# Patient Record
Sex: Male | Born: 1955
Health system: Southern US, Community
[De-identification: ages and names within clinical notes are randomized; demographics above are authoritative.]

## PROBLEM LIST (undated history)

## (undated) DIAGNOSIS — G2581 Restless legs syndrome: Secondary | ICD-10-CM

## (undated) DIAGNOSIS — M545 Low back pain: Secondary | ICD-10-CM

## (undated) DIAGNOSIS — K219 Gastro-esophageal reflux disease without esophagitis: Secondary | ICD-10-CM

## (undated) DIAGNOSIS — G4700 Insomnia, unspecified: Secondary | ICD-10-CM

## (undated) DIAGNOSIS — J329 Chronic sinusitis, unspecified: Secondary | ICD-10-CM

## (undated) DIAGNOSIS — I10 Essential (primary) hypertension: Secondary | ICD-10-CM

## (undated) DIAGNOSIS — R5383 Other fatigue: Secondary | ICD-10-CM

## (undated) DIAGNOSIS — R739 Hyperglycemia, unspecified: Secondary | ICD-10-CM

## (undated) DIAGNOSIS — E785 Hyperlipidemia, unspecified: Secondary | ICD-10-CM

## (undated) DIAGNOSIS — J3489 Other specified disorders of nose and nasal sinuses: Secondary | ICD-10-CM

## (undated) DIAGNOSIS — F419 Anxiety disorder, unspecified: Secondary | ICD-10-CM

## (undated) DIAGNOSIS — Z0000 Encounter for general adult medical examination without abnormal findings: Secondary | ICD-10-CM

## (undated) DIAGNOSIS — T7840XA Allergy, unspecified, initial encounter: Secondary | ICD-10-CM

## (undated) DIAGNOSIS — R55 Syncope and collapse: Secondary | ICD-10-CM

## (undated) DIAGNOSIS — M79672 Pain in left foot: Secondary | ICD-10-CM

## (undated) DIAGNOSIS — I8393 Asymptomatic varicose veins of bilateral lower extremities: Secondary | ICD-10-CM

## (undated) HISTORY — DX: Asymptomatic varicose veins of bilateral lower extremities: I83.93

## (undated) HISTORY — DX: Chronic sinusitis, unspecified: J32.9

## (undated) HISTORY — DX: Insomnia, unspecified: G47.00

## (undated) HISTORY — DX: Low back pain: M54.5

## (undated) HISTORY — DX: Syncope and collapse: R55

## (undated) HISTORY — DX: Anxiety disorder, unspecified: F41.9

## (undated) HISTORY — DX: Restless legs syndrome: G25.81

## (undated) HISTORY — DX: Hyperlipidemia, unspecified: E78.5

## (undated) HISTORY — DX: Gastro-esophageal reflux disease without esophagitis: K21.9

## (undated) HISTORY — DX: Essential (primary) hypertension: I10

## (undated) HISTORY — PX: PANENDOSCOPY: SHX2159

## (undated) HISTORY — DX: Encounter for general adult medical examination without abnormal findings: Z00.00

## (undated) HISTORY — DX: Allergy, unspecified, initial encounter: T78.40XA

## (undated) HISTORY — DX: Other specified disorders of nose and nasal sinuses: J34.89

## (undated) HISTORY — DX: Pain in left foot: M79.672

## (undated) HISTORY — DX: Other fatigue: R53.83

## (undated) HISTORY — PX: TONSILLECTOMY: SUR1361

## (undated) HISTORY — DX: Hyperglycemia, unspecified: R73.9

---

## 2001-02-16 ENCOUNTER — Emergency Department (HOSPITAL_COMMUNITY): Admission: EM | Admit: 2001-02-16 | Discharge: 2001-02-16 | Payer: Self-pay | Admitting: *Deleted

## 2001-02-16 ENCOUNTER — Encounter: Payer: Self-pay | Admitting: Emergency Medicine

## 2004-07-10 ENCOUNTER — Ambulatory Visit: Payer: Self-pay | Admitting: Family Medicine

## 2004-10-18 ENCOUNTER — Ambulatory Visit: Payer: Self-pay | Admitting: Internal Medicine

## 2005-01-07 ENCOUNTER — Ambulatory Visit: Payer: Self-pay | Admitting: Internal Medicine

## 2005-01-16 ENCOUNTER — Encounter (INDEPENDENT_AMBULATORY_CARE_PROVIDER_SITE_OTHER): Payer: Self-pay | Admitting: *Deleted

## 2005-01-16 ENCOUNTER — Ambulatory Visit: Payer: Self-pay | Admitting: Internal Medicine

## 2005-07-18 ENCOUNTER — Ambulatory Visit: Payer: Self-pay | Admitting: Family Medicine

## 2006-01-14 ENCOUNTER — Ambulatory Visit: Payer: Self-pay | Admitting: Family Medicine

## 2006-07-18 ENCOUNTER — Ambulatory Visit: Payer: Self-pay | Admitting: Family Medicine

## 2006-07-18 LAB — CONVERTED CEMR LAB
ALT: 28 units/L (ref 0–40)
AST: 23 units/L (ref 0–37)
Albumin: 3.7 g/dL (ref 3.5–5.2)
Alkaline Phosphatase: 43 units/L (ref 39–117)
BUN: 10 mg/dL (ref 6–23)
Basophils Absolute: 0.1 10*3/uL (ref 0.0–0.1)
Basophils Relative: 0.8 % (ref 0.0–1.0)
CO2: 27 meq/L (ref 19–32)
Calcium: 9.3 mg/dL (ref 8.4–10.5)
Chloride: 105 meq/L (ref 96–112)
Chol/HDL Ratio, serum: 4.7
Cholesterol: 198 mg/dL (ref 0–200)
Creatinine, Ser: 1.2 mg/dL (ref 0.4–1.5)
Eosinophil percent: 4.6 % (ref 0.0–5.0)
GFR calc non Af Amer: 68 mL/min
Glomerular Filtration Rate, Af Am: 82 mL/min/{1.73_m2}
Glucose, Bld: 114 mg/dL — ABNORMAL HIGH (ref 70–99)
HCT: 43.4 % (ref 39.0–52.0)
HDL: 42.1 mg/dL (ref >39.0)
Hemoglobin: 15 g/dL (ref 13.0–17.0)
Hgb A1c MFr Bld: 5.4 % (ref 4.6–6.0)
LDL Cholesterol: 124 mg/dL — ABNORMAL HIGH (ref 0–99)
Lymphocytes Relative: 29 % (ref 12.0–46.0)
MCHC: 34.5 g/dL (ref 30.0–36.0)
MCV: 87 fL (ref 78.0–100.0)
Monocytes Absolute: 0.6 10*3/uL (ref 0.2–0.7)
Monocytes Relative: 8.6 % (ref 3.0–11.0)
Neutro Abs: 3.8 10*3/uL (ref 1.4–7.7)
Neutrophils Relative %: 57 % (ref 43.0–77.0)
PSA: 0.37 ng/mL (ref 0.10–4.00)
Platelets: 297 10*3/uL (ref 150–400)
Potassium: 4.9 meq/L (ref 3.5–5.1)
RBC: 4.99 M/uL (ref 4.22–5.81)
RDW: 13.1 % (ref 11.5–14.6)
Sodium: 139 meq/L (ref 135–145)
TSH: 0.66 microintl units/mL (ref 0.35–5.50)
Total Bilirubin: 0.9 mg/dL (ref 0.3–1.2)
Total Protein: 6.3 g/dL (ref 6.0–8.3)
Triglyceride fasting, serum: 160 mg/dL — ABNORMAL HIGH (ref 0–149)
VLDL: 32 mg/dL (ref 0–40)
WBC: 6.7 10*3/uL (ref 4.5–10.5)

## 2006-07-24 ENCOUNTER — Ambulatory Visit: Payer: Self-pay | Admitting: Family Medicine

## 2006-09-09 ENCOUNTER — Ambulatory Visit: Payer: Self-pay | Admitting: Family Medicine

## 2006-09-11 ENCOUNTER — Ambulatory Visit: Payer: Self-pay | Admitting: Family Medicine

## 2006-10-21 ENCOUNTER — Ambulatory Visit: Payer: Self-pay | Admitting: Family Medicine

## 2007-04-10 DIAGNOSIS — I10 Essential (primary) hypertension: Secondary | ICD-10-CM | POA: Insufficient documentation

## 2007-04-28 ENCOUNTER — Telehealth: Payer: Self-pay | Admitting: Family Medicine

## 2007-06-24 ENCOUNTER — Ambulatory Visit: Payer: Self-pay | Admitting: Family Medicine

## 2007-06-24 LAB — CONVERTED CEMR LAB
Bilirubin Urine: NEGATIVE
Glucose, Urine, Semiquant: 100
Ketones, urine, test strip: NEGATIVE
Nitrite: NEGATIVE
Protein, U semiquant: NEGATIVE
Specific Gravity, Urine: 1.015
Urobilinogen, UA: 0.2
WBC Urine, dipstick: NEGATIVE
pH: 5

## 2007-07-21 ENCOUNTER — Ambulatory Visit: Payer: Self-pay | Admitting: Family Medicine

## 2007-07-21 DIAGNOSIS — M129 Arthropathy, unspecified: Secondary | ICD-10-CM | POA: Insufficient documentation

## 2007-07-21 DIAGNOSIS — J309 Allergic rhinitis, unspecified: Secondary | ICD-10-CM | POA: Insufficient documentation

## 2007-07-21 DIAGNOSIS — R51 Headache: Secondary | ICD-10-CM

## 2007-07-21 DIAGNOSIS — R519 Headache, unspecified: Secondary | ICD-10-CM | POA: Insufficient documentation

## 2007-07-21 LAB — CONVERTED CEMR LAB
ALT: 26 units/L (ref 0–53)
AST: 20 units/L (ref 0–37)
Albumin: 3.9 g/dL (ref 3.5–5.2)
Alkaline Phosphatase: 46 units/L (ref 39–117)
BUN: 11 mg/dL (ref 6–23)
Basophils Absolute: 0.1 10*3/uL (ref 0.0–0.1)
Basophils Relative: 0.9 % (ref 0.0–1.0)
Bilirubin, Direct: 0.2 mg/dL (ref 0.0–0.3)
CO2: 28 meq/L (ref 19–32)
Calcium: 9.4 mg/dL (ref 8.4–10.5)
Chloride: 104 meq/L (ref 96–112)
Cholesterol: 174 mg/dL (ref 0–200)
Creatinine, Ser: 1.1 mg/dL (ref 0.4–1.5)
Eosinophils Absolute: 0.4 10*3/uL (ref 0.0–0.6)
Eosinophils Relative: 5.7 % — ABNORMAL HIGH (ref 0.0–5.0)
GFR calc Af Amer: 90 mL/min
GFR calc non Af Amer: 75 mL/min
Glucose, Bld: 108 mg/dL — ABNORMAL HIGH (ref 70–99)
HCT: 41.3 % (ref 39.0–52.0)
HDL: 38.1 mg/dL — ABNORMAL LOW (ref >39.0)
Hemoglobin: 14.1 g/dL (ref 13.0–17.0)
LDL Cholesterol: 113 mg/dL — ABNORMAL HIGH (ref 0–99)
Lymphocytes Relative: 31.2 % (ref 12.0–46.0)
MCHC: 34.1 g/dL (ref 30.0–36.0)
MCV: 85.7 fL (ref 78.0–100.0)
Monocytes Absolute: 0.6 10*3/uL (ref 0.2–0.7)
Monocytes Relative: 8.3 % (ref 3.0–11.0)
Neutro Abs: 4.3 10*3/uL (ref 1.4–7.7)
Neutrophils Relative %: 53.9 % (ref 43.0–77.0)
PSA: 0.45 ng/mL (ref 0.10–4.00)
Platelets: 261 10*3/uL (ref 150–400)
Potassium: 4.4 meq/L (ref 3.5–5.1)
RBC: 4.82 M/uL (ref 4.22–5.81)
RDW: 13 % (ref 11.5–14.6)
Sodium: 139 meq/L (ref 135–145)
TSH: 0.54 microintl units/mL (ref 0.35–5.50)
Total Bilirubin: 1 mg/dL (ref 0.3–1.2)
Total CHOL/HDL Ratio: 4.6
Total Protein: 6.2 g/dL (ref 6.0–8.3)
Triglycerides: 114 mg/dL (ref 0–149)
VLDL: 23 mg/dL (ref 0–40)
WBC: 7.8 10*3/uL (ref 4.5–10.5)

## 2007-08-07 ENCOUNTER — Telehealth: Payer: Self-pay | Admitting: Family Medicine

## 2007-09-08 ENCOUNTER — Telehealth: Payer: Self-pay | Admitting: Family Medicine

## 2007-11-11 ENCOUNTER — Ambulatory Visit: Payer: Self-pay | Admitting: Family Medicine

## 2007-11-17 ENCOUNTER — Telehealth: Payer: Self-pay | Admitting: Family Medicine

## 2008-03-11 ENCOUNTER — Encounter: Payer: Self-pay | Admitting: Family Medicine

## 2008-03-15 ENCOUNTER — Telehealth: Payer: Self-pay | Admitting: Family Medicine

## 2008-07-20 ENCOUNTER — Ambulatory Visit: Payer: Self-pay | Admitting: Family Medicine

## 2008-07-20 DIAGNOSIS — G4700 Insomnia, unspecified: Secondary | ICD-10-CM

## 2008-07-20 LAB — CONVERTED CEMR LAB
Bilirubin Urine: NEGATIVE
Blood in Urine, dipstick: NEGATIVE
Glucose, Urine, Semiquant: NEGATIVE
Ketones, urine, test strip: NEGATIVE
Nitrite: NEGATIVE
Protein, U semiquant: NEGATIVE
Specific Gravity, Urine: 1.015
Urobilinogen, UA: 0.2
WBC Urine, dipstick: NEGATIVE
pH: 6.5

## 2008-07-24 LAB — CONVERTED CEMR LAB
ALT: 24 units/L (ref 0–53)
AST: 18 units/L (ref 0–37)
Albumin: 4.1 g/dL (ref 3.5–5.2)
Alkaline Phosphatase: 43 units/L (ref 39–117)
BUN: 14 mg/dL (ref 6–23)
Basophils Absolute: 0 10*3/uL (ref 0.0–0.1)
Basophils Relative: 0.2 % (ref 0.0–3.0)
Bilirubin, Direct: 0.1 mg/dL (ref 0.0–0.3)
CO2: 31 meq/L (ref 19–32)
Calcium: 9.4 mg/dL (ref 8.4–10.5)
Chloride: 104 meq/L (ref 96–112)
Cholesterol: 189 mg/dL (ref 0–200)
Creatinine, Ser: 0.8 mg/dL (ref 0.4–1.5)
Eosinophils Absolute: 0.3 10*3/uL (ref 0.0–0.7)
Eosinophils Relative: 4.7 % (ref 0.0–5.0)
GFR calc Af Amer: 130 mL/min
GFR calc non Af Amer: 107 mL/min
Glucose, Bld: 77 mg/dL (ref 70–99)
HCT: 43.1 % (ref 39.0–52.0)
HDL: 45.1 mg/dL (ref >39.0)
Hemoglobin: 15 g/dL (ref 13.0–17.0)
LDL Cholesterol: 107 mg/dL — ABNORMAL HIGH (ref 0–99)
Lymphocytes Relative: 33.6 % (ref 12.0–46.0)
MCHC: 34.8 g/dL (ref 30.0–36.0)
MCV: 84.1 fL (ref 78.0–100.0)
Monocytes Absolute: 0.6 10*3/uL (ref 0.1–1.0)
Monocytes Relative: 8.2 % (ref 3.0–12.0)
Neutro Abs: 3.7 10*3/uL (ref 1.4–7.7)
Neutrophils Relative %: 53.3 % (ref 43.0–77.0)
PSA: 0.49 ng/mL (ref 0.10–4.00)
Platelets: 220 10*3/uL (ref 150–400)
Potassium: 3.9 meq/L (ref 3.5–5.1)
RBC: 5.12 M/uL (ref 4.22–5.81)
RDW: 13.1 % (ref 11.5–14.6)
Sodium: 141 meq/L (ref 135–145)
TSH: 0.5 microintl units/mL (ref 0.35–5.50)
Total Bilirubin: 1.1 mg/dL (ref 0.3–1.2)
Total CHOL/HDL Ratio: 4.2
Total Protein: 6.7 g/dL (ref 6.0–8.3)
Triglycerides: 183 mg/dL — ABNORMAL HIGH (ref 0–149)
VLDL: 37 mg/dL (ref 0–40)
Vit D, 1,25-Dihydroxy: 27 — ABNORMAL LOW (ref 30–89)
WBC: 6.9 10*3/uL (ref 4.5–10.5)

## 2009-01-17 ENCOUNTER — Ambulatory Visit: Payer: Self-pay | Admitting: Family Medicine

## 2009-02-07 ENCOUNTER — Ambulatory Visit: Payer: Self-pay | Admitting: Family Medicine

## 2009-02-07 DIAGNOSIS — M5430 Sciatica, unspecified side: Secondary | ICD-10-CM | POA: Insufficient documentation

## 2009-05-18 ENCOUNTER — Encounter (INDEPENDENT_AMBULATORY_CARE_PROVIDER_SITE_OTHER): Payer: Self-pay | Admitting: *Deleted

## 2009-05-22 ENCOUNTER — Encounter: Payer: Self-pay | Admitting: Family Medicine

## 2009-06-02 ENCOUNTER — Telehealth: Payer: Self-pay | Admitting: *Deleted

## 2009-07-25 ENCOUNTER — Ambulatory Visit: Payer: Self-pay | Admitting: Family Medicine

## 2009-07-25 DIAGNOSIS — R079 Chest pain, unspecified: Secondary | ICD-10-CM | POA: Insufficient documentation

## 2009-07-25 DIAGNOSIS — R05 Cough: Secondary | ICD-10-CM | POA: Insufficient documentation

## 2009-08-16 ENCOUNTER — Encounter: Payer: Self-pay | Admitting: Family Medicine

## 2009-08-16 ENCOUNTER — Ambulatory Visit: Payer: Self-pay

## 2009-08-16 ENCOUNTER — Ambulatory Visit (HOSPITAL_COMMUNITY): Admission: RE | Admit: 2009-08-16 | Discharge: 2009-08-16 | Payer: Self-pay | Admitting: Family Medicine

## 2009-08-16 ENCOUNTER — Ambulatory Visit: Payer: Self-pay | Admitting: Cardiology

## 2010-02-20 ENCOUNTER — Ambulatory Visit: Payer: Self-pay | Admitting: Family Medicine

## 2010-02-20 ENCOUNTER — Telehealth: Payer: Self-pay | Admitting: Family Medicine

## 2010-02-20 DIAGNOSIS — J0100 Acute maxillary sinusitis, unspecified: Secondary | ICD-10-CM

## 2010-02-20 DIAGNOSIS — H6690 Otitis media, unspecified, unspecified ear: Secondary | ICD-10-CM | POA: Insufficient documentation

## 2010-05-14 ENCOUNTER — Ambulatory Visit: Payer: Self-pay | Admitting: Family Medicine

## 2010-05-14 DIAGNOSIS — L03019 Cellulitis of unspecified finger: Secondary | ICD-10-CM

## 2010-05-14 DIAGNOSIS — L02519 Cutaneous abscess of unspecified hand: Secondary | ICD-10-CM | POA: Insufficient documentation

## 2010-06-29 ENCOUNTER — Telehealth: Payer: Self-pay | Admitting: Family Medicine

## 2010-08-26 LAB — CONVERTED CEMR LAB
ALT: 32 units/L (ref 0–53)
AST: 25 units/L (ref 0–37)
Albumin: 4.4 g/dL (ref 3.5–5.2)
Alkaline Phosphatase: 43 units/L (ref 39–117)
BUN: 19 mg/dL (ref 6–23)
Basophils Absolute: 0.1 10*3/uL (ref 0.0–0.1)
Basophils Relative: 0.8 % (ref 0.0–3.0)
Bilirubin Urine: NEGATIVE
Bilirubin, Direct: 0.2 mg/dL (ref 0.0–0.3)
CO2: 29 meq/L (ref 19–32)
Calcium: 9.7 mg/dL (ref 8.4–10.5)
Chloride: 104 meq/L (ref 96–112)
Cholesterol: 209 mg/dL — ABNORMAL HIGH (ref 0–200)
Creatinine, Ser: 1.1 mg/dL (ref 0.4–1.5)
Direct LDL: 116.1 mg/dL
Eosinophils Absolute: 0.3 10*3/uL (ref 0.0–0.7)
Eosinophils Relative: 4.2 % (ref 0.0–5.0)
GFR calc non Af Amer: 73.88 mL/min (ref >60)
Glucose, Bld: 111 mg/dL — ABNORMAL HIGH (ref 70–99)
Glucose, Urine, Semiquant: NEGATIVE
HCT: 43.9 % (ref 39.0–52.0)
HDL: 53.1 mg/dL (ref >39.00)
Hemoglobin: 14.9 g/dL (ref 13.0–17.0)
Ketones, urine, test strip: NEGATIVE
Lymphocytes Relative: 31 % (ref 12.0–46.0)
Lymphs Abs: 2.2 10*3/uL (ref 0.7–4.0)
MCHC: 34 g/dL (ref 30.0–36.0)
MCV: 85.8 fL (ref 78.0–100.0)
Monocytes Absolute: 0.6 10*3/uL (ref 0.1–1.0)
Monocytes Relative: 8.8 % (ref 3.0–12.0)
Neutro Abs: 4 10*3/uL (ref 1.4–7.7)
Neutrophils Relative %: 55.2 % (ref 43.0–77.0)
Nitrite: NEGATIVE
PSA: 0.46 ng/mL (ref 0.10–4.00)
Platelets: 227 10*3/uL (ref 150.0–400.0)
Potassium: 4.7 meq/L (ref 3.5–5.1)
Protein, U semiquant: NEGATIVE
RBC: 5.11 M/uL (ref 4.22–5.81)
RDW: 13.1 % (ref 11.5–14.6)
Sodium: 141 meq/L (ref 135–145)
Specific Gravity, Urine: 1.015
TSH: 0.58 microintl units/mL (ref 0.35–5.50)
Total Bilirubin: 1 mg/dL (ref 0.3–1.2)
Total CHOL/HDL Ratio: 4
Total Protein: 7 g/dL (ref 6.0–8.3)
Triglycerides: 210 mg/dL — ABNORMAL HIGH (ref 0.0–149.0)
Urobilinogen, UA: 0.2
VLDL: 42 mg/dL — ABNORMAL HIGH (ref 0.0–40.0)
Vit D, 25-Hydroxy: 49 ng/mL (ref 30–89)
WBC Urine, dipstick: NEGATIVE
WBC: 7.2 10*3/uL (ref 4.5–10.5)
pH: 6

## 2010-08-28 NOTE — Assessment & Plan Note (Signed)
Summary: sinus inf/njr   Vital Signs:  Patient profile:   55 year old male Weight:      195 pounds O2 Sat:      98 % Temp:     98.3 degrees F Pulse rate:   98 / minute Pulse rhythm:   regular BP sitting:   120 / 90  (left arm)  Vitals Entered By: Pura Spice, RN (February 20, 2010 2:36 PM) CC: sinus infection    History of Present Illness: district 55-year-old white married male is in full for complaints of nasal congestion facial pain and pain in her right ear no sore throat no coughing No other complaint Hypertension well-controlled at 120/90  Allergies (verified): No Known Drug Allergies  Past History:  Past Medical History: Last updated: 07/20/2008 Hypertension ekg   2007 eye exam colonscopy   2006   repeat  2016 DT    2006 PNEUNOVAX SHINGLES VACCINE SMOKER    never    Past Surgical History: Last updated: 04/10/2007 Tonsillectomy Panendoscopy  Social History: Last updated: 04/10/2007 Occupation: Married Never Smoked Alcohol use-no Drug use-no Regular exercise-yes  Risk Factors: Smoking Status: never (04/10/2007)  Review of Systems      See HPI  The patient denies anorexia, fever, weight loss, weight gain, vision loss, decreased hearing, hoarseness, chest pain, syncope, dyspnea on exertion, peripheral edema, prolonged cough, headaches, hemoptysis, abdominal pain, melena, hematochezia, severe indigestion/heartburn, hematuria, incontinence, genital sores, muscle weakness, suspicious skin lesions, transient blindness, difficulty walking, depression, unusual weight change, abnormal bleeding, enlarged lymph nodes, angioedema, breast masses, and testicular masses.    Physical Exam  General:  Well-developed,well-nourished,in no acute distress; alert,appropriate and cooperative throughout examination Head:  Normocephalic and atraumatic without obvious abnormalities. No apparent alopecia or balding. Eyes:  No corneal or conjunctival inflammation noted. EOMI.  Perrla. Funduscopic exam benign, without hemorrhages, exudates or papilledema. Vision grossly normal. Ears:  right TM dull erythematous Left ear negative Nose:  nasal congestion erythematous mucosa with yellow drainage tenderness over back for sinuses Mouth:  Oral mucosa and oropharynx without lesions or exudates.  Teeth in good repair. Lungs:  Normal respiratory effort, chest expands symmetrically. Lungs are clear to auscultation, no crackles or wheezes. Heart:  Normal rate and regular rhythm. S1 and S2 normal without gallop, murmur, click, rub or other extra sounds.   Impression & Recommendations:  Problem # 1:  ACUTE MAXILLARY SINUSITIS (ICD-461.0) Assessment New  His updated medication list for this problem includes:    Allegra-d 12 Hour 60-120 Mg Tb12 (Fexofenadine-pseudoephedrine) .Marland Kitchen... 1 tab every 12 hours as needed for nasal congestion    Hydrocodone-homatropine 5-1.5 Mg/8ml Syrp (Hydrocodone-homatropine) .Marland Kitchen... 1-2  tsp q4h as needed cough    Augmentin 875-125 Mg Tabs (Amoxicillin-pot clavulanate) .Marland Kitchen... 1 bid  Problem # 2:  OTITIS MEDIA (ICD-382.9) Assessment: New  His updated medication list for this problem includes:    Adult Aspirin Low Strength 81 Mg Tbdp (Aspirin)    Augmentin 875-125 Mg Tabs (Amoxicillin-pot clavulanate) .Marland Kitchen... 1 bid  Problem # 3:  HYPERTENSION (ICD-401.9) Assessment: Improved  His updated medication list for this problem includes:    Propranolol Hcl 40 Mg Tabs (Propranolol hcl) .Marland Kitchen... 1 by mouth once daily    Lisinopril 10 Mg Tabs (Lisinopril) .Marland Kitchen... 1 once daily for b:p  Problem # 4:  ARTHRITIS (ICD-716.90) Assessment: Unchanged  Complete Medication List: 1)  Adult Aspirin Low Strength 81 Mg Tbdp (Aspirin) 2)  Zolpidem Tartrate 10 Mg Tabs (Zolpidem tartrate) .... One at bedtime as needed  sleep 3)  Allegra-d 12 Hour 60-120 Mg Tb12 (Fexofenadine-pseudoephedrine) .Marland Kitchen.. 1 tab every 12 hours as needed for nasal congestion 4)  Kls Omperazole 20 Mg Tbec  (Omeprazole) .Marland Kitchen.. 1 capsule daily 5)  Propranolol Hcl 40 Mg Tabs (Propranolol hcl) .Marland Kitchen.. 1 by mouth once daily 6)  Lisinopril 10 Mg Tabs (Lisinopril) .Marland Kitchen.. 1 once daily for b:p 7)  Oxycodone-acetaminophen 10-650 Mg Tabs (Oxycodone-acetaminophen) .Marland Kitchen.. 1 q4h as needed pain 8)  Hydrocodone-homatropine 5-1.5 Mg/49ml Syrp (Hydrocodone-homatropine) .Marland Kitchen.. 1-2  tsp q4h as needed cough 9)  Hydrocodone-acetaminophen 10-650 Mg Tabs (Hydrocodone-acetaminophen) .Marland Kitchen.. 1 q4hy as needed pain 10)  Augmentin 875-125 Mg Tabs (Amoxicillin-pot clavulanate) .Marland Kitchen.. 1 bid  Patient Instructions: 1)  diagnosis otitis media right ear as well as acute maxillary sinusitis 2)  Next Allegra-D b.i.d. as Instructed 3)  Augmentin 4 infection 4)  Good fluid intake Prescriptions: AUGMENTIN 875-125 MG TABS (AMOXICILLIN-POT CLAVULANATE) 1 bid  #20 x 0   Entered and Authorized by:   Judithann Sheen MD   Signed by:   Judithann Sheen MD on 02/20/2010   Method used:   Electronically to        [facility] (retail)       [address]       [place], [place]  [address]       Ph: [phone] or [phone]       Fax: [phone]   RxID:   [id number] HYDROCODONE-ACETAMINOPHEN 10-650 MG TABS (HYDROCODONE-ACETAMINOPHEN) 1 q4hy as needed pain  #100 x 5   Entered and Authorized by:   Judithann Sheen MD   Signed by:   Judithann Sheen MD on 02/20/2010   Method used:   Print then Give to Patient   RxID:   [id number] OXYCODONE-ACETAMINOPHEN 10-650 MG TABS (OXYCODONE-ACETAMINOPHEN) 1 q4h as needed pain  #100 x 0   Entered and Authorized by:   Judithann Sheen MD   Signed by:   Judithann Sheen MD on 02/20/2010   Method used:   Print then Give to Patient   RxID:   [id number]

## 2010-08-28 NOTE — Assessment & Plan Note (Signed)
Summary: HAND INFECTION? (LAC TO THUMB) // RS   Vital Signs:  Patient profile:   55 year old male Height:      72.5 inches (184.15 cm) Weight:      199 pounds (90.45 kg) BMI:     26.71 O2 Sat:      96 % on Room air Temp:     98.1 degrees F (36.72 degrees C) oral Pulse rate:   87 / minute BP sitting:   120 / 80  (left arm) Cuff size:   regular  Vitals Entered By: Josph Macho RMA (May 14, 2010 3:40 PM)  O2 Flow:  Room air CC: Hand infection Left thumb X7 days/ CF Is Patient Diabetic? No   History of Present Illness: Patient is a 55 year old caucasian, nonsmoking male who is in today for evaluation of a laceration on his left thumb. 7 days ago he cut his hand with a kitchen knife while trying to cut a bagel. He did not seek medical care but instead cleaned it with alcohol and peroxide. He has been keeping it covered with antibiotic ointment and bandage and it had been doing well until last night he reports it started to hurt more. The throbbing kept him up even after his Hydrpcodone. Questionable low grade fevers over night. Today it no longer hurts, no fevers and he worked all day although he did note some mild discharge. No Malaise, myalgias, chills, anorexia, CP/palp/SOB or spreading of the area of irritation. No numbness or weakness in hand  Current Medications (verified): 1)  Adult Aspirin Low Strength 81 Mg  Tbdp (Aspirin) 2)  Zolpidem Tartrate 10 Mg  Tabs (Zolpidem Tartrate) .... One At Bedtime As Needed Sleep 3)  Allegra-D 12 Hour 60-120 Mg  Tb12 (Fexofenadine-Pseudoephedrine) .Marland Kitchen.. 1 Tab Every 12 Hours As Needed For Nasal Congestion 4)  Kls Omperazole 20 Mg  Tbec (Omeprazole) .Marland Kitchen.. 1 Capsule Daily 5)  Propranolol Hcl 40 Mg  Tabs (Propranolol Hcl) .Marland Kitchen.. 1 By Mouth Once Daily 6)  Lisinopril 10 Mg Tabs (Lisinopril) .Marland Kitchen.. 1 Once Daily For B:p 7)  Oxycodone-Acetaminophen 10-650 Mg Tabs (Oxycodone-Acetaminophen) .Marland Kitchen.. 1 Q4h As Needed Pain 8)  Hydrocodone-Homatropine 5-1.5 Mg/3ml  Syrp (Hydrocodone-Homatropine) .Marland Kitchen.. 1-2  Tsp Q4h As Needed Cough 9)  Hydrocodone-Acetaminophen 10-650 Mg Tabs (Hydrocodone-Acetaminophen) .Marland Kitchen.. 1 Q4hy As Needed Pain  Allergies (verified): No Known Drug Allergies  Past History:  Past medical history reviewed for relevance to current acute and chronic problems. Social history (including risk factors) reviewed for relevance to current acute and chronic problems.  Past Medical History: Reviewed history from 07/20/2008 and no changes required. Hypertension ekg   2007 eye exam colonscopy   2006   repeat  2016 DT    2006 PNEUNOVAX SHINGLES VACCINE SMOKER    never    Social History: Reviewed history from 04/10/2007 and no changes required. Occupation: Married Never Smoked Alcohol use-no Drug use-no Regular exercise-yes  Review of Systems      See HPI  Physical Exam  General:  Well-developed,well-nourished,in no acute distress; alert,appropriate and cooperative throughout examination Head:  Normocephalic and atraumatic without obvious abnormalities. No apparent alopecia or balding. Neck:  No deformities, masses, or tenderness noted. Lungs:  Normal respiratory effort, chest expands symmetrically. Lungs are clear to auscultation, no crackles or wheezes. Heart:  Normal rate and regular rhythm. S1 and S2 normal without gallop, murmur, click, rub or other extra sounds. Abdomen:  Bowel sounds positive,abdomen soft and non-tender without masses, organomegaly or hernias noted. Extremities:  No clubbing, cyanosis, edema, or deformity noted with normal full range of motion of all joints.   Skin:  left thumb with a cut over his knuckle, small amount of pus around edges. Nontender,  no surrounding erythema, No numbness, weakness in thumb. nailbed pink, perfusing well to tipof thumb. Psych:  Cognition and judgment appear intact. Alert and cooperative with normal attention span and concentration. No apparent delusions, illusions,  hallucinations   Impression & Recommendations:  Problem # 1:  UNSPECIFIED CELLULITIS AND ABSCESS OF FINGER (ICD-681.00)  The following medications were removed from the medication list:    Augmentin 875-125 Mg Tabs (Amoxicillin-pot clavulanate) .Marland Kitchen... 1 bid His updated medication list for this problem includes:    Levaquin 500 Mg Tabs (Levofloxacin) .Marland Kitchen... 1 tab by mouth daily x 10 days  Orders: Orthopedic Surgeon Referral (Ortho Surgeon) Left thumb laceration, asked to soak in H202 and water two times a day and to keep warm and dry, cover with antibiotic ointment and bandaid when out. Set up consultation in case antibiotics ineffective. If lesion improves may cancel consultation. Given Tdap today  Problem # 2:  HYPERTENSION (ICD-401.9)  His updated medication list for this problem includes:    Propranolol Hcl 40 Mg Tabs (Propranolol hcl) .Marland Kitchen... 1/2 tab by mouth two times a day    Lisinopril 10 Mg Tabs (Lisinopril) .Marland Kitchen... 1 by mouth two times a day  Problem # 3:  ALLERGIC RHINITIS (ICD-477.9)  His updated medication list for this problem includes:    Flonase 50 Mcg/act Susp (Fluticasone propionate) .Marland Kitchen... 2 sprays each nostril daily    Fexofenadine Hcl 180 Mg Tabs (Fexofenadine hcl) .Marland Kitchen... 1 tab by mouth daily report worsening  symptoms, has been taking allegra D but asked to stop Sudafed due to HTN  Complete Medication List: 1)  Adult Aspirin Low Strength 81 Mg Tbdp (Aspirin) 2)  Zolpidem Tartrate 10 Mg Tabs (Zolpidem tartrate) .... One at bedtime as needed sleep 3)  Allegra-d 12 Hour 60-120 Mg Tb12 (Fexofenadine-pseudoephedrine) .Marland Kitchen.. 1 tab every 12 hours as needed for nasal congestion 4)  Kls Omperazole 20 Mg Tbec (Omeprazole) .Marland Kitchen.. 1 capsule daily 5)  Propranolol Hcl 40 Mg Tabs (Propranolol hcl) .... 1/2 tab by mouth two times a day 6)  Lisinopril 10 Mg Tabs (Lisinopril) .Marland Kitchen.. 1 by mouth two times a day 7)  Oxycodone-acetaminophen 10-650 Mg Tabs (Oxycodone-acetaminophen) .Marland Kitchen.. 1 q4h as  needed pain 8)  Hydrocodone-homatropine 5-1.5 Mg/83ml Syrp (Hydrocodone-homatropine) .Marland Kitchen.. 1-2  tsp q4h as needed cough 9)  Hydrocodone-acetaminophen 10-650 Mg Tabs (Hydrocodone-acetaminophen) .Marland Kitchen.. 1 q4hy as needed pain 10)  Levaquin 500 Mg Tabs (Levofloxacin) .Marland Kitchen.. 1 tab by mouth daily x 10 days 11)  Naprosyn 375 Mg Tabs (Naproxen) .Marland Kitchen.. 1 tab by mouth two times a day as needed pain with food 12)  Flonase 50 Mcg/act Susp (Fluticasone propionate) .... 2 sprays each nostril daily 13)  Fexofenadine Hcl 180 Mg Tabs (Fexofenadine hcl) .Marland Kitchen.. 1 tab by mouth daily  Patient Instructions: 1)  Take your antibiotic as prescribed until ALL of it is gone, but stop if you develop a rash or swelling and contact our office as soon as possible.  2)  Please schedule a follow-up appointment as needed if symptoms worsen or do not improve, we have arranged an orthopaedic consult  in case the infection worsens to have this debrided, seek immediate medical care if hi grade fever, spreading lesions or increasing pain. 3)  Soak lesion in hydrogen peroxide and hot water two times a day  and keep open to air when home and resting and cover with antibiotic ointment and sterile bandage when out. Prescriptions: FEXOFENADINE HCL 180 MG TABS (FEXOFENADINE HCL) 1 tab by mouth daily  #30 x 3   Entered and Authorized by:   Danise Edge MD   Signed by:   Danise Edge MD on 05/14/2010   Method used:   Electronically to        [facility]* (retail)       [address]       [place], [place]  [address]       Ph: [phone]       Fax: [phone]   RxID:   [id number] FLONASE 50 MCG/ACT SUSP (FLUTICASONE PROPIONATE) 2 sprays each nostril daily  #1 x 3   Entered and Authorized by:   Danise Edge MD   Signed by:   Danise Edge MD on 05/14/2010   Method used:   Electronically to        [facility]* (retail)       [address]       [place], [place]  [address]       Ph: [phone]       Fax: [phone]   RxID:    [id number] LISINOPRIL 10 MG TABS (LISINOPRIL) 1 by mouth two times a day  #60 x 2   Entered and Authorized by:   Danise Edge MD   Signed by:   Danise Edge MD on 05/14/2010   Method used:   Electronically to        [facility]* (retail)       [address]       [place], [place]  [address]       Ph: [phone]       Fax: [phone]   RxID:   [id number] NAPROSYN 375 MG TABS (NAPROXEN) 1 tab by mouth two times a day as needed pain with food  #60 x 2   Entered and Authorized by:   Danise Edge MD   Signed by:   Danise Edge MD on 05/14/2010   Method used:   Electronically to        [facility]* (retail)       [address]       [place], [place]  [address]       Ph: [phone]       Fax: [phone]   RxID:   [id number] LEVAQUIN 500 MG TABS (LEVOFLOXACIN) 1 tab by mouth daily x 10 days  #10 x 0   Entered and Authorized by:   Danise Edge MD   Signed by:   Danise Edge MD on 05/14/2010   Method used:   Electronically to        [facility]* (retail)       [address]       [place], [place]  [address]       Ph: [phone]       Fax: [phone]   RxID:   [id number]    Orders Added: 1)  Orthopedic Surgeon Referral Gaylord Shih Surgeon] 2)  Est. Patient Level IV [[address]]     Appended Document: HAND INFECTION? (LAC TO THUMB) // RS         Immunizations Administered:  Tetanus Vaccine:    Vaccine Type: Tdap    Site: left deltoid    Mfr: GlaxoSmithKline    Dose: 0.5 ml    Route: IM    Given by: Josph Macho RMA    Exp. Date: 05/17/2012  Lot #: [id number]    VIS given: 06/15/08 version given May 15, 2010.

## 2010-08-28 NOTE — Progress Notes (Signed)
Summary: Sinus Infection  Phone Note Call from Patient Call back at Home Phone [phone] Call back at [phone]   Caller: Patient Summary of Call: Wants to get a prescription for amoxicillian, is having sinus infection,fever,head ache. Drugstore is [facility] on [address] Initial call taken by: Georgian Co,  February 20, 2010 9:04 AM  Follow-up for Phone Call        ov today at 3 or 4  per dr Scotty Court  Follow-up by: Pura Spice, RN,  February 20, 2010 9:42 AM  Additional Follow-up for Phone Call Additional follow up Details #1::        ov today 2pm Additional Follow-up by: Heron Sabins,  February 20, 2010 12:27 PM

## 2010-08-30 NOTE — Progress Notes (Signed)
Summary: Rx request for sinus infection  Phone Note Call from Patient   Caller: Patient Call For: Judithann Sheen MD/Suandrea Summary of Call: VM from pt requesting Rx be sent to [facility] for a sinus infection.  "Dr Scotty Court will usually call me in something". Initial call taken by: Sid Falcon LPN,  June 29, 2010 11:26 AM  Follow-up for Phone Call        to talk with patient

## 2010-10-08 ENCOUNTER — Encounter: Payer: Self-pay | Admitting: Family Medicine

## 2010-10-18 ENCOUNTER — Other Ambulatory Visit (INDEPENDENT_AMBULATORY_CARE_PROVIDER_SITE_OTHER): Payer: BC Managed Care – PPO | Admitting: Family Medicine

## 2010-10-18 DIAGNOSIS — E785 Hyperlipidemia, unspecified: Secondary | ICD-10-CM

## 2010-10-18 DIAGNOSIS — Z0000 Encounter for general adult medical examination without abnormal findings: Secondary | ICD-10-CM

## 2010-10-18 LAB — CBC WITH DIFFERENTIAL/PLATELET
Basophils Relative: 0.7 % (ref 0.0–3.0)
Eosinophils Absolute: 0.3 10*3/uL (ref 0.0–0.7)
Eosinophils Relative: 3.4 % (ref 0.0–5.0)
Hemoglobin: 14.1 g/dL (ref 13.0–17.0)
Lymphocytes Relative: 31.7 % (ref 12.0–46.0)
MCHC: 34.3 g/dL (ref 30.0–36.0)
Monocytes Relative: 8.2 % (ref 3.0–12.0)
Neutro Abs: 4.2 10*3/uL (ref 1.4–7.7)
Neutrophils Relative %: 56 % (ref 43.0–77.0)
RBC: 4.79 Mil/uL (ref 4.22–5.81)
WBC: 7.5 10*3/uL (ref 4.5–10.5)

## 2010-10-18 LAB — BASIC METABOLIC PANEL
CO2: 27 mEq/L (ref 19–32)
Calcium: 9.1 mg/dL (ref 8.4–10.5)
Creatinine, Ser: 1 mg/dL (ref 0.4–1.5)
Sodium: 135 mEq/L (ref 135–145)

## 2010-10-18 LAB — POCT URINALYSIS DIPSTICK
Glucose, UA: NEGATIVE
Ketones, UA: NEGATIVE
Leukocytes, UA: NEGATIVE
Protein, UA: NEGATIVE
Spec Grav, UA: 1.01

## 2010-10-18 LAB — HEPATIC FUNCTION PANEL
ALT: 19 U/L (ref 0–53)
AST: 18 U/L (ref 0–37)
Albumin: 4.1 g/dL (ref 3.5–5.2)
Alkaline Phosphatase: 39 U/L (ref 39–117)
Bilirubin, Direct: 0.1 mg/dL (ref 0.0–0.3)
Total Protein: 6.7 g/dL (ref 6.0–8.3)

## 2010-10-18 LAB — TSH: TSH: 0.51 u[IU]/mL (ref 0.35–5.50)

## 2010-10-18 LAB — PSA: PSA: 0.51 ng/mL (ref 0.10–4.00)

## 2010-10-18 LAB — LDL CHOLESTEROL, DIRECT: Direct LDL: 126.6 mg/dL

## 2010-10-23 ENCOUNTER — Ambulatory Visit (INDEPENDENT_AMBULATORY_CARE_PROVIDER_SITE_OTHER): Payer: BC Managed Care – PPO | Admitting: Family Medicine

## 2010-10-23 ENCOUNTER — Encounter: Payer: Self-pay | Admitting: Family Medicine

## 2010-10-23 VITALS — BP 120/90 | HR 66 | Temp 97.9°F | Ht 73.5 in | Wt 200.0 lb

## 2010-10-23 DIAGNOSIS — G4700 Insomnia, unspecified: Secondary | ICD-10-CM

## 2010-10-23 DIAGNOSIS — I10 Essential (primary) hypertension: Secondary | ICD-10-CM

## 2010-10-23 DIAGNOSIS — Z0000 Encounter for general adult medical examination without abnormal findings: Secondary | ICD-10-CM

## 2010-10-23 DIAGNOSIS — J309 Allergic rhinitis, unspecified: Secondary | ICD-10-CM

## 2010-10-23 DIAGNOSIS — R05 Cough: Secondary | ICD-10-CM

## 2010-10-23 MED ORDER — HYDROCODONE-ACETAMINOPHEN 10-650 MG PO TABS: 1.0000 | ORAL_TABLET | ORAL | Status: DC | PRN

## 2010-10-23 MED ORDER — LISINOPRIL 10 MG PO TABS: 20.0000 mg | ORAL_TABLET | Freq: Two times a day (BID) | ORAL | Status: DC

## 2010-10-23 MED ORDER — FLUTICASONE PROPIONATE 50 MCG/ACT NA SUSP: 2.0000 | Freq: Every day | NASAL | Status: DC

## 2010-10-23 MED ORDER — PROPRANOLOL HCL 40 MG PO TABS: 40.0000 mg | ORAL_TABLET | Freq: Three times a day (TID) | ORAL | Status: DC

## 2010-10-23 MED ORDER — OMEPRAZOLE 20 MG PO TBEC: 1.0000 | DELAYED_RELEASE_TABLET | Freq: Every day | ORAL | Status: DC

## 2010-10-23 NOTE — Patient Instructions (Signed)
You are in good health, continue medications as we discussed Lab studies good Return as needed or in 1 yr for physical Check BP atleast every 3 monthsby mother who  Is a nurse

## 2010-11-18 NOTE — Progress Notes (Signed)
  Subjective:    Patient ID: Taylor Mcdonald, male    DOB: 01-21-1956, 55 y.o.   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 He continues to have chronic back pain and at times needs hydrocodoneContinues to have problems with anxiety at times as well as insomniaHPI    Review of Systems  Constitutional: Negative.   HENT: Positive for congestion.   Eyes: Negative.   Respiratory: Positive for cough.   Cardiovascular: Negative.        Hypertension controlled One year ago had stress EKG with normal results  Gastrointestinal: Negative.   Genitourinary: Negative.   Musculoskeletal: Positive for back pain and arthralgias.  Skin: Negative.   Neurological: Negative.   Hematological: Negative.   Psychiatric/Behavioral: Positive for sleep disturbance.       Objective:   Physical Exam the patient is a well-developed well-nourished healthy-appearing pleasant male who has normal vital signs HEENT revealed pale boggy nasal mucosa with minimal clear drainage  carotid pulses are good thyroid nonpalpable Heart no gross cardiomegaly heart sounds go without murmurs rhythm regular no PVCs  peripheral pulses good Lungs clear to palpation percussion and auscultation Abdomen liver spleen and kidneys nonpalpable no masses felt there were percussive normal no tenderness bowel sounds normal Genitalia normal no hernia testicles normal rectal examination revealed normal size prostate no tenderness Back examination negative Extremities negative examination no edema Skin no polyps no nevi Neurological negative examination reflexes normal equal bilaterally        Assessment & Plan:  Physical examination reveals a normal healthy male with no abnormalities noted at this time except for mild allergic rhinitis cough most likely due to postnasal drainage from  allergy will prescribe Hycodan for cough as needed hypertension is controlled no change in meds Chronic back pain prescribed hydrocodone to use as needed for pain GERD continue omeprazole Insomnia continue Ambien as needed

## 2010-11-22 ENCOUNTER — Other Ambulatory Visit: Payer: Self-pay

## 2010-11-22 MED ORDER — PROPRANOLOL HCL 40 MG PO TABS: 40.0000 mg | ORAL_TABLET | Freq: Every day | ORAL | Status: DC

## 2010-11-22 MED ORDER — INDOMETHACIN 50 MG PO CAPS: ORAL_CAPSULE | ORAL | Status: DC

## 2010-11-22 NOTE — Telephone Encounter (Signed)
Received a message from the pt about medication propanolol and gout medication that he needs called in to his pharmacy

## 2011-01-03 ENCOUNTER — Telehealth: Payer: Self-pay | Admitting: Family Medicine

## 2011-01-03 MED ORDER — LISINOPRIL 20 MG PO TABS: 20.0000 mg | ORAL_TABLET | Freq: Every day | ORAL | Status: DC

## 2011-01-03 NOTE — Telephone Encounter (Signed)
Pt called and said that Dr Scotty Court had changed dosage for Lisinopril 10 mg bid, but this causes pt to run out early and pharmacy will not refill. Pt is req that Dr Scotty Court rewrite script for Lisinopril 20 mg 1 per day, so pt could just cut pill in 1/2. Pls call in new script to [facility] [address]

## 2011-01-04 NOTE — Telephone Encounter (Signed)
Done

## 2011-02-26 ENCOUNTER — Ambulatory Visit (INDEPENDENT_AMBULATORY_CARE_PROVIDER_SITE_OTHER): Payer: BC Managed Care – PPO | Admitting: Family Medicine

## 2011-02-26 ENCOUNTER — Encounter: Payer: Self-pay | Admitting: Family Medicine

## 2011-02-26 ENCOUNTER — Ambulatory Visit (INDEPENDENT_AMBULATORY_CARE_PROVIDER_SITE_OTHER)
Admission: RE | Admit: 2011-02-26 | Discharge: 2011-02-26 | Disposition: A | Discharge status: Home / Self Care | Payer: BC Managed Care – PPO | Source: Ambulatory Visit | Attending: Family Medicine | Admitting: Family Medicine

## 2011-02-26 VITALS — BP 118/88 | Temp 98.6°F | Wt 183.0 lb

## 2011-02-26 DIAGNOSIS — M109 Gout, unspecified: Secondary | ICD-10-CM

## 2011-02-26 DIAGNOSIS — R0609 Other forms of dyspnea: Secondary | ICD-10-CM

## 2011-02-26 DIAGNOSIS — R5383 Other fatigue: Secondary | ICD-10-CM

## 2011-02-26 DIAGNOSIS — R0989 Other specified symptoms and signs involving the circulatory and respiratory systems: Secondary | ICD-10-CM

## 2011-02-26 DIAGNOSIS — E538 Deficiency of other specified B group vitamins: Secondary | ICD-10-CM

## 2011-02-26 DIAGNOSIS — R5381 Other malaise: Secondary | ICD-10-CM

## 2011-02-26 DIAGNOSIS — I10 Essential (primary) hypertension: Secondary | ICD-10-CM

## 2011-02-26 LAB — POCT URINALYSIS DIPSTICK
Ketones, UA: NEGATIVE
Leukocytes, UA: NEGATIVE
Protein, UA: NEGATIVE
Spec Grav, UA: 1.015
Urobilinogen, UA: 0.2
pH, UA: 5

## 2011-02-26 LAB — CBC WITH DIFFERENTIAL/PLATELET
Basophils Absolute: 0.1 10*3/uL (ref 0.0–0.1)
Basophils Relative: 0.7 % (ref 0.0–3.0)
Eosinophils Absolute: 0.2 10*3/uL (ref 0.0–0.7)
Lymphocytes Relative: 34.2 % (ref 12.0–46.0)
MCHC: 33.5 g/dL (ref 30.0–36.0)
MCV: 87.1 fl (ref 78.0–100.0)
Monocytes Absolute: 0.7 10*3/uL (ref 0.1–1.0)
Neutrophils Relative %: 55.3 % (ref 43.0–77.0)
RBC: 5.12 Mil/uL (ref 4.22–5.81)
RDW: 14.2 % (ref 11.5–14.6)

## 2011-02-26 LAB — BASIC METABOLIC PANEL
BUN: 15 mg/dL (ref 6–23)
CO2: 27 mEq/L (ref 19–32)
Calcium: 9.3 mg/dL (ref 8.4–10.5)
Chloride: 102 mEq/L (ref 96–112)
Creatinine, Ser: 1.1 mg/dL (ref 0.4–1.5)
Glucose, Bld: 101 mg/dL — ABNORMAL HIGH (ref 70–99)

## 2011-02-26 NOTE — Patient Instructions (Addendum)
Since you have  fatigue and tiredness we will check complete blood count iron level also we would check a testosterone level to cover moods of symptoms that we discussed. As far as the shortness of breath am also going get a chest x-ray and if the lab studies and chest x-ray do not help as far as the shortness of breath we will repeat the stress test I will call you just as soon as I get the results Increase the lisinopril to 20 mg each day and change the propranolol to 20 mg in the a.m. 20 mg at night next her blood pressure on a regular basis and if any question please call For the chest x-ray go to [facility] on [address] between 8:30 and 4:30., No appointment is needed

## 2011-02-27 ENCOUNTER — Ambulatory Visit (INDEPENDENT_AMBULATORY_CARE_PROVIDER_SITE_OTHER): Payer: BC Managed Care – PPO | Admitting: Family Medicine

## 2011-02-27 ENCOUNTER — Other Ambulatory Visit: Payer: Self-pay | Admitting: Family Medicine

## 2011-02-27 DIAGNOSIS — E538 Deficiency of other specified B group vitamins: Secondary | ICD-10-CM

## 2011-02-27 MED ORDER — CYANOCOBALAMIN 1000 MCG/ML IJ SOLN
1000.0000 ug | Freq: Once | INTRAMUSCULAR | Status: AC
  Administered 2011-02-27: 1000 ug via INTRAMUSCULAR

## 2011-02-27 MED ORDER — TESTOSTERONE 20.25 MG/ACT (1.62%) TD GEL: 2.0000 | Freq: Every day | TRANSDERMAL | Status: DC

## 2011-02-27 NOTE — Telephone Encounter (Signed)
Pt aware of labs results.  Pt is scheduled for a b12 inj on 02/27/11 at 4pm and his chest x-ray will be dicussed as well.

## 2011-02-27 NOTE — Telephone Encounter (Signed)
Pt called 8/1. Saw Dr. Scotty Court yesterday and was expecting his call today regarding lab & x-ray results. Pt was just checking to see if Dr. Kathie Rhodes was going to call him. Please call when possible.

## 2011-02-27 NOTE — Progress Notes (Signed)
Quick Note:  Pt aware ______ 

## 2011-02-28 ENCOUNTER — Ambulatory Visit: Payer: BC Managed Care – PPO | Admitting: Family Medicine

## 2011-03-04 NOTE — Progress Notes (Signed)
Pt aware.

## 2011-03-13 ENCOUNTER — Other Ambulatory Visit: Payer: Self-pay | Admitting: Family Medicine

## 2011-03-13 DIAGNOSIS — M545 Low back pain: Secondary | ICD-10-CM

## 2011-03-14 ENCOUNTER — Encounter: Payer: Self-pay | Admitting: Family Medicine

## 2011-03-14 NOTE — Progress Notes (Signed)
  Subjective:    Patient ID: Taylor Mcdonald, male    DOB: 09-26-1955, 55 y.o.   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 Blood pressure has been well-controlled in fact runs low at times today is 118/88, to change the dosage on propranolol to 20 mg twice a day continues to have some insomnia which is helped with Ambien. Plan to get chest x-ray, no other symptoms on systems review HPI    Review of Systems see history of present illness     Objective:   Physical Exam number cooperative HEENT some nasal congestion boggy pale mucosa secondary to allergies Heart normal size rhythm no murmurs peripheral pulses are equal bilaterally lungs are clear palpation percussion and auscultation no rales no wheezing no dullness no tenderness no masses bowel sounds normal Extremities negative no edema          Assessment & Plan:  Fatigue to change propranolol to 20 mg twice a day to see if that affects fatigue also to get testosterone level Attention well controlled Shortness of breath to get chest x-ray Gout control with indomethacin to get uric acid level  future

## 2011-03-21 ENCOUNTER — Telehealth: Payer: Self-pay | Admitting: Family Medicine

## 2011-03-21 NOTE — Telephone Encounter (Signed)
Pt requesting x-rays to take to his orthopedic appt with Dr. Shelle Iron. Please contact.

## 2011-03-27 ENCOUNTER — Ambulatory Visit (INDEPENDENT_AMBULATORY_CARE_PROVIDER_SITE_OTHER): Payer: BC Managed Care – PPO | Admitting: Family Medicine

## 2011-03-27 DIAGNOSIS — E538 Deficiency of other specified B group vitamins: Secondary | ICD-10-CM

## 2011-03-27 MED ORDER — CYANOCOBALAMIN 1000 MCG/ML IJ SOLN: 1000.0000 ug | Freq: Once | INTRAMUSCULAR | Status: DC

## 2011-03-28 ENCOUNTER — Telehealth: Payer: Self-pay

## 2011-03-28 DIAGNOSIS — G4730 Sleep apnea, unspecified: Secondary | ICD-10-CM

## 2011-03-28 NOTE — Telephone Encounter (Signed)
Pt called and states he thinks he may have sleep apnea.  Pt states he has been taking ambien 1/2 tablet and he is still not sleeping; pt states this issue is getting worse.  Pt states he is still tired and would like to have a sleep study done.  Pls advise.

## 2011-03-29 NOTE — Telephone Encounter (Signed)
2 schedule sleep study

## 2011-04-02 ENCOUNTER — Other Ambulatory Visit: Payer: Self-pay | Admitting: Family Medicine

## 2011-04-02 DIAGNOSIS — G4730 Sleep apnea, unspecified: Secondary | ICD-10-CM

## 2011-04-02 NOTE — Telephone Encounter (Signed)
Ok per Dr. Scotty Court to send referral for sleep study.

## 2011-04-29 ENCOUNTER — Other Ambulatory Visit: Payer: Self-pay | Admitting: Family Medicine

## 2011-05-10 ENCOUNTER — Ambulatory Visit (HOSPITAL_BASED_OUTPATIENT_CLINIC_OR_DEPARTMENT_OTHER): Payer: BC Managed Care – PPO | Attending: Family Medicine

## 2011-05-10 DIAGNOSIS — G4730 Sleep apnea, unspecified: Secondary | ICD-10-CM

## 2011-05-10 DIAGNOSIS — G4710 Hypersomnia, unspecified: Secondary | ICD-10-CM | POA: Insufficient documentation

## 2011-05-17 DIAGNOSIS — G4710 Hypersomnia, unspecified: Secondary | ICD-10-CM

## 2011-05-17 DIAGNOSIS — G4730 Sleep apnea, unspecified: Secondary | ICD-10-CM

## 2011-05-17 NOTE — Procedures (Signed)
NAMEELLIJAH, LEFFEL              ACCOUNT NO.:  [id number]  MEDICAL RECORD NO.:  [id number]          PATIENT TYPE:  OUT  LOCATION:  SLEEP CENTER                 FACILITY:  [facility]  PHYSICIAN:  Barbaraann Share, MD,FCCPDATE OF BIRTH:  08/07/[birth year]  DATE OF STUDY:  05/10/2011                           NOCTURNAL POLYSOMNOGRAM  REFERRING PHYSICIAN:  Ellin Saba., MD  INDICATION FOR STUDY:  Hypersomnia with sleep apnea.  EPWORTH SLEEPINESS SCORE:  10.  MEDICATIONS:  SLEEP ARCHITECTURE:  The patient had a total sleep time of 276 minutes with no slow-wave sleep and decreased quantity of REM.  Sleep onset latency was slightly above normal at 34 minutes, and REM onset was at the upper limits of normal at 125 minutes.  Sleep efficiency was moderately reduced at 77%.  RESPIRATORY DATA:  The patient was found to have 1 central apnea and only 9 obstructive hypopneas, giving him an apnea-hypopnea index of 2 events per hour.  The events occurred in all body positions, but were more prominent during REM.  There was moderate snoring noted throughout.  OXYGEN DATA:  There was O2 desaturation as low as 90% with the patient's obstructive events.  CARDIAC DATA:  Rare PVC noted, but no clinically significant arrhythmias were seen.  MOVEMENT-PARASOMNIA:  The patient was found to have 285 periodic leg movements, but only 1 per hour that resulted in arousal or awakening. There were no abnormal behavior seen.  IMPRESSIONS-RECOMMENDATIONS: 1. Small numbers of obstructive events, which do not meet the     apnea/hypopnea index criteria for the obstructive sleep apnea     syndrome. 2. Rare premature ventricular contraction noted, but no clinically     significant arrhythmias were seen. 3. Very large numbers of leg jerks, but only minimal sleep disruption     noted.  However, given the patient's significant sleep difficulties     by history, clinical correlation is recommended.  The patient  may     have a primary movement disorder of sleep such as the restless legs     syndrome, chronic pain, or possibly a neurologic reason     for the above leg movements.  If his history is consistent with the     restless legs syndrome, perhaps a trial of a dopamine agonist would     be in order.     Barbaraann Share, MD,FCCP Diplomate, American Board of Sleep Medicine Electronically Signed    [facility]  D:  05/17/2011 09:45:01  T:  05/17/2011 14:55:59  Job:  [id number]

## 2011-09-16 ENCOUNTER — Encounter: Payer: Self-pay | Admitting: Family Medicine

## 2011-09-16 ENCOUNTER — Ambulatory Visit (INDEPENDENT_AMBULATORY_CARE_PROVIDER_SITE_OTHER): Payer: BC Managed Care – PPO | Admitting: Family Medicine

## 2011-09-16 DIAGNOSIS — E785 Hyperlipidemia, unspecified: Secondary | ICD-10-CM | POA: Insufficient documentation

## 2011-09-16 DIAGNOSIS — R739 Hyperglycemia, unspecified: Secondary | ICD-10-CM

## 2011-09-16 DIAGNOSIS — T7840XA Allergy, unspecified, initial encounter: Secondary | ICD-10-CM | POA: Insufficient documentation

## 2011-09-16 DIAGNOSIS — M109 Gout, unspecified: Secondary | ICD-10-CM

## 2011-09-16 DIAGNOSIS — K219 Gastro-esophageal reflux disease without esophagitis: Secondary | ICD-10-CM | POA: Insufficient documentation

## 2011-09-16 DIAGNOSIS — E291 Testicular hypofunction: Secondary | ICD-10-CM

## 2011-09-16 DIAGNOSIS — E349 Endocrine disorder, unspecified: Secondary | ICD-10-CM | POA: Insufficient documentation

## 2011-09-16 DIAGNOSIS — I10 Essential (primary) hypertension: Secondary | ICD-10-CM | POA: Insufficient documentation

## 2011-09-16 DIAGNOSIS — R52 Pain, unspecified: Secondary | ICD-10-CM

## 2011-09-16 DIAGNOSIS — G4700 Insomnia, unspecified: Secondary | ICD-10-CM

## 2011-09-16 DIAGNOSIS — R7309 Other abnormal glucose: Secondary | ICD-10-CM

## 2011-09-16 DIAGNOSIS — G43909 Migraine, unspecified, not intractable, without status migrainosus: Secondary | ICD-10-CM

## 2011-09-16 DIAGNOSIS — R5383 Other fatigue: Secondary | ICD-10-CM

## 2011-09-16 DIAGNOSIS — G2581 Restless legs syndrome: Secondary | ICD-10-CM

## 2011-09-16 MED ORDER — HYDROCODONE-ACETAMINOPHEN 10-650 MG PO TABS: 1.0000 | ORAL_TABLET | ORAL | Status: DC | PRN

## 2011-09-16 MED ORDER — ZOLPIDEM TARTRATE 10 MG PO TABS: 10.0000 mg | ORAL_TABLET | Freq: Every evening | ORAL | Status: DC | PRN

## 2011-09-16 MED ORDER — HYDROCODONE-HOMATROPINE 5-1.5 MG/5ML PO SYRP: 5.0000 mL | ORAL_SOLUTION | ORAL | Status: DC | PRN

## 2011-09-16 NOTE — Assessment & Plan Note (Signed)
Maintain adequate hydration and check a uric acid level

## 2011-09-16 NOTE — Assessment & Plan Note (Signed)
Recommend otc antihistamines and nasal saline

## 2011-09-16 NOTE — Assessment & Plan Note (Signed)
Multifactorial will check on sugar, tsh and testosterone

## 2011-09-16 NOTE — Assessment & Plan Note (Signed)
Infrequent but does respond to Hydrocodone prn. Encouraged adequate sleep, exercise, hydration and po intake

## 2011-09-16 NOTE — Assessment & Plan Note (Signed)
Well controlled today no change in medications. 

## 2011-09-16 NOTE — Assessment & Plan Note (Signed)
Sleep study confirmed significant RLS movements, will continue Ambien prn and patient may consider switch to RLS meds after school ends

## 2011-09-16 NOTE — Assessment & Plan Note (Signed)
Check a hgba1c with next blood draw, minimize simple carbs.

## 2011-09-16 NOTE — Assessment & Plan Note (Signed)
Stopped his testosterone treatment for the month of January but has restated it, he notes his energy level is better when he is on it. Will recheck levels with next blood draw

## 2011-09-16 NOTE — Progress Notes (Signed)
Patient ID: Taylor Mcdonald, male   DOB: [birth year], 56 y.o.   MRN: [id number] Taylor Mcdonald [id number] [birth year] 09/16/2011      Progress Note New Patient  Subjective  Chief Complaint  Chief Complaint  Patient presents with  . Establish Care    HPI  Patient is a 56 year old Caucasian male in today for new patient appointment. He is in with migraines and manages somewhat worse it does not need them frequently. He's had no recent illness, fevers, chills, chest pain, palpitations, shortness of breath, GI or GU c/o. He follows with Dr Mayford Knife of dermatology. He does struggle with insomnia at times. No dietary restrictions  Past Medical History  Diagnosis Date  . Gout   . Hypertension   . Low testosterone   . Migraine   . Allergy   . RLS (restless legs syndrome)   . Insomnia   . Hyperlipidemia   . GERD (gastroesophageal reflux disease)   . Hyperglycemia   . Fatigue     Past Surgical History  Procedure Date  . Tonsillectomy   . Panendoscopy     Family History  Problem Relation Age of Onset  . Obesity Mother   . Hypertension Son   . Hypertension Maternal Grandmother   . Obesity Maternal Grandmother   . Cancer Maternal Grandmother 50    intestinal  . Dementia Maternal Grandfather     History   Social History  . Marital Status: Married    Spouse Name: N/A    Number of Children: N/A  . Years of Education: N/A   Occupational History  . Not on file.   Social History Main Topics  . Smoking status: Never Smoker   . Smokeless tobacco: Never Used  . Alcohol Use: No  . Drug Use: Yes    Special: Hydrocodone  . Sexually Active: Yes   Other Topics Concern  . Not on file   Social History Narrative  . No narrative on file    Current Outpatient Prescriptions on File Prior to Visit  Medication Sig Dispense Refill  . aspirin 81 MG tablet Take 81 mg by mouth daily.        . fexofenadine-pseudoephedrine (ALLEGRA-D) 60-120 MG per tablet Take 1 tablet by mouth  every 12 (twelve) hours as needed.        . fluticasone (FLONASE) 50 MCG/ACT nasal spray 2 sprays by Nasal route daily.  16 g  11  . indomethacin (INDOCIN) 50 MG capsule Take 1 tablet three times a day after meals until symptoms have subsided then take 1 tablet everyday for 1 week  90 capsule  5  . lisinopril (PRINIVIL,ZESTRIL) 20 MG tablet Take 1 tablet (20 mg total) by mouth daily.  30 tablet  11  . Omeprazole (KLS OMPERAZOLE) 20 MG TBEC Take 1 tablet (20 mg total) by mouth daily.  30 each  11  . propranolol (INDERAL) 40 MG tablet Take 1 tablet (40 mg total) by mouth daily.  30 tablet  11  . Testosterone 20.25 MG/ACT (1.62%) GEL Place 2 Act onto the skin daily.  75 g  5   Current Facility-Administered Medications on File Prior to Visit  Medication Dose Route Frequency Provider Last Rate Last Dose  . cyanocobalamin ((VITAMIN B-12)) injection 1,000 mcg  1,000 mcg Intramuscular Once Lorretta Harp, MD        No Known Allergies  Review of Systems  Review of Systems  Constitutional: Positive for malaise/fatigue. Negative for fever and chills.  HENT:  Negative for hearing loss, nosebleeds and congestion.   Eyes: Negative for discharge.  Respiratory: Negative for cough, sputum production, shortness of breath and wheezing.   Cardiovascular: Negative for chest pain, palpitations and leg swelling.  Gastrointestinal: Negative for heartburn, nausea, vomiting, abdominal pain, diarrhea, constipation and blood in stool.  Genitourinary: Negative for dysuria, urgency, frequency and hematuria.  Musculoskeletal: Negative for myalgias, back pain and falls.  Skin: Negative for rash.  Neurological: Positive for headaches. Negative for dizziness, tremors, sensory change, focal weakness, loss of consciousness and weakness.  Endo/Heme/Allergies: Negative for polydipsia. Does not bruise/bleed easily.  Psychiatric/Behavioral: Negative for depression and suicidal ideas. The patient has insomnia. The patient  is not nervous/anxious.     Objective  BP 127/78  Pulse 67  Temp(Src) 98.5 F (36.9 C) (Temporal)  Ht 6' 1.5" (1.867 m)  Wt 202 lb (91.627 kg)  BMI 26.29 kg/m2  SpO2 98%  Physical Exam  Physical Exam  Constitutional: He is oriented to person, place, and time and well-developed, well-nourished, and in no distress. No distress.  HENT:  Head: Normocephalic and atraumatic.  Eyes: Conjunctivae are normal.  Neck: Neck supple. No thyromegaly present.  Cardiovascular: Normal rate, regular rhythm and normal heart sounds.   No murmur heard. Pulmonary/Chest: Effort normal and breath sounds normal. No respiratory distress.  Abdominal: He exhibits no distension and no mass. There is no tenderness.  Musculoskeletal: He exhibits no edema.  Neurological: He is alert and oriented to person, place, and time.  Skin: Skin is warm.  Psychiatric: Memory, affect and judgment normal.       Assessment & Plan  Hypertension Well controlled today no change in medications  Gout Maintain adequate hydration and check a uric acid level  Low testosterone Stopped his testosterone treatment for the month of January but has restated it, he notes his energy level is better when he is on it. Will recheck levels with next blood draw  Migraine Infrequent but does respond to Hydrocodone prn. Encouraged adequate sleep, exercise, hydration and po intake  Hyperglycemia Check a hgba1c with next blood draw, minimize simple carbs.  GERD (gastroesophageal reflux disease) Avoid offending foods, may continue to use Omeprazole prn  Fatigue Multifactorial will check on sugar, tsh and testosterone  RLS (restless legs syndrome) Sleep study confirmed significant RLS movements, will continue Ambien prn and patient may consider switch to RLS meds after school ends  Allergic state Recommend otc antihistamines and nasal saline

## 2011-09-16 NOTE — Patient Instructions (Signed)
Preventative Care for Adults, Male A healthy lifestyle and preventative care can promote health and wellness. Preventative health guidelines for men include the following key practices:  A routine yearly physical is a good way to check with your caregiver about your health and preventative screening. It is a chance to share any concerns and updates on your health, and to receive a thorough exam.   Visit your dentist for a routine exam and preventative care every 6 months. Brush your teeth twice a day and floss once a day. Good oral hygiene prevents tooth decay and gum disease.   The frequency of eye exams is based on your age, health, family medical history, use of contact lenses, and other factors. Follow your caregiver's recommendations for frequency of eye exams.   Eat a healthy diet. Foods like vegetables, fruits, whole grains, low-fat dairy products, and lean protein foods contain the nutrients you need without too many calories. Decrease your intake of foods high in solid fats, added sugars, and salt. Eat the right amount of calories for you.Get information about a proper diet from your caregiver, if necessary.   Regular physical exercise is one of the most important things you can do for your health. Most adults should get at least 150 minutes of moderate-intensity exercise (any activity that increases your heart rate and causes you to sweat) each week. In addition, most adults need muscle-strengthening exercises on 2 or more days a week.   Maintain a healthy weight. The body mass index (BMI) is a screening tool to identify possible weight problems. It provides an estimate of body fat based on height and weight. Your caregiver can help determine your BMI, and can help you achieve or maintain a healthy weight.For adults 20 years and older:   A BMI below 18.5 is considered underweight.   A BMI of 18.5 to 24.9 is normal.   A BMI of 25 to 29.9 is considered overweight.   A BMI of 30 and  above is considered obese.   Maintain normal blood lipids and cholesterol levels by exercising and minimizing your intake of saturated fat. Eat a balanced diet with plenty of fruit and vegetables. Blood tests for lipids and cholesterol should begin at age 20 and be repeated every 5 years. If your lipid or cholesterol levels are high, you are over 50, or you are a high risk for heart disease, you may need your cholesterol levels checked more frequently.Ongoing high lipid and cholesterol levels should be treated with medicines if diet and exercise are not effective.   If you smoke, find out from your caregiver how to quit. If you do not use tobacco, do not start.   If you choose to drink alcohol, do not exceed 2 drinks per day. One drink is considered to be 12 ounces (355 mL) of beer, 5 ounces (148 mL) of wine, or 1.5 ounces (44 mL) of liquor.   Avoid use of street drugs. Do not share needles with anyone. Ask for help if you need support or instructions about stopping the use of drugs.   High blood pressure causes heart disease and increases the risk of stroke. Your blood pressure should be checked at least every 1 to 2 years. Ongoing high blood pressure should be treated with medicines, if weight loss and exercise are not effective.   If you are 45 to 57 years old, ask your caregiver if you should take aspirin to prevent heart disease.   Diabetes screening involves taking a blood   sample to check your fasting blood sugar level. This should be done once every 3 years, after age 45, if you are within normal weight and without risk factors for diabetes. Testing should be considered at a younger age or be carried out more frequently if you are overweight and have at least 1 risk factor for diabetes.   Colorectal cancer can be detected and often prevented. Most routine colorectal cancer screening begins at the age of 50 and continues through age 75. However, your caregiver may recommend screening at an  earlier age if you have risk factors for colon cancer. On a yearly basis, your caregiver may provide home test kits to check for hidden blood in the stool. Use of a small camera at the end of a tube, to directly examine the colon (sigmoidoscopy or colonoscopy), can detect the earliest forms of colorectal cancer. Talk to your caregiver about this at age 50, when routine screening begins. Direct examination of the colon should be repeated every 5 to 10 years through age 75, unless early forms of pre-cancerous polyps or small growths are found.   Practice safe sex. Use condoms and avoid high-risk sexual practices to reduce the spread of sexually transmitted infections (STIs). STIs include gonorrhea, chlamydia, syphilis, trichomonas, herpes, HPV, and human immunodeficiency virus (HIV). Herpes, HIV, and HPV are viral illnesses that have no cure. They can result in disability, cancer, and death.   A one-time screening for abdominal aortic aneurysm (AAA) and surgical repair of large AAAs by sound wave imaging (ultrasonography) is recommended for ages 65 to 75 years who are current or former smokers.   Healthy men should no longer receive prostate-specific antigen (PSA) blood tests as part of routine cancer screening. Consult with your caregiver about prostate cancer screening.   Use sunscreen with skin protection factor (SPF) of 30 or more. Apply sunscreen liberally and repeatedly throughout the day. You should seek shade when your shadow is shorter than you. Protect yourself by wearing long sleeves, pants, a wide-brimmed hat, and sunglasses year round, whenever you are outdoors.   Once a month, do a whole body skin exam, using a mirror to look at the skin on your back. Notify your caregiver of new moles, moles that have irregular borders, moles that are larger than a pencil eraser, or moles that have changed in shape or color.   Stay current with required immunizations.   Influenza. You need a dose every  fall (or winter). The composition of the flu vaccine changes each year, so being vaccinated once is not enough.   Pneumococcal polysaccharide. You need 1 to 2 doses if you smoke cigarettes or if you have certain chronic medical conditions. You need 1 dose at age 65 (or older) if you have never been vaccinated.   Tetanus, diphtheria, pertussis (Tdap, Td). Get 1 dose of Tdap vaccine if you are younger than age 65 years, are over 65 and have contact with an infant, are a healthcare worker, or simply want to be protected from whooping cough. After that, you need a Td booster dose every 10 years. Consult your caregiver if you have not had at least 3 tetanus and diphtheria-containing shots sometime in your life or have a deep or dirty wound.   HPV. This vaccine is recommended for males 13 through 57 years of age. This vaccine may be given to men 22 through 57 years of age who have not completed the 3 dose series. It is recommended for men through age 26   who have sex with men or whose immune system is weakened because of HIV infection, other illness, or medications. The vaccine is given in 3 doses over 6 months.   Measles, mumps, rubella (MMR). You need at least 1 dose of MMR if you were born in 1957 or later. You may also need a 2nd dose.   Meningococcal. If you are age 19 to 21 years and a first-year college student living in a residence hall, or have one of several medical conditions, you need to get vaccinated against meningococcal disease. You may also need additional booster doses.   Zoster (shingles). If you are age 60 years or older, you should get this vaccine.   Varicella (chickenpox). If you have never had chickenpox or you were vaccinated but received only 1 dose, talk to your caregiver to find out if you need this vaccine.   Hepatitis A. You need this vaccine if you have a specific risk factor for hepatitis A virus infection, or you simply wish to be protected from this disease. The vaccine is  usually given as 2 doses, 6 to 18 months apart.   Hepatitis B. You need this vaccine if you have a specific risk factor for hepatitis B virus infection or you simply wish to be protected from this disease. The vaccine is given in 3 doses, usually over 6 months.  Preventative Service / Frequency Ages 19 to 39  Blood pressure check.** / Every 1 to 2 years.   Lipid and cholesterol check.**/ Every 5 years beginning at age 20.   Skin self-exam. / Monthly.   Influenza immunization.** / Every year.   Pneumococcal polysaccharide immunization.** / 1 to 2 doses if you smoke cigarettes or if you have certain chronic medical conditions.   Tetanus, diphtheria, pertussis (Tdap,Td) immunization. / A one-time dose of Tdap vaccine. After that, you need a Td booster dose every 10 years.   HPV immunization. / 3 doses over 6 months, if 26 and younger.   Measles, mumps, rubella (MMR) immunization. / You need at least 1 dose of MMR if you were born in 1957 or later. You may also need a 2nd dose.   Meningococcal immunization. / 1 dose if you are age 19 to 21 years and a first-year college student living in a residence hall, or have one of several medical conditions, you need to get vaccinated against meningococcal disease. You may also need additional booster doses.   Varicella immunization. **/ Consult your caregiver.   Hepatitis A immunization. ** / Consult your caregiver. 2 doses, 6 to 18 months apart.   Hepatitis B immunization.** / Consult your caregiver. 3 doses usually over 6 months.  Ages 40 to 64  Blood pressure check.** / Every 1 to 2 years.   Lipid and cholesterol check.**/ Every 5 years beginning at age 20.   Fecal occult blood test (FOBT) of stool. / Every year beginning at age 50 and continuing until age 75. You may not have to do this test if you get colonoscopy every 10 years.   Flexible sigmoidoscopy** or colonoscopy.** / Every 5 years for a flexible sigmoidoscopy or every 10 years for  a colonoscopy beginning at age 50 and continuing until age 75.   Skin self-exam. / Monthly.   Influenza immunization.** / Every year.   Pneumococcal polysaccharide immunization.** / 1 to 2 doses if you smoke cigarettes or if you have certain chronic medical conditions.   Tetanus, diphtheria, pertussis (Tdap/Td) immunization.** / A one-time dose of   Tdap vaccine. After that, you need a Td booster dose every 10 years.   Measles, mumps, rubella (MMR) immunization. / You need at least 1 dose of MMR if you were born in 1957 or later. You may also need a 2nd dose.   Varicella immunization. **/ Consult your caregiver.   Meningococcal immunization.** / Consult your caregiver.   Hepatitis A immunization. ** / Consult your caregiver. 2 doses, 6 to 18 months apart.   Hepatitis B immunization.** / Consult your caregiver. 3 doses, usually over 6 months.  Ages 65 and over  Blood pressure check.** / Every 1 to 2 years.   Lipid and cholesterol check.**/ Every 5 years beginning at age 20.   Fecal occult blood test (FOBT) of stool. / Every year beginning at age 50 and continuing until age 75. You may not have to do this test if you get colonoscopy every 10 years.   Flexible sigmoidoscopy** or colonoscopy.** / Every 5 years for a flexible sigmoidoscopy or every 10 years for a colonoscopy beginning at age 50 and continuing until age 75.   Abdominal aortic aneurysm (AAA) screening.** / A one-time screening for ages 65 to 75 years who are current or former smokers.   Skin self-exam. / Monthly.   Influenza immunization.** / Every year.   Pneumococcal polysaccharide immunization.** / 1 dose at age 65 (or older) if you have never been vaccinated.   Tetanus, diphtheria, pertussis (Tdap, Td) immunization. / A one-time dose of Tdap vaccine if you are over 65 and have contact with an infant, are a healthcare worker, or simply want to be protected from whooping cough. After that, you need a Td booster dose  every 10 years.   Varicella immunization. **/ Consult your caregiver.   Meningococcal immunization.** / Consult your caregiver.   Hepatitis A immunization. ** / Consult your caregiver. 2 doses, 6 to 18 months apart.   Hepatitis B immunization.** / Check with your caregiver. 3 doses, usually over 6 months.  **Family history and personal history of risk and conditions may change your caregiver's recommendations. Document Released: 09/10/2001 Document Revised: 03/27/2011 Document Reviewed: 12/10/2010 [employer] Patient Information 2012 [employer]. 

## 2011-09-16 NOTE — Assessment & Plan Note (Signed)
Avoid offending foods, may continue to use Omeprazole prn

## 2011-10-01 ENCOUNTER — Encounter: Payer: Self-pay | Admitting: Family Medicine

## 2011-10-01 ENCOUNTER — Ambulatory Visit (INDEPENDENT_AMBULATORY_CARE_PROVIDER_SITE_OTHER): Payer: BC Managed Care – PPO | Admitting: Family Medicine

## 2011-10-01 DIAGNOSIS — I10 Essential (primary) hypertension: Secondary | ICD-10-CM

## 2011-10-01 DIAGNOSIS — M109 Gout, unspecified: Secondary | ICD-10-CM

## 2011-10-01 DIAGNOSIS — M79609 Pain in unspecified limb: Secondary | ICD-10-CM

## 2011-10-01 DIAGNOSIS — M79672 Pain in left foot: Secondary | ICD-10-CM

## 2011-10-01 HISTORY — DX: Pain in left foot: M79.672

## 2011-10-01 MED ORDER — COLCHICINE 0.6 MG PO TABS: ORAL_TABLET | ORAL | Status: DC

## 2011-10-01 MED ORDER — NAPROXEN SODIUM 220 MG PO TABS: 220.0000 mg | ORAL_TABLET | Freq: Two times a day (BID) | ORAL | Status: DC

## 2011-10-01 MED ORDER — METHYLPREDNISOLONE 4 MG PO KIT
PACK | ORAL | Status: AC
Start: 2011-10-01 — End: 2011-10-08

## 2011-10-01 NOTE — Patient Instructions (Signed)
Gout Gout is an inflammatory condition (arthritis) caused by a buildup of uric acid crystals in the joints. Uric acid is a chemical that is normally present in the blood. Under some circumstances, uric acid can form into crystals in your joints. This causes joint redness, soreness, and swelling (inflammation). Repeat attacks are common. Over time, uric acid crystals can form into masses (tophi) near a joint, causing disfigurement. Gout is treatable and often preventable. CAUSES  The disease begins with elevated levels of uric acid in the blood. Uric acid is produced by your body when it breaks down a naturally found substance called purines. This also happens when you eat certain foods such as meats and fish. Causes of an elevated uric acid level include:  Being passed down from parent to child (heredity).   Diseases that cause increased uric acid production (obesity, psoriasis, some cancers).   Excessive alcohol use.   Diet, especially diets rich in meat and seafood.   Medicines, including certain cancer-fighting drugs (chemotherapy), diuretics, and aspirin.   Chronic kidney disease. The kidneys are no longer able to remove uric acid well.   Problems with metabolism.  Conditions strongly associated with gout include:  Obesity.   High blood pressure.   High cholesterol.   Diabetes.  Not everyone with elevated uric acid levels gets gout. It is not understood why some people get gout and others do not. Surgery, joint injury, and eating too much of certain foods are some of the factors that can lead to gout. SYMPTOMS   An attack of gout comes on quickly. It causes intense pain with redness, swelling, and warmth in a joint.   Fever can occur.   Often, only one joint is involved. Certain joints are more commonly involved:   Base of the big toe.   Knee.   Ankle.   Wrist.   Finger.  Without treatment, an attack usually goes away in a few days to weeks. Between attacks, you  usually will not have symptoms, which is different from many other forms of arthritis. DIAGNOSIS  Your caregiver will suspect gout based on your symptoms and exam. Removal of fluid from the joint (arthrocentesis) is done to check for uric acid crystals. Your caregiver will give you a medicine that numbs the area (local anesthetic) and use a needle to remove joint fluid for exam. Gout is confirmed when uric acid crystals are seen in joint fluid, using a special microscope. Sometimes, blood, urine, and X-ray tests are also used. TREATMENT  There are 2 phases to gout treatment: treating the sudden onset (acute) attack and preventing attacks (prophylaxis). Treatment of an Acute Attack  Medicines are used. These include anti-inflammatory medicines or steroid medicines.   An injection of steroid medicine into the affected joint is sometimes necessary.   The painful joint is rested. Movement can worsen the arthritis.   You may use warm or cold treatments on painful joints, depending which works best for you.   Discuss the use of coffee, vitamin C, or cherries with your caregiver. These may be helpful treatment options.  Treatment to Prevent Attacks After the acute attack subsides, your caregiver may advise prophylactic medicine. These medicines either help your kidneys eliminate uric acid from your body or decrease your uric acid production. You may need to stay on these medicines for a very long time. The early phase of treatment with prophylactic medicine can be associated with an increase in acute gout attacks. For this reason, during the first few months   of treatment, your caregiver may also advise you to take medicines usually used for acute gout treatment. Be sure you understand your caregiver's directions. You should also discuss dietary treatment with your caregiver. Certain foods such as meats and fish can increase uric acid levels. Other foods such as dairy can decrease levels. Your caregiver  can give you a list of foods to avoid. HOME CARE INSTRUCTIONS   Do not take aspirin to relieve pain. This raises uric acid levels.   Only take over-the-counter or prescription medicines for pain, discomfort, or fever as directed by your caregiver.   Rest the joint as much as possible. When in bed, keep sheets and blankets off painful areas.   Keep the affected joint raised (elevated).   Use crutches if the painful joint is in your leg.   Drink enough water and fluids to keep your urine clear or pale yellow. This helps your body get rid of uric acid. Do not drink alcoholic beverages. They slow the passage of uric acid.   Follow your caregiver's dietary instructions. Pay careful attention to the amount of protein you eat. Your daily diet should emphasize fruits, vegetables, whole grains, and fat-free or low-fat milk products.   Maintain a healthy body weight.  SEEK MEDICAL CARE IF:   You have an oral temperature above 102 F (38.9 C).   You develop diarrhea, vomiting, or any side effects from medicines.   You do not feel better in 24 hours, or you are getting worse.  SEEK IMMEDIATE MEDICAL CARE IF:   Your joint becomes suddenly more tender and you have:   Chills.   An oral temperature above 102 F (38.9 C), not controlled by medicine.  MAKE SURE YOU:   Understand these instructions.   Will watch your condition.   Will get help right away if you are not doing well or get worse.  Document Released: 07/12/2000 Document Revised: 07/04/2011 Document Reviewed: 10/23/2009 [employer] Patient Information 2012 [employer]. 

## 2011-10-01 NOTE — Assessment & Plan Note (Signed)
Heel red, warm and slightly swollen. Looks like gout but did not respond to Indomethacin this time. Will try a once day course of Colchicine as directed and if no or partial response will try a Medrol dose pack. If no response will need referral to ortho. Would like to see Dr Thomasena Edis who has taken care of his wife in past

## 2011-10-01 NOTE — Assessment & Plan Note (Signed)
Well controlled at today's visit despite pain, no changes

## 2011-10-01 NOTE — Progress Notes (Signed)
Patient ID: BRIT WERNETTE, male   DOB: July 29, 1956, 56 y.o.   MRN: [id number] KYE HEDDEN [id number] 01-09-1956 10/01/2011      Progress Note-Follow Up  Subjective  Chief Complaint  Chief Complaint  Patient presents with  . pain in achilles tendon    X 2 days-left foot- gout medication not helping    HPI  Patient is a 56 year old Caucasian male who is in today with a several day history of left heel pain the pain began on Sunday. The day before he had been working in the aren't picking up limbs but he denies any trauma twist or falls. On Sunday 3 days ago he developed pain in his left posterior heel which has not resolved. It felt gouty in nature as we couldn't indomethacin but the this did not help the pain. He's had no other recent illness, fevers, chills, URI symptoms or other complaints. He has no numbness tingling or weakness anywhere else in his foot appeared  Past Medical History  Diagnosis Date  . Gout   . Hypertension   . Low testosterone   . Migraine   . Allergy   . RLS (restless legs syndrome)   . Insomnia   . Hyperlipidemia   . GERD (gastroesophageal reflux disease)   . Hyperglycemia   . Fatigue   . Pain of left heel 10/01/2011    Past Surgical History  Procedure Date  . Tonsillectomy   . Panendoscopy     Family History  Problem Relation Age of Onset  . Obesity Mother   . Hypertension Son   . Hypertension Maternal Grandmother   . Obesity Maternal Grandmother   . Cancer Maternal Grandmother 50    intestinal  . Dementia Maternal Grandfather     History   Social History  . Marital Status: Married    Spouse Name: N/A    Number of Children: N/A  . Years of Education: N/A   Occupational History  . Not on file.   Social History Main Topics  . Smoking status: Never Smoker   . Smokeless tobacco: Never Used  . Alcohol Use: No  . Drug Use: Yes    Special: Hydrocodone  . Sexually Active: Yes   Other Topics Concern  . Not on file   Social  History Narrative  . No narrative on file    Current Outpatient Prescriptions on File Prior to Visit  Medication Sig Dispense Refill  . aspirin 81 MG tablet Take 81 mg by mouth daily.        . fexofenadine-pseudoephedrine (ALLEGRA-D) 60-120 MG per tablet Take 1 tablet by mouth every 12 (twelve) hours as needed.        . fluticasone (FLONASE) 50 MCG/ACT nasal spray 2 sprays by Nasal route daily.  16 g  11  . indomethacin (INDOCIN) 50 MG capsule Take 1 tablet three times a day after meals until symptoms have subsided then take 1 tablet everyday for 1 week  90 capsule  5  . lisinopril (PRINIVIL,ZESTRIL) 20 MG tablet Take 1 tablet (20 mg total) by mouth daily.  30 tablet  11  . Omeprazole (KLS OMPERAZOLE) 20 MG TBEC Take 1 tablet (20 mg total) by mouth daily.  30 each  11  . propranolol (INDERAL) 40 MG tablet Take 1 tablet (40 mg total) by mouth daily.  30 tablet  11  . zolpidem (AMBIEN) 10 MG tablet Take 1 tablet (10 mg total) by mouth at bedtime as needed for sleep.  30  tablet  5  . HYDROcodone-acetaminophen (LORCET) 10-650 MG per tablet Take 1 tablet by mouth every 4 (four) hours as needed. For pain  100 tablet  5  . HYDROcodone-homatropine (HYCODAN) 5-1.5 MG/5ML syrup Take 5 mLs by mouth every 4 (four) hours as needed.  120 mL  1  . Testosterone 20.25 MG/ACT (1.62%) GEL Place 2 Act onto the skin daily.  75 g  5   Current Facility-Administered Medications on File Prior to Visit  Medication Dose Route Frequency Provider Last Rate Last Dose  . cyanocobalamin ((VITAMIN B-12)) injection 1,000 mcg  1,000 mcg Intramuscular Once Lorretta Harp, MD        No Known Allergies  Review of Systems  Review of Systems  Constitutional: Negative for fever and malaise/fatigue.  HENT: Negative for congestion.   Eyes: Negative for discharge.  Respiratory: Negative for shortness of breath.   Cardiovascular: Negative for chest pain, palpitations and leg swelling.  Gastrointestinal: Negative for  nausea, abdominal pain and diarrhea.  Genitourinary: Negative for dysuria.  Musculoskeletal: Positive for joint pain. Negative for falls.       Left heel pain  Skin: Negative for rash.  Neurological: Negative for loss of consciousness and headaches.  Endo/Heme/Allergies: Negative for polydipsia.  Psychiatric/Behavioral: Negative for depression and suicidal ideas. The patient is not nervous/anxious and does not have insomnia.     Objective  BP 113/75  Pulse 61  Temp(Src) 97.8 F (36.6 C) (Temporal)  Ht 6\' 5"  (1.956 m)  Wt 203 lb (92.08 kg)  BMI 24.07 kg/m2  SpO2 100%  Physical Exam  Physical Exam  Constitutional: He is oriented to person, place, and time and well-developed, well-nourished, and in no distress. No distress.  HENT:  Head: Normocephalic and atraumatic.  Eyes: Conjunctivae are normal.  Neck: Neck supple. No thyromegaly present.  Cardiovascular: Normal rate, regular rhythm and normal heart sounds.   No murmur heard. Pulmonary/Chest: Effort normal and breath sounds normal. No respiratory distress.  Abdominal: He exhibits no distension and no mass. There is no tenderness.  Musculoskeletal: He exhibits edema and tenderness.       Left heel erythematous, warm and swollen  Neurological: He is alert and oriented to person, place, and time.  Skin: Skin is warm.  Psychiatric: Memory, affect and judgment normal.    Lab Results  Component Value Date   TSH 0.51 10/18/2010   Lab Results  Component Value Date   WBC 8.9 02/26/2011   HGB 14.9 02/26/2011   HCT 44.6 02/26/2011   MCV 87.1 02/26/2011   PLT 264.0 02/26/2011   Lab Results  Component Value Date   CREATININE 1.1 02/26/2011   BUN 15 02/26/2011   NA 138 02/26/2011   K 4.3 02/26/2011   CL 102 02/26/2011   CO2 27 02/26/2011   Lab Results  Component Value Date   ALT 19 10/18/2010   AST 18 10/18/2010   ALKPHOS 39 10/18/2010   BILITOT 0.8 10/18/2010   Lab Results  Component Value Date   CHOL 209* 10/18/2010   Lab  Results  Component Value Date   HDL 47.90 10/18/2010   Lab Results  Component Value Date   LDLCALC 107* 07/20/2008   Lab Results  Component Value Date   TRIG 184.0* 10/18/2010   Lab Results  Component Value Date   CHOLHDL 4 10/18/2010     Assessment & Plan  Hypertension Well controlled at today's visit despite pain, no changes  Pain of left heel Heel red, warm and  slightly swollen. Looks like gout but did not respond to Indomethacin this time. Will try a once day course of Colchicine as directed and if no or partial response will try a Medrol dose pack. If no response will need referral to ortho. Would like to see Dr Thomasena Edis who has taken care of his wife in past

## 2011-11-12 ENCOUNTER — Other Ambulatory Visit: Payer: Self-pay | Admitting: Family Medicine

## 2011-12-13 ENCOUNTER — Other Ambulatory Visit (INDEPENDENT_AMBULATORY_CARE_PROVIDER_SITE_OTHER): Payer: BC Managed Care – PPO

## 2011-12-13 DIAGNOSIS — R739 Hyperglycemia, unspecified: Secondary | ICD-10-CM

## 2011-12-13 DIAGNOSIS — M109 Gout, unspecified: Secondary | ICD-10-CM

## 2011-12-13 DIAGNOSIS — I10 Essential (primary) hypertension: Secondary | ICD-10-CM

## 2011-12-13 DIAGNOSIS — R5381 Other malaise: Secondary | ICD-10-CM

## 2011-12-13 DIAGNOSIS — E291 Testicular hypofunction: Secondary | ICD-10-CM

## 2011-12-13 DIAGNOSIS — R5383 Other fatigue: Secondary | ICD-10-CM

## 2011-12-13 DIAGNOSIS — R7989 Other specified abnormal findings of blood chemistry: Secondary | ICD-10-CM

## 2011-12-13 DIAGNOSIS — E785 Hyperlipidemia, unspecified: Secondary | ICD-10-CM

## 2011-12-13 LAB — RENAL FUNCTION PANEL
Albumin: 4.1 g/dL (ref 3.5–5.2)
BUN: 18 mg/dL (ref 6–23)
Calcium: 9.2 mg/dL (ref 8.4–10.5)
Creatinine, Ser: 1.1 mg/dL (ref 0.4–1.5)
Glucose, Bld: 105 mg/dL — ABNORMAL HIGH (ref 70–99)
Potassium: 4.3 mEq/L (ref 3.5–5.1)

## 2011-12-13 LAB — CBC
HCT: 42.2 % (ref 39.0–52.0)
Hemoglobin: 14 g/dL (ref 13.0–17.0)
MCHC: 33.2 g/dL (ref 30.0–36.0)
Platelets: 236 10*3/uL (ref 150.0–400.0)

## 2011-12-13 LAB — HEPATIC FUNCTION PANEL
ALT: 18 U/L (ref 0–53)
Albumin: 4.1 g/dL (ref 3.5–5.2)
Bilirubin, Direct: 0 mg/dL (ref 0.0–0.3)
Total Protein: 6.6 g/dL (ref 6.0–8.3)

## 2011-12-13 LAB — LIPID PANEL
Total CHOL/HDL Ratio: 4
Triglycerides: 181 mg/dL — ABNORMAL HIGH (ref 0.0–149.0)

## 2011-12-13 LAB — TESTOSTERONE: Testosterone: 395.13 ng/dL (ref 350.00–890.00)

## 2011-12-13 LAB — TSH: TSH: 0.34 u[IU]/mL — ABNORMAL LOW (ref 0.35–5.50)

## 2011-12-20 ENCOUNTER — Encounter: Payer: Self-pay | Admitting: Family Medicine

## 2011-12-20 ENCOUNTER — Ambulatory Visit (INDEPENDENT_AMBULATORY_CARE_PROVIDER_SITE_OTHER): Payer: BC Managed Care – PPO | Admitting: Family Medicine

## 2011-12-20 VITALS — BP 123/82 | HR 78 | Temp 98.1°F | Ht 72.5 in | Wt 198.4 lb

## 2011-12-20 DIAGNOSIS — M109 Gout, unspecified: Secondary | ICD-10-CM

## 2011-12-20 DIAGNOSIS — E785 Hyperlipidemia, unspecified: Secondary | ICD-10-CM

## 2011-12-20 DIAGNOSIS — G4700 Insomnia, unspecified: Secondary | ICD-10-CM

## 2011-12-20 DIAGNOSIS — R7309 Other abnormal glucose: Secondary | ICD-10-CM

## 2011-12-20 DIAGNOSIS — R739 Hyperglycemia, unspecified: Secondary | ICD-10-CM

## 2011-12-20 DIAGNOSIS — K219 Gastro-esophageal reflux disease without esophagitis: Secondary | ICD-10-CM

## 2011-12-20 DIAGNOSIS — E559 Vitamin D deficiency, unspecified: Secondary | ICD-10-CM

## 2011-12-20 DIAGNOSIS — I10 Essential (primary) hypertension: Secondary | ICD-10-CM

## 2011-12-20 MED ORDER — LISINOPRIL 20 MG PO TABS: 20.0000 mg | ORAL_TABLET | Freq: Every day | ORAL | Status: DC

## 2011-12-20 MED ORDER — FLUTICASONE PROPIONATE 50 MCG/ACT NA SUSP: 2.0000 | Freq: Every day | NASAL | Status: DC

## 2011-12-20 MED ORDER — ZOLPIDEM TARTRATE 10 MG PO TABS: 10.0000 mg | ORAL_TABLET | Freq: Every evening | ORAL | Status: DC | PRN

## 2011-12-20 MED ORDER — OMEPRAZOLE 20 MG PO TBEC: 1.0000 | DELAYED_RELEASE_TABLET | Freq: Every day | ORAL | Status: DC

## 2011-12-20 MED ORDER — INDOMETHACIN 50 MG PO CAPS: ORAL_CAPSULE | ORAL | Status: DC

## 2011-12-20 NOTE — Assessment & Plan Note (Signed)
Using zolpidem roughly 1/2 tab most nights with good effect. Wants to try and use as little as possible. Can alternate with Diphenhydramine. Spoke of sleep hygiene

## 2011-12-20 NOTE — Assessment & Plan Note (Addendum)
Improved with topical treatment. Patient is interested in tryingto not use it anymore than he has to. We will recheck level with next

## 2011-12-20 NOTE — Assessment & Plan Note (Signed)
Adequately controlled, no changes 

## 2011-12-20 NOTE — Assessment & Plan Note (Signed)
Improved, avoid trans fats, increase exercise, start MegaRed caps

## 2011-12-20 NOTE — Assessment & Plan Note (Signed)
Manages with Omeprazole 20 mg daily. Avoid offending foods

## 2011-12-20 NOTE — Assessment & Plan Note (Signed)
No further flares. Encouraged to avoid dehydration and foods containing uric acid

## 2011-12-20 NOTE — Patient Instructions (Addendum)
Gout Gout is an inflammatory condition (arthritis) caused by a buildup of uric acid crystals in the joints. Uric acid is a chemical that is normally present in the blood. Under some circumstances, uric acid can form into crystals in your joints. This causes joint redness, soreness, and swelling (inflammation). Repeat attacks are common. Over time, uric acid crystals can form into masses (tophi) near a joint, causing disfigurement. Gout is treatable and often preventable. CAUSES  The disease begins with elevated levels of uric acid in the blood. Uric acid is produced by your body when it breaks down a naturally found substance called purines. This also happens when you eat certain foods such as meats and fish. Causes of an elevated uric acid level include:  Being passed down from parent to child (heredity).   Diseases that cause increased uric acid production (obesity, psoriasis, some cancers).   Excessive alcohol use.   Diet, especially diets rich in meat and seafood.   Medicines, including certain cancer-fighting drugs (chemotherapy), diuretics, and aspirin.   Chronic kidney disease. The kidneys are no longer able to remove uric acid well.   Problems with metabolism.  Conditions strongly associated with gout include:  Obesity.   High blood pressure.   High cholesterol.   Diabetes.  Not everyone with elevated uric acid levels gets gout. It is not understood why some people get gout and others do not. Surgery, joint injury, and eating too much of certain foods are some of the factors that can lead to gout. SYMPTOMS   An attack of gout comes on quickly. It causes intense pain with redness, swelling, and warmth in a joint.   Fever can occur.   Often, only one joint is involved. Certain joints are more commonly involved:   Base of the big toe.   Knee.   Ankle.   Wrist.   Finger.  Without treatment, an attack usually goes away in a few days to weeks. Between attacks, you  usually will not have symptoms, which is different from many other forms of arthritis. DIAGNOSIS  Your caregiver will suspect gout based on your symptoms and exam. Removal of fluid from the joint (arthrocentesis) is done to check for uric acid crystals. Your caregiver will give you a medicine that numbs the area (local anesthetic) and use a needle to remove joint fluid for exam. Gout is confirmed when uric acid crystals are seen in joint fluid, using a special microscope. Sometimes, blood, urine, and X-ray tests are also used. TREATMENT  There are 2 phases to gout treatment: treating the sudden onset (acute) attack and preventing attacks (prophylaxis). Treatment of an Acute Attack  Medicines are used. These include anti-inflammatory medicines or steroid medicines.   An injection of steroid medicine into the affected joint is sometimes necessary.   The painful joint is rested. Movement can worsen the arthritis.   You may use warm or cold treatments on painful joints, depending which works best for you.   Discuss the use of coffee, vitamin C, or cherries with your caregiver. These may be helpful treatment options.  Treatment to Prevent Attacks After the acute attack subsides, your caregiver may advise prophylactic medicine. These medicines either help your kidneys eliminate uric acid from your body or decrease your uric acid production. You may need to stay on these medicines for a very long time. The early phase of treatment with prophylactic medicine can be associated with an increase in acute gout attacks. For this reason, during the first few months   of treatment, your caregiver may also advise you to take medicines usually used for acute gout treatment. Be sure you understand your caregiver's directions. You should also discuss dietary treatment with your caregiver. Certain foods such as meats and fish can increase uric acid levels. Other foods such as dairy can decrease levels. Your caregiver  can give you a list of foods to avoid. HOME CARE INSTRUCTIONS   Do not take aspirin to relieve pain. This raises uric acid levels.   Only take over-the-counter or prescription medicines for pain, discomfort, or fever as directed by your caregiver.   Rest the joint as much as possible. When in bed, keep sheets and blankets off painful areas.   Keep the affected joint raised (elevated).   Use crutches if the painful joint is in your leg.   Drink enough water and fluids to keep your urine clear or pale yellow. This helps your body get rid of uric acid. Do not drink alcoholic beverages. They slow the passage of uric acid.   Follow your caregiver's dietary instructions. Pay careful attention to the amount of protein you eat. Your daily diet should emphasize fruits, vegetables, whole grains, and fat-free or low-fat milk products.   Maintain a healthy body weight.  SEEK MEDICAL CARE IF:   You have an oral temperature above 102 F (38.9 C).   You develop diarrhea, vomiting, or any side effects from medicines.   You do not feel better in 24 hours, or you are getting worse.  SEEK IMMEDIATE MEDICAL CARE IF:   Your joint becomes suddenly more tender and you have:   Chills.   An oral temperature above 102 F (38.9 C), not controlled by medicine.  MAKE SURE YOU:   Understand these instructions.   Will watch your condition.   Will get help right away if you are not doing well or get worse.  Document Released: 07/12/2000 Document Revised: 07/04/2011 Document Reviewed: 10/23/2009 [facility] Patient Information 2012 [place], [place].  Try the Benadryl/Diphenhydramine 25 mg tab 1-2 as needed for sleep  Substitue MegaRed [employer]aps [employer]) 1 daily bay [place]

## 2011-12-20 NOTE — Assessment & Plan Note (Signed)
Mild elevation, avoid simple carbs, reassess with next blood draw

## 2011-12-20 NOTE — Progress Notes (Signed)
Patient ID: Taylor Mcdonald, male   DOB: Nov 21, 1955, 56 y.o.   MRN: [id number] JAYESH MARBACH [id number] [birth year] 12/20/2011      Progress Note-Follow Up  Subjective  Chief Complaint  Chief Complaint  Patient presents with  . Follow-up    3-4 month    HPI  And is a 56 year old Caucasian male who overall is doing well. He's not had any significant gouty flares since his last visit but is asking for a refill on indomethacin to help with this pain when it does occur. He reports that works well. Colchicine helped the pain but did cause diarrhea as expected. He denies any fevers chills, recent illness, chest pain, palpitations, shortness of breath, GI or GU complaints since last seen. He remembers to toe sedated he's had a previous diagnosis of vitamin D deficiency confirm with labs many years ago.  Past Medical History  Diagnosis Date  . Gout   . Hypertension   . Low testosterone   . Migraine   . Allergy   . RLS (restless legs syndrome)   . Insomnia   . Hyperlipidemia   . GERD (gastroesophageal reflux disease)   . Hyperglycemia   . Fatigue   . Pain of left heel 10/01/2011    Past Surgical History  Procedure Date  . Tonsillectomy   . Panendoscopy     Family History  Problem Relation Age of Onset  . Obesity Mother   . Hypertension Son   . Hypertension Maternal Grandmother   . Obesity Maternal Grandmother   . Cancer Maternal Grandmother 50    intestinal  . Dementia Maternal Grandfather     History   Social History  . Marital Status: Married    Spouse Name: N/A    Number of Children: N/A  . Years of Education: N/A   Occupational History  . Not on file.   Social History Main Topics  . Smoking status: Never Smoker   . Smokeless tobacco: Never Used  . Alcohol Use: No  . Drug Use: Yes    Special: Hydrocodone  . Sexually Active: Yes   Other Topics Concern  . Not on file   Social History Narrative  . No narrative on file    Current Outpatient  Prescriptions on File Prior to Visit  Medication Sig Dispense Refill  . aspirin 81 MG tablet Take 81 mg by mouth daily.        . fexofenadine-pseudoephedrine (ALLEGRA-D) 60-120 MG per tablet Take 1 tablet by mouth every 12 (twelve) hours as needed.        . propranolol (INDERAL) 40 MG tablet TAKE 1 TABLET BY MOUTH EVERY DAY  30 tablet  8  . DISCONTD: fluticasone (FLONASE) 50 MCG/ACT nasal spray 2 sprays by Nasal route daily.  16 g  11  . DISCONTD: lisinopril (PRINIVIL,ZESTRIL) 20 MG tablet Take 1 tablet (20 mg total) by mouth daily.  30 tablet  11  . DISCONTD: Omeprazole (KLS OMPERAZOLE) 20 MG TBEC Take 1 tablet (20 mg total) by mouth daily.  30 each  11  . DISCONTD: zolpidem (AMBIEN) 10 MG tablet Take 1 tablet (10 mg total) by mouth at bedtime as needed for sleep.  30 tablet  5  . HYDROcodone-acetaminophen (LORCET) 10-650 MG per tablet Take 1 tablet by mouth every 4 (four) hours as needed. For pain  100 tablet  5  . HYDROcodone-homatropine (HYCODAN) 5-1.5 MG/5ML syrup Take 5 mLs by mouth every 4 (four) hours as needed.  120 mL  1  . DISCONTD: lisinopril (PRINIVIL,ZESTRIL) 10 MG tablet TAKE 1 TABLET BY MOUTH DAILY  30 tablet  9   Current Facility-Administered Medications on File Prior to Visit  Medication Dose Route Frequency Provider Last Rate Last Dose  . DISCONTD: cyanocobalamin ((VITAMIN B-12)) injection 1,000 mcg  1,000 mcg Intramuscular Once Madelin Headings, MD        No Known Allergies  Review of Systems  Review of Systems  Constitutional: Negative for fever and malaise/fatigue.  HENT: Negative for congestion.   Eyes: Negative for pain and discharge.  Respiratory: Negative for shortness of breath.   Cardiovascular: Negative for chest pain, palpitations and leg swelling.  Gastrointestinal: Negative for nausea, abdominal pain and diarrhea.  Genitourinary: Negative for dysuria.  Musculoskeletal: Negative for falls.  Skin: Negative for rash.  Neurological: Negative for loss of  consciousness and headaches.  Endo/Heme/Allergies: Negative for polydipsia.  Psychiatric/Behavioral: Negative for depression and suicidal ideas. The patient is not nervous/anxious and does not have insomnia.     Objective  BP 123/82  Pulse 78  Temp(Src) 98.1 F (36.7 C) (Temporal)  Ht 6' 0.5" (1.842 m)  Wt 198 lb 6.4 oz (89.994 kg)  BMI 26.54 kg/m2  SpO2 100%  Physical Exam  Physical Exam  Constitutional: He is oriented to person, place, and time and well-developed, well-nourished, and in no distress. No distress.  HENT:  Head: Normocephalic and atraumatic.  Eyes: Conjunctivae are normal.  Neck: Neck supple. No thyromegaly present.  Cardiovascular: Normal rate, regular rhythm and normal heart sounds.   No murmur heard. Pulmonary/Chest: Effort normal and breath sounds normal. No respiratory distress.  Abdominal: He exhibits no distension and no mass. There is no tenderness.  Musculoskeletal: He exhibits no edema.  Neurological: He is alert and oriented to person, place, and time.  Skin: Skin is warm.  Psychiatric: Memory, affect and judgment normal.    Lab Results  Component Value Date   TSH 0.34* 12/13/2011   Lab Results  Component Value Date   WBC 7.1 12/13/2011   HGB 14.0 12/13/2011   HCT 42.2 12/13/2011   MCV 86.8 12/13/2011   PLT 236.0 12/13/2011   Lab Results  Component Value Date   CREATININE 1.1 12/13/2011   BUN 18 12/13/2011   NA 141 12/13/2011   K 4.3 12/13/2011   CL 107 12/13/2011   CO2 28 12/13/2011   Lab Results  Component Value Date   ALT 18 12/13/2011   AST 18 12/13/2011   ALKPHOS 36* 12/13/2011   BILITOT 0.7 12/13/2011   Lab Results  Component Value Date   CHOL 186 12/13/2011   Lab Results  Component Value Date   HDL 47.10 12/13/2011   Lab Results  Component Value Date   LDLCALC 103* 12/13/2011   Lab Results  Component Value Date   TRIG 181.0* 12/13/2011   Lab Results  Component Value Date   CHOLHDL 4 12/13/2011     Assessment &  Plan  Low testosterone Improved with topical treatment. Patient is interested in tryingto not use it anymore than he has to. We will recheck level with next  Insomnia Using zolpidem roughly 1/2 tab most nights with good effect. Wants to try and use as little as possible. Can alternate with Diphenhydramine. Spoke of sleep hygiene  GERD (gastroesophageal reflux disease) Manages with Omeprazole 20 mg daily. Avoid offending foods  Hypertension Adequately controlled, no changes  Gout No further flares. Encouraged to avoid dehydration and foods containing uric acid  Hyperglycemia Mild  elevation, avoid simple carbs, reassess with next blood draw  Hyperlipidemia Improved, avoid trans fats, increase exercise, start MegaRed caps

## 2011-12-23 ENCOUNTER — Encounter: Payer: Self-pay | Admitting: Family Medicine

## 2011-12-23 DIAGNOSIS — E559 Vitamin D deficiency, unspecified: Secondary | ICD-10-CM | POA: Insufficient documentation

## 2011-12-31 ENCOUNTER — Other Ambulatory Visit: Payer: Self-pay | Admitting: *Deleted

## 2011-12-31 MED ORDER — PROPRANOLOL HCL 40 MG PO TABS: 40.0000 mg | ORAL_TABLET | Freq: Every day | ORAL | Status: DC

## 2011-12-31 MED ORDER — FEXOFENADINE-PSEUDOEPHED ER 60-120 MG PO TB12: 1.0000 | ORAL_TABLET | Freq: Two times a day (BID) | ORAL | Status: DC

## 2011-12-31 NOTE — Telephone Encounter (Signed)
Faxed refill request received from pharmacy for propranolol and allegra-d Per Dr. Abner Greenspan OK to fill.   RX sent.

## 2012-03-03 ENCOUNTER — Telehealth: Payer: Self-pay

## 2012-03-03 NOTE — Telephone Encounter (Signed)
Pt left a message stating that he believes he has a sinus infection w/ a low grade fever. Pt would like something called into the med center. Please advise?

## 2012-03-03 NOTE — Telephone Encounter (Signed)
I have not seen him for a awhile and I need exam to decide how bad it is and what antibiotic to use so he should come in for eval quickly

## 2012-03-03 NOTE — Telephone Encounter (Signed)
Pt informed and appt scheduled for tomorrow.

## 2012-03-04 ENCOUNTER — Ambulatory Visit (INDEPENDENT_AMBULATORY_CARE_PROVIDER_SITE_OTHER): Payer: BC Managed Care – PPO | Admitting: Family Medicine

## 2012-03-04 ENCOUNTER — Telehealth: Payer: Self-pay

## 2012-03-04 ENCOUNTER — Encounter: Payer: Self-pay | Admitting: Family Medicine

## 2012-03-04 VITALS — BP 112/85 | HR 73 | Temp 97.6°F | Ht 72.5 in | Wt 192.8 lb

## 2012-03-04 DIAGNOSIS — F419 Anxiety disorder, unspecified: Secondary | ICD-10-CM | POA: Insufficient documentation

## 2012-03-04 DIAGNOSIS — J329 Chronic sinusitis, unspecified: Secondary | ICD-10-CM

## 2012-03-04 DIAGNOSIS — F411 Generalized anxiety disorder: Secondary | ICD-10-CM

## 2012-03-04 DIAGNOSIS — I10 Essential (primary) hypertension: Secondary | ICD-10-CM

## 2012-03-04 HISTORY — DX: Chronic sinusitis, unspecified: J32.9

## 2012-03-04 HISTORY — DX: Anxiety disorder, unspecified: F41.9

## 2012-03-04 MED ORDER — GUAIFENESIN ER 600 MG PO TB12: 600.0000 mg | ORAL_TABLET | Freq: Two times a day (BID) | ORAL | Status: DC

## 2012-03-04 MED ORDER — CEFDINIR 300 MG PO CAPS: 300.0000 mg | ORAL_CAPSULE | Freq: Two times a day (BID) | ORAL | Status: AC

## 2012-03-04 MED ORDER — LORAZEPAM 0.5 MG PO TABS: 0.5000 mg | ORAL_TABLET | Freq: Two times a day (BID) | ORAL | Status: AC | PRN

## 2012-03-04 NOTE — Assessment & Plan Note (Signed)
Will start an antibiotic, Mucinex

## 2012-03-04 NOTE — Progress Notes (Signed)
Patient ID: Taylor Mcdonald, male   DOB: 14-Sep-1955, 57 y.o.   MRN: [id number] Taylor Mcdonald [id number] 1955-12-04 03/04/2012      Progress Note-Follow Up  Subjective  Chief Complaint  Chief Complaint  Patient presents with  . Sinusitis    w/ low grade fever- X 1.5 weeks off and on, head congestion    HPI  Patient is a 56 year old Caucasian male who is in today complaining of a one to two-week history and chills. He is worsening sinus and chest congestion. His nose is running generally of clear rhinorrhea. He does have postnasal drip and a cough which is productive of clear rhinorrhea as well. He has some low-grade headaches as well as sore throat. Denies fevers, chills, anorexia, GI or GU concerns. He has been using Afrin each bedtime and that does help him sleep some. He's also noting worsening anxiety recently. Does not every day happens infrequently but he has a feeling of inability to calm himself when things get stressful. He notes his daughter is getting married this fall and finds himself worrying more than usual. No chest pain, palpitations, shortness of breath.  Past Medical History  Diagnosis Date  . Gout   . Hypertension   . Low testosterone   . Migraine   . Allergy   . RLS (restless legs syndrome)   . Insomnia   . Hyperlipidemia   . GERD (gastroesophageal reflux disease)   . Hyperglycemia   . Fatigue   . Pain of left heel 10/01/2011  . Vitamin d deficiency 12/23/2011  . Sinusitis 03/04/2012  . Anxiety 03/04/2012    Past Surgical History  Procedure Date  . Tonsillectomy   . Panendoscopy     Family History  Problem Relation Age of Onset  . Obesity Mother   . Hypertension Son   . Hypertension Maternal Grandmother   . Obesity Maternal Grandmother   . Cancer Maternal Grandmother 50    intestinal  . Dementia Maternal Grandfather     History   Social History  . Marital Status: Married    Spouse Name: N/A    Number of Children: N/A  . Years of Education: N/A    Occupational History  . Not on file.   Social History Main Topics  . Smoking status: Never Smoker   . Smokeless tobacco: Never Used  . Alcohol Use: No  . Drug Use: Yes    Special: Hydrocodone  . Sexually Active: Yes   Other Topics Concern  . Not on file   Social History Narrative  . No narrative on file    Current Outpatient Prescriptions on File Prior to Visit  Medication Sig Dispense Refill  . aspirin 81 MG tablet Take 81 mg by mouth daily.        . fexofenadine-pseudoephedrine (ALLEGRA-D) 60-120 MG per tablet Take 1 tablet by mouth 2 (two) times daily.  60 tablet  5  . fluticasone (FLONASE) 50 MCG/ACT nasal spray Place 2 sprays into the nose daily.  48 g  3  . lisinopril (PRINIVIL,ZESTRIL) 20 MG tablet Take 1 tablet (20 mg total) by mouth daily.  90 tablet  3  . Omeprazole (KLS OMPERAZOLE) 20 MG TBEC Take 1 tablet (20 mg total) by mouth daily.  90 each  3  . propranolol (INDERAL) 40 MG tablet Take 1 tablet (40 mg total) by mouth daily.  30 tablet  5  . DISCONTD: zolpidem (AMBIEN) 10 MG tablet Take 1 tablet (10 mg total) by mouth at  bedtime as needed for sleep.  90 tablet  0  . HYDROcodone-acetaminophen (LORCET) 10-650 MG per tablet Take 1 tablet by mouth every 4 (four) hours as needed. For pain  100 tablet  5  . HYDROcodone-homatropine (HYCODAN) 5-1.5 MG/5ML syrup Take 5 mLs by mouth every 4 (four) hours as needed.  120 mL  1  . indomethacin (INDOCIN) 50 MG capsule Take 1 tablet bid prn pain with food  60 capsule  1    No Known Allergies  Review of Systems  Review of Systems  Constitutional: Positive for fever, chills and malaise/fatigue.  HENT: Positive for congestion and sore throat.   Eyes: Negative for discharge.  Respiratory: Positive for cough and sputum production. Negative for shortness of breath and wheezing.   Cardiovascular: Negative for chest pain, palpitations and leg swelling.  Gastrointestinal: Negative for nausea, abdominal pain and diarrhea.    Genitourinary: Negative for dysuria.  Musculoskeletal: Negative for falls.  Skin: Negative for rash.  Neurological: Positive for headaches. Negative for loss of consciousness.  Endo/Heme/Allergies: Negative for polydipsia.  Psychiatric/Behavioral: Negative for depression and suicidal ideas. The patient is nervous/anxious. The patient does not have insomnia.     Objective  BP 112/85  Pulse 73  Temp 97.6 F (36.4 C) (Temporal)  Ht 6' 0.5" (1.842 m)  Wt 192 lb 12.8 oz (87.454 kg)  BMI 25.79 kg/m2  SpO2 98%  Physical Exam  Physical Exam  Constitutional: He is oriented to person, place, and time and well-developed, well-nourished, and in no distress. No distress.  HENT:  Head: Normocephalic and atraumatic.       TMs dull and retracted. Oropharynx erythematous, no edema  Eyes: Conjunctivae are normal.  Neck: Neck supple. No thyromegaly present.  Cardiovascular: Normal rate, regular rhythm and normal heart sounds.   No murmur heard. Pulmonary/Chest: Effort normal and breath sounds normal. No respiratory distress.  Abdominal: He exhibits no distension and no mass. There is no tenderness.  Musculoskeletal: He exhibits no edema.  Lymphadenopathy:    He has cervical adenopathy.  Neurological: He is alert and oriented to person, place, and time.  Skin: Skin is warm.  Psychiatric: Memory, affect and judgment normal.    Lab Results  Component Value Date   TSH 0.34* 12/13/2011   Lab Results  Component Value Date   WBC 7.1 12/13/2011   HGB 14.0 12/13/2011   HCT 42.2 12/13/2011   MCV 86.8 12/13/2011   PLT 236.0 12/13/2011   Lab Results  Component Value Date   CREATININE 1.1 12/13/2011   BUN 18 12/13/2011   NA 141 12/13/2011   K 4.3 12/13/2011   CL 107 12/13/2011   CO2 28 12/13/2011   Lab Results  Component Value Date   ALT 18 12/13/2011   AST 18 12/13/2011   ALKPHOS 36* 12/13/2011   BILITOT 0.7 12/13/2011   Lab Results  Component Value Date   CHOL 186 12/13/2011   Lab Results   Component Value Date   HDL 47.10 12/13/2011   Lab Results  Component Value Date   LDLCALC 103* 12/13/2011   Lab Results  Component Value Date   TRIG 181.0* 12/13/2011   Lab Results  Component Value Date   CHOLHDL 4 12/13/2011     Assessment & Plan  Sinusitis Will start an antibiotic, Mucinex  Hypertension Well controlled on current meds, no changes  Anxiety His daughter is getting married this fall and he is finding himself more anxious and unable to quiet himself. Will allow a  small amount of Alprazolam to use prn and he is warned about possible side effects. If he needs to take the medication frequently he is to return to discuss further options

## 2012-03-04 NOTE — Telephone Encounter (Signed)
Message copied by Court Joy on Wed Mar 04, 2012  2:09 PM ------      Message from: Taylor Mcdonald      Created: Wed Mar 04, 2012  1:17 PM       Let him know I reviewed his labs again after he left and I actually think he should come back some time in the next couple of weeks and let us draw some labs to make sure things like testosterone deficiency or thyroid disease are not affecting his symptoms

## 2012-03-04 NOTE — Telephone Encounter (Signed)
Pt informed and is coming in on 03-09-12

## 2012-03-04 NOTE — Assessment & Plan Note (Signed)
His daughter is getting married this fall and he is finding himself more anxious and unable to quiet himself. Will allow a small amount of Alprazolam to use prn and he is warned about possible side effects. If he needs to take the medication frequently he is to return to discuss further options

## 2012-03-04 NOTE — Assessment & Plan Note (Signed)
Well controlled on current meds, no changes 

## 2012-03-04 NOTE — Patient Instructions (Signed)

## 2012-03-09 ENCOUNTER — Other Ambulatory Visit (INDEPENDENT_AMBULATORY_CARE_PROVIDER_SITE_OTHER): Payer: BC Managed Care – PPO

## 2012-03-09 DIAGNOSIS — R946 Abnormal results of thyroid function studies: Secondary | ICD-10-CM

## 2012-03-09 DIAGNOSIS — R7309 Other abnormal glucose: Secondary | ICD-10-CM

## 2012-03-09 DIAGNOSIS — I10 Essential (primary) hypertension: Secondary | ICD-10-CM

## 2012-03-09 DIAGNOSIS — M109 Gout, unspecified: Secondary | ICD-10-CM

## 2012-03-09 DIAGNOSIS — E291 Testicular hypofunction: Secondary | ICD-10-CM

## 2012-03-09 DIAGNOSIS — R7989 Other specified abnormal findings of blood chemistry: Secondary | ICD-10-CM

## 2012-03-09 LAB — RENAL FUNCTION PANEL
Albumin: 4 g/dL (ref 3.5–5.2)
Chloride: 104 mEq/L (ref 96–112)
GFR: 78.08 mL/min (ref >60.00)
Phosphorus: 2.8 mg/dL (ref 2.3–4.6)
Potassium: 4.5 mEq/L (ref 3.5–5.1)
Sodium: 138 mEq/L (ref 135–145)

## 2012-03-09 LAB — URIC ACID: Uric Acid, Serum: 7.9 mg/dL — ABNORMAL HIGH (ref 4.0–7.8)

## 2012-03-09 LAB — HEMOGLOBIN A1C: Hgb A1c MFr Bld: 5.4 % (ref 4.6–6.5)

## 2012-03-09 LAB — CBC
HCT: 41.3 % (ref 39.0–52.0)
RBC: 4.79 Mil/uL (ref 4.22–5.81)
RDW: 14.3 % (ref 11.5–14.6)
WBC: 6.9 10*3/uL (ref 4.5–10.5)

## 2012-03-09 LAB — TESTOSTERONE: Testosterone: 223.55 ng/dL — ABNORMAL LOW (ref 350.00–890.00)

## 2012-03-09 LAB — HEPATIC FUNCTION PANEL
Bilirubin, Direct: 0.1 mg/dL (ref 0.0–0.3)
Total Protein: 6.4 g/dL (ref 6.0–8.3)

## 2012-03-09 LAB — TSH: TSH: 0.53 u[IU]/mL (ref 0.35–5.50)

## 2012-03-10 ENCOUNTER — Telehealth: Payer: Self-pay

## 2012-03-10 NOTE — Telephone Encounter (Signed)
Message copied by Court Joy on Tue Mar 10, 2012  3:12 PM ------      Message from: Danise Edge A      Created: Tue Mar 10, 2012 12:32 PM       Please let him know his labs look good except his testosterone is low, have him come in to discuss options if he wants to treat it

## 2012-03-11 NOTE — Telephone Encounter (Signed)
Left a detailed message on patients voicemail.

## 2012-03-19 ENCOUNTER — Ambulatory Visit (INDEPENDENT_AMBULATORY_CARE_PROVIDER_SITE_OTHER): Payer: BC Managed Care – PPO | Admitting: Family Medicine

## 2012-03-19 ENCOUNTER — Encounter: Payer: Self-pay | Admitting: Family Medicine

## 2012-03-19 VITALS — BP 128/89 | HR 62 | Temp 97.4°F | Ht 72.5 in | Wt 193.8 lb

## 2012-03-19 DIAGNOSIS — R41840 Attention and concentration deficit: Secondary | ICD-10-CM

## 2012-03-19 DIAGNOSIS — R739 Hyperglycemia, unspecified: Secondary | ICD-10-CM

## 2012-03-19 DIAGNOSIS — T7840XA Allergy, unspecified, initial encounter: Secondary | ICD-10-CM

## 2012-03-19 DIAGNOSIS — G4700 Insomnia, unspecified: Secondary | ICD-10-CM

## 2012-03-19 DIAGNOSIS — R7309 Other abnormal glucose: Secondary | ICD-10-CM

## 2012-03-19 DIAGNOSIS — R5383 Other fatigue: Secondary | ICD-10-CM

## 2012-03-19 DIAGNOSIS — R5381 Other malaise: Secondary | ICD-10-CM

## 2012-03-19 DIAGNOSIS — E291 Testicular hypofunction: Secondary | ICD-10-CM

## 2012-03-19 DIAGNOSIS — E349 Endocrine disorder, unspecified: Secondary | ICD-10-CM

## 2012-03-19 DIAGNOSIS — I10 Essential (primary) hypertension: Secondary | ICD-10-CM

## 2012-03-19 MED ORDER — DIAZEPAM 10 MG PO TABS: 10.0000 mg | ORAL_TABLET | Freq: Two times a day (BID) | ORAL | Status: AC | PRN

## 2012-03-19 MED ORDER — METHYLPHENIDATE HCL 10 MG PO TABS: 10.0000 mg | ORAL_TABLET | Freq: Every day | ORAL | Status: DC

## 2012-03-19 MED ORDER — TESTOSTERONE CYPIONATE 200 MG/ML IM SOLN: 100.0000 mg | INTRAMUSCULAR | Status: DC

## 2012-03-19 MED ORDER — TESTOSTERONE CYPIONATE 200 MG/ML IM SOLN
50.0000 mg | INTRAMUSCULAR | Status: DC
  Administered 2012-03-19: 50 mg via INTRAMUSCULAR

## 2012-03-19 NOTE — Assessment & Plan Note (Signed)
Mild increase in

## 2012-03-19 NOTE — Patient Instructions (Signed)
Testosterone This test is used to determine if your testosterone level is abnormal. This could be used to explain difficulty getting an erection (erectile dysfunction), inability of your partner to get pregnant (infertility), premature or delayed puberty if you are male, or the appearance of masculine physical features if you are male. PREPARATION FOR TEST A blood sample is obtained by inserting a needle into a vein in the arm. NORMAL FINDINGS  Free Testosterone: 0.3-2 pg/mL   % Free Testosterone: 0.1%-0.3%  Total Testosterone:  7 mos-9 yrs (Tanner Stage I)   Male: Less than 30 ng/dL   Male: Less than 30 ng/dL   10-13 yrs (Tanner Stage II)   Male: Less than 300 ng/dL   Male: Less than 40 ng/dL   14-15 yrs (Tanner Stage III)   Male: 170-540 ng/dL   Male: Less than 60 ng/dL   16-19 yrs (Tanner Stage IV, V)   Male: 250-910 ng/dL   Male: Less than 70 ng/dL   20 yrs and over   Male: 280-1080 ng/dL   Male: Less than 70 ng/dL  Ranges for normal findings may vary among different laboratories and hospitals. You should always check with your doctor after having lab work or other tests done to discuss the meaning of your test results and whether your values are considered within normal limits. MEANING OF TEST  Your caregiver will go over the test results with you and discuss the importance and meaning of your results, as well as treatment options and the need for additional tests if necessary. OBTAINING THE TEST RESULTS It is your responsibility to obtain your test results. Ask the lab or department performing the test when and how you will get your results. Document Released: 08/01/2004 Document Revised: 07/04/2011 Document Reviewed: 06/26/2008 [employer] Patient Information 2012 [employer]. 

## 2012-03-22 NOTE — Assessment & Plan Note (Signed)
Will try Diazepam prn and reassess at next visit

## 2012-03-22 NOTE — Assessment & Plan Note (Signed)
Adequately controlled, no changes 

## 2012-03-22 NOTE — Progress Notes (Signed)
Patient ID: Taylor Mcdonald, male   DOB: [birth year], 56 y.o.   MRN: [id number] Taylor Mcdonald [id number] Mar 29, 1956 03/22/2012      Progress Note-Follow Up  Subjective  Chief Complaint  Chief Complaint  Patient presents with  . Follow-up    on lab results    HPI  Patient is a 56 year old Caucasian male in today for followup. He continues to struggle with excessive fatigue and he feels is worsening. He has trouble concentrating and getting his work in the afternoons. He denies chest pain or palpitations. Does acknowledge some mild shortness or breath with exertion but that is related to deconditioning he feels. He continues to struggle with poor sleep and waking frequently secondary to his restless leg syndrome. No recent illness, fevers, chills, GI or GU complaints.  Past Medical History  Diagnosis Date  . Gout   . Hypertension   . Low testosterone   . Migraine   . Allergy   . RLS (restless legs syndrome)   . Insomnia   . Hyperlipidemia   . GERD (gastroesophageal reflux disease)   . Hyperglycemia   . Fatigue   . Pain of left heel 10/01/2011  . Vitamin d deficiency 12/23/2011  . Sinusitis 03/04/2012  . Anxiety 03/04/2012    Past Surgical History  Procedure Date  . Tonsillectomy   . Panendoscopy     Family History  Problem Relation Age of Onset  . Obesity Mother   . Hypertension Son   . Hypertension Maternal Grandmother   . Obesity Maternal Grandmother   . Cancer Maternal Grandmother 50    intestinal  . Dementia Maternal Grandfather     History   Social History  . Marital Status: Married    Spouse Name: N/A    Number of Children: N/A  . Years of Education: N/A   Occupational History  . Not on file.   Social History Main Topics  . Smoking status: Never Smoker   . Smokeless tobacco: Never Used  . Alcohol Use: No  . Drug Use: Yes    Special: Hydrocodone  . Sexually Active: Yes   Other Topics Concern  . Not on file   Social History Narrative  . No  narrative on file    Current Outpatient Prescriptions on File Prior to Visit  Medication Sig Dispense Refill  . aspirin 81 MG tablet Take 81 mg by mouth [name]        . fexofenadine-pseudoephedrine (ALLEGRA-D) 60-120 MG per tablet Take 1 tablet by mouth 2 (two) times [name]  60 tablet  5  . fluticasone (FLONASE) 50 MCG/ACT nasal spray Place 2 sprays into the nose [name]  48 g  3  . guaiFENesin (MUCINEX) 600 MG 12 hr tablet Take 1 tablet (600 mg total) by mouth 2 (two) times [name]  20 tablet  0  . HYDROcodone-acetaminophen (LORCET) 10-650 MG per tablet Take 1 tablet by mouth every 4 (four) hours as needed. For pain  100 tablet  5  . indomethacin (INDOCIN) 50 MG capsule Take 1 tablet bid prn pain with food  60 capsule  1  . lisinopril (PRINIVIL,ZESTRIL) 20 MG tablet Take 1 tablet (20 mg total) by mouth [name]  90 tablet  3  . Omeprazole (KLS OMPERAZOLE) 20 MG TBEC Take 1 tablet (20 mg total) by mouth [name]  90 each  3  . propranolol (INDERAL) 40 MG tablet Take 1 tablet (40 mg total) by mouth [name]  30 tablet  5  . HYDROcodone-homatropine (HYCODAN)  5-1.5 MG/5ML syrup Take 5 mLs by mouth every 4 (four) hours as needed.  120 mL  1  . methylphenidate (RITALIN) 10 MG tablet Take 1 tablet (10 mg total) by mouth [name]  30 tablet  0  . testosterone cypionate (DEPO-TESTOSTERONE) 200 MG/ML injection Inject 0.5 mLs (100 mg total) into the muscle every 14 (fourteen) days.  10 mL  0   No current facility-administered medications on file prior to visit.    No Known Allergies  Review of Systems  Review of Systems  Constitutional: Positive for malaise/fatigue. Negative for fever.  HENT: Negative for congestion.   Eyes: Negative for discharge.  Respiratory: Negative for shortness of breath.   Cardiovascular: Negative for chest pain, palpitations and leg swelling.  Gastrointestinal: Negative for nausea, abdominal pain and diarrhea.  Genitourinary: Negative for dysuria.  Musculoskeletal: Negative for  falls.  Skin: Negative for rash.  Neurological: Negative for loss of consciousness and headaches.  Endo/Heme/Allergies: Negative for polydipsia.  Psychiatric/Behavioral: Negative for depression and suicidal ideas. The patient is not nervous/anxious and does not have insomnia.     Objective  BP 128/89  Pulse 62  Temp 97.4 F (36.3 C) (Temporal)  Ht 6' 0.5" (1.842 m)  Wt 193 lb 12.8 oz (87.907 kg)  BMI 25.92 kg/m2  SpO2 99%  Physical Exam  Physical Exam  Constitutional: He is oriented to person, place, and time and well-developed, well-nourished, and in no distress. No distress.  HENT:  Head: Normocephalic and atraumatic.  Eyes: Conjunctivae are normal.  Neck: Neck supple. No thyromegaly present.  Cardiovascular: Normal rate, regular rhythm and normal heart sounds.   No murmur heard. Pulmonary/Chest: Effort normal and breath sounds normal. No respiratory distress.  Abdominal: He exhibits no distension and no mass. There is no tenderness.  Musculoskeletal: He exhibits no edema.  Neurological: He is alert and oriented to person, place, and time.  Skin: Skin is warm.  Psychiatric: Memory, affect and judgment normal.    Lab Results  Component Value Date   TSH 0.53 03/09/2012   Lab Results  Component Value Date   WBC 6.9 03/09/2012   HGB 13.8 03/09/2012   HCT 41.3 03/09/2012   MCV 86.2 03/09/2012   PLT 245.0 03/09/2012   Lab Results  Component Value Date   CREATININE 1.0 03/09/2012   BUN 18 03/09/2012   NA 138 03/09/2012   K 4.5 03/09/2012   CL 104 03/09/2012   CO2 27 03/09/2012   Lab Results  Component Value Date   ALT 17 03/09/2012   AST 19 03/09/2012   ALKPHOS 38* 03/09/2012   BILITOT 0.7 03/09/2012   Lab Results  Component Value Date   CHOL 186 12/13/2011   Lab Results  Component Value Date   HDL 47.10 12/13/2011   Lab Results  Component Value Date   LDLCALC 103* 12/13/2011   Lab Results  Component Value Date   TRIG 181.0* 12/13/2011   Lab Results  Component  Value Date   CHOLHDL 4 12/13/2011     Assessment & Plan  Allergic state Mild increase in   Testosterone deficiency Agrees to try supplementation and given first shot today, Testosterone q o wk with injections. Recheck in 12 weeks  Fatigue Will try Ritalin 10 mg q am and reassess at next visit.  Hypertension Adequately controlled, no changes  Insomnia Will try Diazepam prn and reassess at next visit  Hyperglycemia Mild, avoid simple carbs

## 2012-03-22 NOTE — Assessment & Plan Note (Signed)
Will try Ritalin 10 mg q am and reassess at next visit.

## 2012-03-22 NOTE — Assessment & Plan Note (Signed)
Mild, avoid simple carbs

## 2012-03-22 NOTE — Assessment & Plan Note (Signed)
Agrees to try supplementation and given first shot today, Testosterone q o wk with injections. Recheck in 12 weeks

## 2012-04-02 ENCOUNTER — Ambulatory Visit (INDEPENDENT_AMBULATORY_CARE_PROVIDER_SITE_OTHER): Payer: BC Managed Care – PPO

## 2012-04-02 DIAGNOSIS — E291 Testicular hypofunction: Secondary | ICD-10-CM

## 2012-04-02 DIAGNOSIS — E349 Endocrine disorder, unspecified: Secondary | ICD-10-CM

## 2012-04-02 MED ORDER — TESTOSTERONE CYPIONATE 200 MG/ML IM SOLN
100.0000 mg | INTRAMUSCULAR | Status: DC
  Administered 2012-04-02: 100 mg via INTRAMUSCULAR

## 2012-04-02 NOTE — Progress Notes (Signed)
  Subjective:    Patient ID: Taylor Mcdonald, male    DOB: [birth year], 56 y.o.   MRN: [id number]  HPI    Review of Systems     Objective:   Physical Exam        Assessment & Plan:  Patient came in for his testosterone injection. Patient tolerated injection good.

## 2012-04-16 ENCOUNTER — Encounter: Payer: Self-pay | Admitting: Family Medicine

## 2012-04-16 ENCOUNTER — Ambulatory Visit: Payer: BC Managed Care – PPO

## 2012-04-16 ENCOUNTER — Ambulatory Visit (INDEPENDENT_AMBULATORY_CARE_PROVIDER_SITE_OTHER): Payer: BC Managed Care – PPO | Admitting: Family Medicine

## 2012-04-16 VITALS — BP 131/83 | HR 73 | Temp 97.9°F | Ht 72.5 in | Wt 195.1 lb

## 2012-04-16 DIAGNOSIS — R52 Pain, unspecified: Secondary | ICD-10-CM

## 2012-04-16 DIAGNOSIS — R5383 Other fatigue: Secondary | ICD-10-CM

## 2012-04-16 DIAGNOSIS — E349 Endocrine disorder, unspecified: Secondary | ICD-10-CM

## 2012-04-16 DIAGNOSIS — E291 Testicular hypofunction: Secondary | ICD-10-CM

## 2012-04-16 DIAGNOSIS — T7840XA Allergy, unspecified, initial encounter: Secondary | ICD-10-CM

## 2012-04-16 DIAGNOSIS — G4700 Insomnia, unspecified: Secondary | ICD-10-CM

## 2012-04-16 DIAGNOSIS — R5381 Other malaise: Secondary | ICD-10-CM

## 2012-04-16 DIAGNOSIS — E785 Hyperlipidemia, unspecified: Secondary | ICD-10-CM

## 2012-04-16 DIAGNOSIS — I10 Essential (primary) hypertension: Secondary | ICD-10-CM

## 2012-04-16 MED ORDER — PROPRANOLOL HCL 40 MG PO TABS: 40.0000 mg | ORAL_TABLET | Freq: Every day | ORAL | Status: DC

## 2012-04-16 MED ORDER — HYDROCODONE-HOMATROPINE 5-1.5 MG/5ML PO SYRP: 5.0000 mL | ORAL_SOLUTION | ORAL | Status: DC | PRN

## 2012-04-16 MED ORDER — TESTOSTERONE CYPIONATE 200 MG/ML IM SOLN
100.0000 mg | INTRAMUSCULAR | Status: DC
  Administered 2012-04-16: 100 mg via INTRAMUSCULAR

## 2012-04-16 MED ORDER — METHYLPHENIDATE HCL ER (LA) 20 MG PO CP24: 20.0000 mg | ORAL_CAPSULE | ORAL | Status: DC

## 2012-04-16 NOTE — Assessment & Plan Note (Signed)
Took just a couple days of the Zolpidem 5 mg and has slept well since then. May continue to use med sparingly as needed

## 2012-04-16 NOTE — Assessment & Plan Note (Signed)
Well controlled no change in meds, refill on Inderal given today

## 2012-04-16 NOTE — Assessment & Plan Note (Signed)
Has only taken Ritalin a handful of times since his last visit but finds it very helpful. He is able to stay focused and get through his work activities well but it wears off after 1-2 hours. Will switch to Ritalin LA 20 mg tabs po daily as needed and recheck vitals in 1 month

## 2012-04-16 NOTE — Assessment & Plan Note (Signed)
Doing well with shots, took one today, check levels again at October visit

## 2012-04-16 NOTE — Assessment & Plan Note (Deleted)
Well controlled no change in meds, refill on Inderal given today 

## 2012-04-16 NOTE — Patient Instructions (Addendum)

## 2012-04-16 NOTE — Assessment & Plan Note (Signed)
Encouraged to use plain Fexofenadine 180 mg and Flonase an hour before going outside to do yard work and see if that is helpful. He is encouraged to try and avoid Sudafed and Dristan due to his blood pressure. He is encouraged to use nasal saline flushes after worsening are and notify us if no improvement.

## 2012-04-16 NOTE — Progress Notes (Signed)
Patient ID: Taylor Mcdonald, male   DOB: 1955-12-07, 56 y.o.   MRN: [id number] KDEN WAGSTER [id number] 08/13/1955 04/16/2012      Progress Note-Follow Up  Subjective  Chief Complaint  Chief Complaint  Patient presents with  . Follow-up  . Injections    testosterone injection    HPI  Patient is a 56 year old Caucasian male who is in today for further evaluation of multiple medical problems. He has had a good improvement in his sleep-wake cycles. He used resulting 10 mg about a 2 strong so he switched to 5 mg as directed says he used it tonight and since then has not had to use it has been sleeping well. He also is pleased with his testosterone shots has not had any concerning side effects. He denies any localized reaction or any difficulties. He has used the Ritalin only on a handful of occasions since last seen and has had a good response. He says at work when he has a lot to do it helps him to stay focused and have the energy to get through the afternoon. He denies any concerns. No headaches, chest pains, palpitations, GI upset abdominal pain or other concerns. No recent illness. He does continue to struggle with some allergic symptoms most notably facial pressure and congestion after he is out mowing the lawn. He finds that when he gets sinus headaches one dose of Hycodan helps relieve the pain and pressure, he has been using this instead of Lorcet, has only had 1 small bottle in last 8 months. Does use some Allegra D prn when he really flares up  Past Medical History  Diagnosis Date  . Gout   . Hypertension   . Low testosterone   . Migraine   . Allergy   . RLS (restless legs syndrome)   . Insomnia   . Hyperlipidemia   . GERD (gastroesophageal reflux disease)   . Hyperglycemia   . Fatigue   . Pain of left heel 10/01/2011  . Vitamin d deficiency 12/23/2011  . Sinusitis 03/04/2012  . Anxiety 03/04/2012    Past Surgical History  Procedure Date  . Tonsillectomy   . Panendoscopy      Family History  Problem Relation Age of Onset  . Obesity Mother   . Hypertension Son   . Hypertension Maternal Grandmother   . Obesity Maternal Grandmother   . Cancer Maternal Grandmother 50    intestinal  . Dementia Maternal Grandfather     History   Social History  . Marital Status: Married    Spouse Name: N/A    Number of Children: N/A  . Years of Education: N/A   Occupational History  . Not on file.   Social History Main Topics  . Smoking status: Never Smoker   . Smokeless tobacco: Never Used  . Alcohol Use: No  . Drug Use: Yes    Special: Hydrocodone  . Sexually Active: Yes   Other Topics Concern  . Not on file   Social History Narrative  . No narrative on file    Current Outpatient Prescriptions on File Prior to Visit  Medication Sig Dispense Refill  . aspirin 81 MG tablet Take 81 mg by mouth daily.        . fexofenadine-pseudoephedrine (ALLEGRA-D) 60-120 MG per tablet Take 1 tablet by mouth 2 (two) times daily.  60 tablet  5  . fluticasone (FLONASE) 50 MCG/ACT nasal spray Place 2 sprays into the nose daily.  48 g  3  .  guaiFENesin (MUCINEX) 600 MG 12 hr tablet Take 1 tablet (600 mg total) by mouth 2 (two) times daily.  20 tablet  0  . lisinopril (PRINIVIL,ZESTRIL) 20 MG tablet Take 1 tablet (20 mg total) by mouth daily.  90 tablet  3  . Omeprazole (KLS OMPERAZOLE) 20 MG TBEC Take 1 tablet (20 mg total) by mouth daily.  90 each  3  . testosterone cypionate (DEPO-TESTOSTERONE) 200 MG/ML injection Inject 0.5 mLs (100 mg total) into the muscle every 14 (fourteen) days.  10 mL  0  . DISCONTD: propranolol (INDERAL) 40 MG tablet Take 1 tablet (40 mg total) by mouth daily.  30 tablet  5  . HYDROcodone-acetaminophen (LORCET) 10-650 MG per tablet Take 1 tablet by mouth every 4 (four) hours as needed. For pain  100 tablet  5  . indomethacin (INDOCIN) 50 MG capsule Take 1 tablet bid prn pain with food  60 capsule  1   No current facility-administered medications on  file prior to visit.    No Known Allergies  Review of Systems  Review of Systems  Constitutional: Negative for fever and malaise/fatigue.  HENT: Positive for congestion.   Eyes: Negative for discharge.  Respiratory: Negative for shortness of breath.   Cardiovascular: Negative for chest pain, palpitations and leg swelling.  Gastrointestinal: Negative for nausea, abdominal pain and diarrhea.  Genitourinary: Negative for dysuria.  Musculoskeletal: Negative for falls.  Skin: Negative for rash.  Neurological: Negative for loss of consciousness and headaches.  Endo/Heme/Allergies: Negative for polydipsia.  Psychiatric/Behavioral: Negative for depression and suicidal ideas. The patient is not nervous/anxious and does not have insomnia.     Objective  BP 131/83  Pulse 73  Temp 97.9 F (36.6 C) (Temporal)  Ht 6' 0.5" (1.842 m)  Wt 195 lb 1.9 oz (88.506 kg)  BMI 26.10 kg/m2  SpO2 100%  Physical Exam  Physical Exam  Constitutional: He is oriented to person, place, and time and well-developed, well-nourished, and in no distress. No distress.  HENT:  Head: Normocephalic and atraumatic.  Eyes: Conjunctivae normal are normal.  Neck: Neck supple. No thyromegaly present.  Cardiovascular: Normal rate, regular rhythm and normal heart sounds.   No murmur heard. Pulmonary/Chest: Effort normal and breath sounds normal. No respiratory distress.  Abdominal: He exhibits no distension and no mass. There is no tenderness.  Musculoskeletal: He exhibits no edema.  Neurological: He is alert and oriented to person, place, and time.  Skin: Skin is warm.  Psychiatric: Memory, affect and judgment normal.    Lab Results  Component Value Date   TSH 0.53 03/09/2012   Lab Results  Component Value Date   WBC 6.9 03/09/2012   HGB 13.8 03/09/2012   HCT 41.3 03/09/2012   MCV 86.2 03/09/2012   PLT 245.0 03/09/2012   Lab Results  Component Value Date   CREATININE 1.0 03/09/2012   BUN 18 03/09/2012    NA 138 03/09/2012   K 4.5 03/09/2012   CL 104 03/09/2012   CO2 27 03/09/2012   Lab Results  Component Value Date   ALT 17 03/09/2012   AST 19 03/09/2012   ALKPHOS 38* 03/09/2012   BILITOT 0.7 03/09/2012   Lab Results  Component Value Date   CHOL 186 12/13/2011   Lab Results  Component Value Date   HDL 47.10 12/13/2011   Lab Results  Component Value Date   LDLCALC 103* 12/13/2011   Lab Results  Component Value Date   TRIG 181.0* 12/13/2011   Lab  Results  Component Value Date   CHOLHDL 4 12/13/2011     Assessment & Plan  Hypertension Well controlled no change in meds, refill on Inderal given today  Insomnia Took just a couple days of the Zolpidem 5 mg and has slept well since then. May continue to use med sparingly as needed  Testosterone deficiency Doing well with shots, took one today, check levels again at October visit  Fatigue Has only taken Ritalin a handful of times since his last visit but finds it very helpful. He is able to stay focused and get through his work activities well but it wears off after 1-2 hours. Will switch to Ritalin LA 20 mg tabs po daily as needed and recheck vitals in 1 month  Allergic state Encouraged to use plain Fexofenadine 180 mg and Flonase an hour before going outside to do yard work and see if that is helpful. He is encouraged to try and avoid Sudafed and Dristan due to his blood pressure. He is encouraged to use nasal saline flushes after worsening are and notify us if no improvement.

## 2012-04-24 ENCOUNTER — Telehealth: Payer: Self-pay

## 2012-04-24 NOTE — Telephone Encounter (Signed)
I informed pt that the Ritalin 20 mg capsules have been denied. I also faxed this to the pharmacy

## 2012-04-28 ENCOUNTER — Telehealth: Payer: Self-pay | Admitting: Family Medicine

## 2012-04-28 NOTE — Telephone Encounter (Signed)
Please contact patient about Ritalin Rx. He said  the insurance is refusing to pay for the 20 MG.

## 2012-04-28 NOTE — Telephone Encounter (Signed)
Left a message for patient to return my call. 

## 2012-04-29 MED ORDER — METHYLPHENIDATE HCL 10 MG PO TABS: 10.0000 mg | ORAL_TABLET | Freq: Two times a day (BID) | ORAL | Status: DC

## 2012-04-29 NOTE — Telephone Encounter (Signed)
Per patient he would like to go back to Ritalin 10 mg because insurance would pay for them.  Verbal from MD that this is ok and to print 3 months.   Patient informed that RX's would be at the front desk

## 2012-05-13 ENCOUNTER — Ambulatory Visit (INDEPENDENT_AMBULATORY_CARE_PROVIDER_SITE_OTHER): Payer: BC Managed Care – PPO | Admitting: Family Medicine

## 2012-05-13 ENCOUNTER — Encounter: Payer: Self-pay | Admitting: Family Medicine

## 2012-05-13 VITALS — BP 103/73 | HR 66 | Temp 97.8°F | Ht 77.0 in | Wt 197.8 lb

## 2012-05-13 DIAGNOSIS — K219 Gastro-esophageal reflux disease without esophagitis: Secondary | ICD-10-CM

## 2012-05-13 DIAGNOSIS — E291 Testicular hypofunction: Secondary | ICD-10-CM

## 2012-05-13 DIAGNOSIS — R5383 Other fatigue: Secondary | ICD-10-CM

## 2012-05-13 DIAGNOSIS — R112 Nausea with vomiting, unspecified: Secondary | ICD-10-CM

## 2012-05-13 DIAGNOSIS — I10 Essential (primary) hypertension: Secondary | ICD-10-CM

## 2012-05-13 DIAGNOSIS — G43909 Migraine, unspecified, not intractable, without status migrainosus: Secondary | ICD-10-CM

## 2012-05-13 DIAGNOSIS — E349 Endocrine disorder, unspecified: Secondary | ICD-10-CM

## 2012-05-13 MED ORDER — PROMETHAZINE HCL 25 MG RE SUPP: 25.0000 mg | Freq: Four times a day (QID) | RECTAL | Status: DC | PRN

## 2012-05-13 MED ORDER — TESTOSTERONE CYPIONATE 200 MG/ML IM SOLN: 100.0000 mg | INTRAMUSCULAR | Status: DC

## 2012-05-13 NOTE — Assessment & Plan Note (Signed)
Tolerating his shots and feeling better, will recheck levels in 2 mn

## 2012-05-13 NOTE — Progress Notes (Signed)
Patient ID: Taylor Mcdonald, male   DOB: [birth year], 56 y.o.   MRN: [id number] Taylor Mcdonald [id number] 06/23/1956 05/13/2012      Progress Note-Follow Up  Subjective  Chief Complaint  Chief Complaint  Patient presents with  . Follow-up    4 week    HPI  Patient is a 56 year old Caucasian male who is in today for followup. He feels much better. He notes he is sleeping well at this time and very rarely needs to take medication. His energy is better and on the days he feels excessively tired he takes one low dose Ritalin and this gives him to stay nicely. He denies any headaches, chest pain, palpitations, lower extremity pain, fevers, chills or recent illness. No GI or GU complaints. He is tolerating his testosterone shots will  Past Medical History  Diagnosis Date  . Gout   . Hypertension   . Low testosterone   . Migraine   . Allergy   . RLS (restless legs syndrome)   . Insomnia   . Hyperlipidemia   . GERD (gastroesophageal reflux disease)   . Hyperglycemia   . Fatigue   . Pain of left heel 10/01/2011  . Vitamin D deficiency 12/23/2011  . Sinusitis 03/04/2012  . Anxiety 03/04/2012    Past Surgical History  Procedure Date  . Tonsillectomy   . Panendoscopy     Family History  Problem Relation Age of Onset  . Obesity Mother   . Hypertension Son   . Hypertension Maternal Grandmother   . Obesity Maternal Grandmother   . Cancer Maternal Grandmother 50    intestinal  . Dementia Maternal Grandfather     History   Social History  . Marital Status: Married    Spouse Name: N/A    Number of Children: N/A  . Years of Education: N/A   Occupational History  . Not on file.   Social History Main Topics  . Smoking status: Never Smoker   . Smokeless tobacco: Never Used  . Alcohol Use: No  . Drug Use: Yes    Special: Hydrocodone  . Sexually Active: Yes   Other Topics Concern  . Not on file   Social History Narrative  . No narrative on file    Current Outpatient  Prescriptions on File Prior to Visit  Medication Sig Dispense Refill  . aspirin 81 MG tablet Take 81 mg by mouth daily.        . fexofenadine-pseudoephedrine (ALLEGRA-D) 60-120 MG per tablet Take 1 tablet by mouth 2 (two) times daily.  60 tablet  5  . fluticasone (FLONASE) 50 MCG/ACT nasal spray Place 2 sprays into the nose daily.  48 g  3  . HYDROcodone-acetaminophen (LORCET) 10-650 MG per tablet Take 1 tablet by mouth every 4 (four) hours as needed. For pain  100 tablet  5  . HYDROcodone-homatropine (HYCODAN) 5-1.5 MG/5ML syrup Take 5 mLs by mouth every 4 (four) hours as needed.  120 mL  1  . indomethacin (INDOCIN) 50 MG capsule Take 1 tablet bid prn pain with food  60 capsule  1  . lisinopril (PRINIVIL,ZESTRIL) 20 MG tablet Take 1 tablet (20 mg total) by mouth daily.  90 tablet  3  . methylphenidate (RITALIN) 10 MG tablet Take 1 tablet (10 mg total) by mouth 2 (two) times daily. Dec 2013 RX  60 tablet  0  . Omeprazole (KLS OMPERAZOLE) 20 MG TBEC Take 1 tablet (20 mg total) by mouth daily.  90 each  3  . propranolol (INDERAL) 40 MG tablet Take 1 tablet (40 mg total) by mouth daily.  90 tablet  3  . DISCONTD: testosterone cypionate (DEPO-TESTOSTERONE) 200 MG/ML injection Inject 0.5 mLs (100 mg total) into the muscle every 14 (fourteen) days.  10 mL  0  . promethazine (PHENERGAN) 25 MG suppository Place 1 suppository (25 mg total) rectally every 6 (six) hours as needed for nausea.  5 each  0    No Known Allergies  Review of Systems  Review of Systems  Constitutional: Negative for fever and malaise/fatigue.  HENT: Negative for congestion.   Eyes: Negative for discharge.  Respiratory: Negative for shortness of breath.   Cardiovascular: Negative for chest pain, palpitations and leg swelling.  Gastrointestinal: Negative for nausea, abdominal pain and diarrhea.  Genitourinary: Negative for dysuria.  Musculoskeletal: Negative for falls.  Skin: Negative for rash.  Neurological: Negative for  loss of consciousness and headaches.  Endo/Heme/Allergies: Negative for polydipsia.  Psychiatric/Behavioral: Negative for depression and suicidal ideas. The patient is not nervous/anxious and does not have insomnia.     Objective  BP 103/73  Pulse 66  Temp 97.8 F (36.6 C) (Temporal)  Ht 6\' 5"  (1.956 m)  Wt 197 lb 12.8 oz (89.721 kg)  BMI 23.46 kg/m2  SpO2 98%  Physical Exam  Physical Exam  Constitutional: He is oriented to person, place, and time and well-developed, well-nourished, and in no distress. No distress.  HENT:  Head: Normocephalic and atraumatic.  Eyes: Conjunctivae normal are normal.  Neck: Neck supple. No thyromegaly present.  Cardiovascular: Normal rate, regular rhythm and normal heart sounds.   Pulmonary/Chest: Effort normal and breath sounds normal. No respiratory distress.  Abdominal: He exhibits no distension and no mass. There is no tenderness.  Musculoskeletal: He exhibits no edema.  Neurological: He is alert and oriented to person, place, and time.  Skin: Skin is warm.  Psychiatric: Memory, affect and judgment normal.    Lab Results  Component Value Date   TSH 0.53 03/09/2012   Lab Results  Component Value Date   WBC 6.9 03/09/2012   HGB 13.8 03/09/2012   HCT 41.3 03/09/2012   MCV 86.2 03/09/2012   PLT 245.0 03/09/2012   Lab Results  Component Value Date   CREATININE 1.0 03/09/2012   BUN 18 03/09/2012   NA 138 03/09/2012   K 4.5 03/09/2012   CL 104 03/09/2012   CO2 27 03/09/2012   Lab Results  Component Value Date   ALT 17 03/09/2012   AST 19 03/09/2012   ALKPHOS 38* 03/09/2012   BILITOT 0.7 03/09/2012   Lab Results  Component Value Date   CHOL 186 12/13/2011   Lab Results  Component Value Date   HDL 47.10 12/13/2011   Lab Results  Component Value Date   LDLCALC 103* 12/13/2011   Lab Results  Component Value Date   TRIG 181.0* 12/13/2011   Lab Results  Component Value Date   CHOLHDL 4 12/13/2011     Assessment & Plan  Testosterone  deficiency Tolerating his shots and feeling better, will recheck levels in 2 mn  GERD (gastroesophageal reflux disease) Well controlled most days, takes an occasional tums. avoid offending foods  Hypertension Well controlled, no changes  Migraine Did have a bad Migraine over the weekend after his daughter's wedding was completed. He needed a Phenergan suppository to break his pain and N/V. He is given a refill on the suppositories  Fatigue Doing much better and getting benefit from occasional  use of short acting Ritalin. May continue same

## 2012-05-13 NOTE — Assessment & Plan Note (Signed)
Well controlled most days, takes an occasional tums. avoid offending foods

## 2012-05-13 NOTE — Assessment & Plan Note (Signed)
Doing much better and getting benefit from occasional use of short acting Ritalin. May continue same

## 2012-05-13 NOTE — Assessment & Plan Note (Signed)
Well controlled, no changes 

## 2012-05-13 NOTE — Assessment & Plan Note (Signed)
Did have a bad Migraine over the weekend after his daughter's wedding was completed. He needed a Phenergan suppository to break his pain and N/V. He is given a refill on the suppositories

## 2012-06-19 ENCOUNTER — Telehealth: Payer: Self-pay

## 2012-06-19 NOTE — Telephone Encounter (Signed)
We received PA staing that Ritalin 10 mg has been approved for 180 tabs every 90 days from 01-24-12 thorugh 03-25-13. I will fax to pharmacy

## 2012-07-17 ENCOUNTER — Other Ambulatory Visit (INDEPENDENT_AMBULATORY_CARE_PROVIDER_SITE_OTHER): Payer: BC Managed Care – PPO

## 2012-07-17 DIAGNOSIS — E559 Vitamin D deficiency, unspecified: Secondary | ICD-10-CM

## 2012-07-17 DIAGNOSIS — E291 Testicular hypofunction: Secondary | ICD-10-CM

## 2012-07-17 DIAGNOSIS — I10 Essential (primary) hypertension: Secondary | ICD-10-CM

## 2012-07-17 DIAGNOSIS — E349 Endocrine disorder, unspecified: Secondary | ICD-10-CM

## 2012-07-17 LAB — HEPATIC FUNCTION PANEL
ALT: 14 U/L (ref 0–53)
Albumin: 3.7 g/dL (ref 3.5–5.2)
Bilirubin, Direct: 0.1 mg/dL (ref 0.0–0.3)
Total Protein: 5.8 g/dL — ABNORMAL LOW (ref 6.0–8.3)

## 2012-07-17 LAB — LIPID PANEL
Cholesterol: 164 mg/dL (ref 0–200)
HDL: 36.1 mg/dL — ABNORMAL LOW (ref >39.00)
Total CHOL/HDL Ratio: 5
VLDL: 54.4 mg/dL — ABNORMAL HIGH (ref 0.0–40.0)

## 2012-07-17 LAB — CBC
MCHC: 33.6 g/dL (ref 30.0–36.0)
MCV: 85 fl (ref 78.0–100.0)

## 2012-07-17 LAB — RENAL FUNCTION PANEL
Albumin: 3.7 g/dL (ref 3.5–5.2)
GFR: 60.82 mL/min (ref >60.00)
Glucose, Bld: 90 mg/dL (ref 70–99)
Phosphorus: 2.3 mg/dL (ref 2.3–4.6)
Potassium: 4.5 mEq/L (ref 3.5–5.1)

## 2012-07-20 LAB — LDL CHOLESTEROL, DIRECT: Direct LDL: 99 mg/dL

## 2012-07-24 ENCOUNTER — Ambulatory Visit (INDEPENDENT_AMBULATORY_CARE_PROVIDER_SITE_OTHER): Payer: BC Managed Care – PPO | Admitting: Family Medicine

## 2012-07-24 ENCOUNTER — Encounter: Payer: Self-pay | Admitting: Family Medicine

## 2012-07-24 VITALS — BP 128/84 | HR 74 | Temp 97.6°F | Ht 72.5 in | Wt 196.8 lb

## 2012-07-24 DIAGNOSIS — R739 Hyperglycemia, unspecified: Secondary | ICD-10-CM

## 2012-07-24 DIAGNOSIS — G43909 Migraine, unspecified, not intractable, without status migrainosus: Secondary | ICD-10-CM

## 2012-07-24 DIAGNOSIS — Z0000 Encounter for general adult medical examination without abnormal findings: Secondary | ICD-10-CM

## 2012-07-24 DIAGNOSIS — E349 Endocrine disorder, unspecified: Secondary | ICD-10-CM

## 2012-07-24 DIAGNOSIS — I10 Essential (primary) hypertension: Secondary | ICD-10-CM

## 2012-07-24 DIAGNOSIS — E785 Hyperlipidemia, unspecified: Secondary | ICD-10-CM

## 2012-07-24 DIAGNOSIS — E559 Vitamin D deficiency, unspecified: Secondary | ICD-10-CM

## 2012-07-24 DIAGNOSIS — E291 Testicular hypofunction: Secondary | ICD-10-CM

## 2012-07-24 DIAGNOSIS — R7309 Other abnormal glucose: Secondary | ICD-10-CM

## 2012-07-24 DIAGNOSIS — M549 Dorsalgia, unspecified: Secondary | ICD-10-CM

## 2012-07-24 DIAGNOSIS — L578 Other skin changes due to chronic exposure to nonionizing radiation: Secondary | ICD-10-CM

## 2012-07-24 DIAGNOSIS — R5383 Other fatigue: Secondary | ICD-10-CM

## 2012-07-24 DIAGNOSIS — T7840XA Allergy, unspecified, initial encounter: Secondary | ICD-10-CM

## 2012-07-24 HISTORY — DX: Encounter for general adult medical examination without abnormal findings: Z00.00

## 2012-07-24 LAB — TESTOSTERONE: Testosterone: 212.77 ng/dL — ABNORMAL LOW (ref 350.00–890.00)

## 2012-07-24 MED ORDER — LUTEIN 10 MG PO TABS: ORAL_TABLET | ORAL | Status: DC

## 2012-07-24 MED ORDER — CYCLOBENZAPRINE HCL 10 MG PO TABS: 10.0000 mg | ORAL_TABLET | Freq: Every evening | ORAL | Status: DC | PRN

## 2012-07-24 MED ORDER — METHYLPHENIDATE HCL 10 MG PO TABS: 10.0000 mg | ORAL_TABLET | Freq: Two times a day (BID) | ORAL | Status: DC

## 2012-07-24 MED ORDER — CALCIUM CARBONATE-VITAMIN D 600-200 MG-UNIT PO CAPS: ORAL_CAPSULE | ORAL | Status: DC

## 2012-07-24 MED ORDER — NIACIN ER (ANTIHYPERLIPIDEMIC) 500 MG PO TBCR: 500.0000 mg | EXTENDED_RELEASE_TABLET | Freq: Every day | ORAL | Status: DC

## 2012-07-24 NOTE — Assessment & Plan Note (Signed)
If he wants the Zostavax early he is to call his insurance company otherwise he is utd on preventative care. Encouraged heart healthy diet and exercise

## 2012-07-24 NOTE — Assessment & Plan Note (Signed)
Agrees to see dermatology but has forgotten the name of the dermatologist he has seen in past he will go look it up and call us for referral

## 2012-07-24 NOTE — Assessment & Plan Note (Signed)
Intermittent uses Afrin prn warned to use it sparingly

## 2012-07-24 NOTE — Assessment & Plan Note (Signed)
Has not needed the Phenergan suppository yet

## 2012-07-24 NOTE — Assessment & Plan Note (Addendum)
Doing well, Triglycerides up slightly, avoid simple carbs and increase exercise, start Niacin as directed

## 2012-07-24 NOTE — Assessment & Plan Note (Signed)
Well controlled on current meds. No changes

## 2012-07-24 NOTE — Assessment & Plan Note (Signed)
Well treated, no changes 

## 2012-07-24 NOTE — Assessment & Plan Note (Addendum)
Improved with testosterone treatment, using Ritalin infrequently, shredded December rx and given a January prescription

## 2012-07-24 NOTE — Assessment & Plan Note (Addendum)
Tolerating his shots. No changes, check level today

## 2012-07-24 NOTE — Assessment & Plan Note (Signed)
Well controlled despite eating the day of labas

## 2012-07-24 NOTE — Patient Instructions (Addendum)
Preventive Care for Adults, Male A healthy lifestyle and preventive care can promote health and wellness. Preventive health guidelines for men include the following key practices:  A routine yearly physical is a good way to check with your caregiver about your health and preventative screening. It is a chance to share any concerns and updates on your health, and to receive a thorough exam.  Visit your dentist for a routine exam and preventative care every 6 months. Brush your teeth twice a day and floss once a day. Good oral hygiene prevents tooth decay and gum disease.  The frequency of eye exams is based on your age, health, family medical history, use of contact lenses, and other factors. Follow your caregiver's recommendations for frequency of eye exams.  Eat a healthy diet. Foods like vegetables, fruits, whole grains, low-fat dairy products, and lean protein foods contain the nutrients you need without too many calories. Decrease your intake of foods high in solid fats, added sugars, and salt. Eat the right amount of calories for you.Get information about a proper diet from your caregiver, if necessary.  Regular physical exercise is one of the most important things you can do for your health. Most adults should get at least 150 minutes of moderate-intensity exercise (any activity that increases your heart rate and causes you to sweat) each week. In addition, most adults need muscle-strengthening exercises on 2 or more days a week.  Maintain a healthy weight. The body mass index (BMI) is a screening tool to identify possible weight problems. It provides an estimate of body fat based on height and weight. Your caregiver can help determine your BMI, and can help you achieve or maintain a healthy weight.For adults 20 years and older:  A BMI below 18.5 is considered underweight.  A BMI of 18.5 to 24.9 is normal.  A BMI of 25 to 29.9 is considered overweight.  A BMI of 30 and above is  considered obese.  Maintain normal blood lipids and cholesterol levels by exercising and minimizing your intake of saturated fat. Eat a balanced diet with plenty of fruit and vegetables. Blood tests for lipids and cholesterol should begin at age 20 and be repeated every 5 years. If your lipid or cholesterol levels are high, you are over 50, or you are a high risk for heart disease, you may need your cholesterol levels checked more frequently.Ongoing high lipid and cholesterol levels should be treated with medicines if diet and exercise are not effective.  If you smoke, find out from your caregiver how to quit. If you do not use tobacco, do not start.  If you choose to drink alcohol, do not exceed 2 drinks per day. One drink is considered to be 12 ounces (355 mL) of beer, 5 ounces (148 mL) of wine, or 1.5 ounces (44 mL) of liquor.  Avoid use of street drugs. Do not share needles with anyone. Ask for help if you need support or instructions about stopping the use of drugs.  High blood pressure causes heart disease and increases the risk of stroke. Your blood pressure should be checked at least every 1 to 2 years. Ongoing high blood pressure should be treated with medicines, if weight loss and exercise are not effective.  If you are 45 to 57 years old, ask your caregiver if you should take aspirin to prevent heart disease.  Diabetes screening involves taking a blood sample to check your fasting blood sugar level. This should be done once every 3 years,   after age 45, if you are within normal weight and without risk factors for diabetes. Testing should be considered at a younger age or be carried out more frequently if you are overweight and have at least 1 risk factor for diabetes.  Colorectal cancer can be detected and often prevented. Most routine colorectal cancer screening begins at the age of 50 and continues through age 75. However, your caregiver may recommend screening at an earlier age if you  have risk factors for colon cancer. On a yearly basis, your caregiver may provide home test kits to check for hidden blood in the stool. Use of a small camera at the end of a tube, to directly examine the colon (sigmoidoscopy or colonoscopy), can detect the earliest forms of colorectal cancer. Talk to your caregiver about this at age 50, when routine screening begins. Direct examination of the colon should be repeated every 5 to 10 years through age 75, unless early forms of pre-cancerous polyps or small growths are found.  Hepatitis C blood testing is recommended for all people born from 1945 through 1965 and any individual with known risks for hepatitis C.  Practice safe sex. Use condoms and avoid high-risk sexual practices to reduce the spread of sexually transmitted infections (STIs). STIs include gonorrhea, chlamydia, syphilis, trichomonas, herpes, HPV, and human immunodeficiency virus (HIV). Herpes, HIV, and HPV are viral illnesses that have no cure. They can result in disability, cancer, and death.  A one-time screening for abdominal aortic aneurysm (AAA) and surgical repair of large AAAs by sound wave imaging (ultrasonography) is recommended for ages 65 to 75 years who are current or former smokers.  Healthy men should no longer receive prostate-specific antigen (PSA) blood tests as part of routine cancer screening. Consult with your caregiver about prostate cancer screening.  Testicular cancer screening is not recommended for adult males who have no symptoms. Screening includes self-exam, caregiver exam, and other screening tests. Consult with your caregiver about any symptoms you have or any concerns you have about testicular cancer.  Use sunscreen with skin protection factor (SPF) of 30 or more. Apply sunscreen liberally and repeatedly throughout the day. You should seek shade when your shadow is shorter than you. Protect yourself by wearing long sleeves, pants, a wide-brimmed hat, and  sunglasses year round, whenever you are outdoors.  Once a month, do a whole body skin exam, using a mirror to look at the skin on your back. Notify your caregiver of new moles, moles that have irregular borders, moles that are larger than a pencil eraser, or moles that have changed in shape or color.  Stay current with required immunizations.  Influenza. You need a dose every fall (or winter). The composition of the flu vaccine changes each year, so being vaccinated once is not enough.  Pneumococcal polysaccharide. You need 1 to 2 doses if you smoke cigarettes or if you have certain chronic medical conditions. You need 1 dose at age 65 (or older) if you have never been vaccinated.  Tetanus, diphtheria, pertussis (Tdap, Td). Get 1 dose of Tdap vaccine if you are younger than age 65 years, are over 65 and have contact with an infant, are a healthcare worker, or simply want to be protected from whooping cough. After that, you need a Td booster dose every 10 years. Consult your caregiver if you have not had at least 3 tetanus and diphtheria-containing shots sometime in your life or have a deep or dirty wound.  HPV. This vaccine is recommended   for males 13 through 57 years of age. This vaccine may be given to men 22 through 57 years of age who have not completed the 3 dose series. It is recommended for men through age 26 who have sex with men or whose immune system is weakened because of HIV infection, other illness, or medications. The vaccine is given in 3 doses over 6 months.  Measles, mumps, rubella (MMR). You need at least 1 dose of MMR if you were born in 1957 or later. You may also need a 2nd dose.  Meningococcal. If you are age 19 to 21 years and a first-year college student living in a residence hall, or have one of several medical conditions, you need to get vaccinated against meningococcal disease. You may also need additional booster doses.  Zoster (shingles). If you are age 60 years or  older, you should get this vaccine.  Varicella (chickenpox). If you have never had chickenpox or you were vaccinated but received only 1 dose, talk to your caregiver to find out if you need this vaccine.  Hepatitis A. You need this vaccine if you have a specific risk factor for hepatitis A virus infection, or you simply wish to be protected from this disease. The vaccine is usually given as 2 doses, 6 to 18 months apart.  Hepatitis B. You need this vaccine if you have a specific risk factor for hepatitis B virus infection or you simply wish to be protected from this disease. The vaccine is given in 3 doses, usually over 6 months. Preventative Service / Frequency Ages 19 to 39  Blood pressure check.** / Every 1 to 2 years.  Lipid and cholesterol check.** / Every 5 years beginning at age 20.  Hepatitis C blood test.** / For any individual with known risks for hepatitis C.  Skin self-exam. / Monthly.  Influenza immunization.** / Every year.  Pneumococcal polysaccharide immunization.** / 1 to 2 doses if you smoke cigarettes or if you have certain chronic medical conditions.  Tetanus, diphtheria, pertussis (Tdap,Td) immunization. / A one-time dose of Tdap vaccine. After that, you need a Td booster dose every 10 years.  HPV immunization. / 3 doses over 6 months, if 26 and younger.  Measles, mumps, rubella (MMR) immunization. / You need at least 1 dose of MMR if you were born in 1957 or later. You may also need a 2nd dose.  Meningococcal immunization. / 1 dose if you are age 19 to 21 years and a first-year college student living in a residence hall, or have one of several medical conditions, you need to get vaccinated against meningococcal disease. You may also need additional booster doses.  Varicella immunization.** / Consult your caregiver.  Hepatitis A immunization.** / Consult your caregiver. 2 doses, 6 to 18 months apart.  Hepatitis B immunization.** / Consult your caregiver. 3 doses  usually over 6 months. Ages 40 to 64  Blood pressure check.** / Every 1 to 2 years.  Lipid and cholesterol check.** / Every 5 years beginning at age 20.  Fecal occult blood test (FOBT) of stool. / Every year beginning at age 50 and continuing until age 75. You may not have to do this test if you get colonoscopy every 10 years.  Flexible sigmoidoscopy** or colonoscopy.** / Every 5 years for a flexible sigmoidoscopy or every 10 years for a colonoscopy beginning at age 50 and continuing until age 75.  Hepatitis C blood test.** / For all people born from 1945 through 1965 and any   individual with known risks for hepatitis C.  Skin self-exam. / Monthly.  Influenza immunization.** / Every year.  Pneumococcal polysaccharide immunization.** / 1 to 2 doses if you smoke cigarettes or if you have certain chronic medical conditions.  Tetanus, diphtheria, pertussis (Tdap/Td) immunization.** / A one-time dose of Tdap vaccine. After that, you need a Td booster dose every 10 years.  Measles, mumps, rubella (MMR) immunization. / You need at least 1 dose of MMR if you were born in 1957 or later. You may also need a 2nd dose.  Varicella immunization.**/ Consult your caregiver.  Meningococcal immunization.** / Consult your caregiver.  Hepatitis A immunization.** / Consult your caregiver. 2 doses, 6 to 18 months apart.  Hepatitis B immunization.** / Consult your caregiver. 3 doses, usually over 6 months. Ages 65 and over  Blood pressure check.** / Every 1 to 2 years.  Lipid and cholesterol check.**/ Every 5 years beginning at age 20.  Fecal occult blood test (FOBT) of stool. / Every year beginning at age 50 and continuing until age 75. You may not have to do this test if you get colonoscopy every 10 years.  Flexible sigmoidoscopy** or colonoscopy.** / Every 5 years for a flexible sigmoidoscopy or every 10 years for a colonoscopy beginning at age 50 and continuing until age 75.  Hepatitis C blood  test.** / For all people born from 1945 through 1965 and any individual with known risks for hepatitis C.  Abdominal aortic aneurysm (AAA) screening.** / A one-time screening for ages 65 to 75 years who are current or former smokers.  Skin self-exam. / Monthly.  Influenza immunization.** / Every year.  Pneumococcal polysaccharide immunization.** / 1 dose at age 65 (or older) if you have never been vaccinated.  Tetanus, diphtheria, pertussis (Tdap, Td) immunization. / A one-time dose of Tdap vaccine if you are over 65 and have contact with an infant, are a healthcare worker, or simply want to be protected from whooping cough. After that, you need a Td booster dose every 10 years.  Varicella immunization. ** / Consult your caregiver.  Meningococcal immunization.** / Consult your caregiver.  Hepatitis A immunization. ** / Consult your caregiver. 2 doses, 6 to 18 months apart.  Hepatitis B immunization.** / Check with your caregiver. 3 doses, usually over 6 months. **Family history and personal history of risk and conditions may change your caregiver's recommendations. Document Released: 09/10/2001 Document Revised: 10/07/2011 Document Reviewed: 12/10/2010 Exit[employer] Patient Information 2013 [employer].  

## 2012-07-24 NOTE — Progress Notes (Signed)
Patient ID: Taylor Mcdonald, male   DOB: 1956/02/06, 56 y.o.   MRN: [id number] Taylor Mcdonald [id number] 19-Sep-1955 07/24/2012      Progress Note New Patient  Subjective  Chief Complaint  Chief Complaint  Patient presents with  . Annual Exam    physical    HPI  Patient is a 56 year old Caucasian male who is in today for annual exam. Overall he feels well. He feels as if he is doing as well as he 10 years. His fatigue is improved with testosterone treatment and he does find the occasional dose of Ritalin helpful. He does not take it everyday and has not filled his December prescription for this reason. He denies any trouble with it. He's not complaining of any headaches, chest pain, palpitations, shortness of breath, GI or GU complaints. His insomnia is better and he uses his medications very frequently for this. He did have a recent injury when he was cutting down a tree he injured his back has been seeing Dr. Shelle Iron for this. The pain is in the low back and is improving. He takes an occasional hydrocodone with good results as needed chosen not to proceed with MRI for now and his symptoms are improving. No radicular symptoms or incontinence are noted. Allergies continue to bother him and he does not use his Flonase routinely but on occasions it is helpful. He uses Afrin intermittently when his symptoms are bad but says that is not something he uses on a daily basis  Past Medical History  Diagnosis Date  . Gout   . Hypertension   . Low testosterone   . Migraine   . Allergy   . RLS (restless legs syndrome)   . Insomnia   . Hyperlipidemia   . GERD (gastroesophageal reflux disease)   . Hyperglycemia   . Fatigue   . Pain of left heel 10/01/2011  . Vitamin D deficiency 12/23/2011  . Sinusitis 03/04/2012  . Anxiety 03/04/2012  . Preventative health care 07/24/2012    Past Surgical History  Procedure Date  . Tonsillectomy   . Panendoscopy     Family History  Problem Relation Age of  Onset  . Obesity Mother   . Hypertension Son   . Hypertension Maternal Grandmother   . Obesity Maternal Grandmother   . Cancer Maternal Grandmother 50    intestinal  . Dementia Maternal Grandfather     History   Social History  . Marital Status: Married    Spouse Name: N/A    Number of Children: N/A  . Years of Education: N/A   Occupational History  . Not on file.   Social History Main Topics  . Smoking status: Never Smoker   . Smokeless tobacco: Never Used  . Alcohol Use: No  . Drug Use: Yes    Special: Hydrocodone  . Sexually Active: Yes   Other Topics Concern  . Not on file   Social History Narrative  . No narrative on file    Current Outpatient Prescriptions on File Prior to Visit  Medication Sig Dispense Refill  . aspirin 81 MG tablet Take 81 mg by mouth daily.        . fexofenadine-pseudoephedrine (ALLEGRA-D) 60-120 MG per tablet Take 1 tablet by mouth 2 (two) times daily.  60 tablet  5  . fluticasone (FLONASE) 50 MCG/ACT nasal spray Place 2 sprays into the nose daily.  48 g  3  . HYDROcodone-acetaminophen (LORCET) 10-650 MG per tablet Take 1 tablet by mouth  every 4 (four) hours as needed. For pain  100 tablet  5  . HYDROcodone-homatropine (HYCODAN) 5-1.5 MG/5ML syrup Take 5 mLs by mouth every 4 (four) hours as needed.  120 mL  1  . indomethacin (INDOCIN) 50 MG capsule Take 1 tablet bid prn pain with food  60 capsule  1  . lisinopril (PRINIVIL,ZESTRIL) 20 MG tablet Take 1 tablet (20 mg total) by mouth daily.  90 tablet  3  . Omeprazole (KLS OMPERAZOLE) 20 MG TBEC Take 1 tablet (20 mg total) by mouth daily.  90 each  3  . propranolol (INDERAL) 40 MG tablet Take 1 tablet (40 mg total) by mouth daily.  90 tablet  3  . testosterone cypionate (DEPO-TESTOSTERONE) 200 MG/ML injection Inject 0.5 mLs (100 mg total) into the muscle every 14 (fourteen) days.  10 mL  0  . Calcium Carbonate-Vitamin D 600-200 MG-UNIT CAPS 1 tab daily    0  . niacin (NIASPAN) 500 MG CR tablet  Take 1 tablet (500 mg total) by mouth at bedtime. Take a lowfat snack an 81 mg aspirin 1/2 hour before      . promethazine (PHENERGAN) 25 MG suppository Place 1 suppository (25 mg total) rectally every 6 (six) hours as needed for nausea.  5 each  0    No Known Allergies  Review of Systems  Review of Systems  Constitutional: Negative for fever, chills and malaise/fatigue.  HENT: Positive for congestion. Negative for hearing loss and nosebleeds.   Eyes: Negative for discharge.  Respiratory: Negative for cough, sputum production, shortness of breath and wheezing.   Cardiovascular: Negative for chest pain, palpitations and leg swelling.  Gastrointestinal: Negative for heartburn, nausea, vomiting, abdominal pain, diarrhea, constipation and blood in stool.  Genitourinary: Negative for dysuria, urgency, frequency and hematuria.  Musculoskeletal: Negative for myalgias, back pain and falls.  Skin: Negative for rash.  Neurological: Negative for dizziness, tremors, sensory change, focal weakness, loss of consciousness, weakness and headaches.  Endo/Heme/Allergies: Negative for polydipsia. Does not bruise/bleed easily.  Psychiatric/Behavioral: Negative for depression and suicidal ideas. The patient is not nervous/anxious and does not have insomnia.     Objective  BP 128/84  Pulse 74  Temp 97.6 F (36.4 C) (Temporal)  Ht 6' 0.5" (1.842 m)  Wt 196 lb 12.8 oz (89.268 kg)  BMI 26.32 kg/m2  SpO2 100%  Physical Exam  Physical Exam  Constitutional: He is oriented to person, place, and time and well-developed, well-nourished, and in no distress. No distress.  HENT:  Head: Normocephalic and atraumatic.  Eyes: Conjunctivae normal are normal.  Neck: Neck supple. No thyromegaly present.  Cardiovascular: Normal rate, regular rhythm and normal heart sounds.   No murmur heard. Pulmonary/Chest: Effort normal and breath sounds normal. No respiratory distress.  Abdominal: He exhibits no distension and  no mass. There is no tenderness.  Musculoskeletal: He exhibits no edema.  Neurological: He is alert and oriented to person, place, and time.  Skin: Skin is warm.       Scaly flesh colored patches on arms and chest  Psychiatric: Memory, affect and judgment normal.       Assessment & Plan  Hypertension Well controlled on current meds. No changes.  Hyperlipidemia Doing well, Triglycerides up slightly, avoid simple carbs and increase exercise, start Niacin as directed  Testosterone deficiency Tolerating his shots. No changes, check level today  Vitamin D deficiency Well treated, no changes  Hyperglycemia Well controlled despite eating the day of labas  Fatigue Improved with  testosterone treatment, using Ritalin infrequently, shredded December rx and given a January prescription  Migraine Has not needed the Phenergan suppository yet  Allergic state Intermittent uses Afrin prn warned to use it sparingly  Preventative health care If he wants the Zostavax early he is to call his insurance company otherwise he is utd on preventative care. Encouraged heart healthy diet and exercise  Sun-damaged skin Agrees to see dermatology but has forgotten the name of the dermatologist he has seen in past he will go look it up and call us for referral

## 2012-07-27 ENCOUNTER — Telehealth: Payer: Self-pay

## 2012-07-27 NOTE — Telephone Encounter (Signed)
Patient left a message stating that he was waiting on his testosterone results to see if he was to continue on the testosterone. If he is supposed to continue he needs a new RX? Please advise?

## 2012-07-27 NOTE — Telephone Encounter (Signed)
Testosterone level was ok (actually a little low).  He is fine to continue his injections.  -thx

## 2012-07-28 NOTE — Telephone Encounter (Signed)
Pt notified and is agreeable.  Results released to mychart.

## 2012-08-13 ENCOUNTER — Other Ambulatory Visit: Payer: Self-pay | Admitting: Family Medicine

## 2012-08-13 DIAGNOSIS — E785 Hyperlipidemia, unspecified: Secondary | ICD-10-CM

## 2012-08-13 MED ORDER — NIACIN ER (ANTIHYPERLIPIDEMIC) 500 MG PO TBCR: 500.0000 mg | EXTENDED_RELEASE_TABLET | Freq: Every day | ORAL | Status: DC

## 2012-08-13 NOTE — Telephone Encounter (Signed)
Sent RX, usually this is an OTC medication

## 2012-09-04 ENCOUNTER — Telehealth: Payer: Self-pay | Admitting: Family Medicine

## 2012-09-04 NOTE — Telephone Encounter (Signed)
Pt refused nurse recommendations from [facility] and is requesting antibiotics.  Please advise.

## 2012-09-04 NOTE — Telephone Encounter (Signed)
Patient Information:  Caller Name: [facility]  Phone: [phone]  Patient: Taylor Mcdonald, Taylor Mcdonald  Gender: Male  DOB: 19-Mar-1956  Age: 57 Years  PCP: [facility])  Office Follow Up:  Does the office need to follow up with this patient?: Yes  Instructions For The Office: Has go to office now disposition due to redness of cheeks. No Appt in EPIC. Requesting a prescription be called in to [facility]   Symptoms  Reason For Call & Symptoms: Wing states he has "had the crud x 2 weeks and now has  sinus infection". States he has had productive cough x 2 weeks. Coughing up yellow sputum. Having redness to cheeks. Face flushed at times. Has go to office now disposition due to redness of cheek. No appts seen in Epic. Requesting prescription be called in to [facility]  Reviewed Health History In EMR: N/A  Reviewed Medications In EMR: N/A  Reviewed Allergies In EMR: Yes  Reviewed Surgeries / Procedures: Yes  Date of Onset of Symptoms: 08/21/2012  Treatments Tried: OTC cold meds , allegra d, saln nasal spray  Treatments Tried Worked: No  Any Fever: Yes  Fever Taken: Oral  Fever Time Of Reading: 18:00:00  Fever Last Reading: 99.0  Guideline(s) Used:  Sinus Pain and Congestion  Disposition Per Guideline:   Go to Office Now  Reason For Disposition Reached:   Redness or swelling on the cheek, forehead, or around the eye  Advice Given:  Reassurance:   Sinus congestion is a normal part of a cold.  Usually home treatment with nasal washes can prevent an actual bacterial sinus infection.  For a Stuffy Nose - Use Nasal Washes:  Methods: There are several ways to perform nasal irrigation. You can use a saline nasal spray bottle (available over-the-counter), a rubber ear syringe, a medical syringe without the needle, or a Neti Pot.  Call Back If:   Severe pain lasts longer than 2 hours after pain medicine  Sinus pain  lasts longer than 1 day after starting treatment using nasal washes  Sinus congestion (fullness) lasts longer than 10 days  Fever lasts longer than 3 days  You become worse.  Expected Course:  Sinus congestion from viral upper respiratory infections (colds) usually lasts 5-10 days.  Occasionally a cold can worsen and turn into bacterial sinusitis. Clues to this are sinus symptoms lasting longer than 10 days, fever lasting longer than 3 days, and worsening pain. Bacterial sinusitis may need antibiotic treatment.  Patient Refused Recommendation:  Patient Requests Prescription  Requesting antibiotics

## 2012-09-05 NOTE — Telephone Encounter (Signed)
If he is not feeling any better, he can have a Zpak

## 2012-09-07 MED ORDER — AZITHROMYCIN 250 MG PO TABS: ORAL_TABLET | ORAL | Status: DC

## 2012-09-07 NOTE — Telephone Encounter (Signed)
Pt states he doesn't feel any better. I informed pt we would send a Zpak to [facility] pharmacy. Pt voiced understanding

## 2012-09-09 ENCOUNTER — Ambulatory Visit (INDEPENDENT_AMBULATORY_CARE_PROVIDER_SITE_OTHER): Payer: BC Managed Care – PPO | Admitting: Family Medicine

## 2012-09-09 ENCOUNTER — Encounter: Payer: Self-pay | Admitting: Family Medicine

## 2012-09-09 VITALS — BP 112/82 | HR 71 | Ht 72.5 in | Wt 201.0 lb

## 2012-09-09 DIAGNOSIS — M545 Low back pain, unspecified: Secondary | ICD-10-CM

## 2012-09-09 DIAGNOSIS — IMO0002 Reserved for concepts with insufficient information to code with codable children: Secondary | ICD-10-CM

## 2012-09-09 DIAGNOSIS — M5410 Radiculopathy, site unspecified: Secondary | ICD-10-CM

## 2012-09-09 HISTORY — DX: Low back pain, unspecified: M54.50

## 2012-09-09 MED ORDER — PREDNISONE 20 MG PO TABS: ORAL_TABLET | ORAL | Status: DC

## 2012-09-09 NOTE — Progress Notes (Signed)
OFFICE NOTE  09/09/2012  CC:  Chief Complaint  Patient presents with  . Sciatica     HPI: Patient is a 57 y.o. Caucasian male who is here for acute pain. He had some trouble with acute low back pain over the last couple months, then yesterday he had acute onset of worsening pain in left low back and left buttocks region, occ extending down into prox hamstring region.  No tingling, numbness, or weakness.  No precipitating event yesterday but recalls working out harder than his usual the day prior.  Laying flat on back does help some. No pain meds today. Usually takes advil, took 1/2 of his lortab (that he usually takes for HA's) and it brought his pain down to 5/10 from 8/10.   Pt says it feels like an episode of sciatica that he has had in the distant past.    Pertinent PMH:  Past Medical History  Diagnosis Date  . Gout   . Hypertension   . Low testosterone   . Migraine   . Allergy   . RLS (restless legs syndrome)   . Insomnia   . Hyperlipidemia   . GERD (gastroesophageal reflux disease)   . Hyperglycemia   . Fatigue   . Pain of left heel 10/01/2011  . Vitamin D deficiency 12/23/2011  . Sinusitis 03/04/2012  . Anxiety 03/04/2012  . Preventative health care 07/24/2012    MEDS:  Outpatient Prescriptions Prior to Visit  Medication Sig Dispense Refill  . aspirin 81 MG tablet Take 81 mg by mouth daily.        . Calcium Carbonate-Vitamin D 600-200 MG-UNIT CAPS 1 tab daily    0  . cyclobenzaprine (FLEXERIL) 10 MG tablet Take 1 tablet (10 mg total) by mouth at bedtime as needed for muscle spasms.  30 tablet  0  . fexofenadine-pseudoephedrine (ALLEGRA-D) 60-120 MG per tablet Take 1 tablet by mouth 2 (two) times daily.  60 tablet  5  . fluticasone (FLONASE) 50 MCG/ACT nasal spray Place 2 sprays into the nose daily.  48 g  3  . HYDROcodone-acetaminophen (LORCET) 10-650 MG per tablet Take 1 tablet by mouth every 4 (four) hours as needed. For pain  100 tablet  5  . HYDROcodone-homatropine  (HYCODAN) 5-1.5 MG/5ML syrup Take 5 mLs by mouth every 4 (four) hours as needed.  120 mL  1  . indomethacin (INDOCIN) 50 MG capsule Take 1 tablet bid prn pain with food  60 capsule  1  . lisinopril (PRINIVIL,ZESTRIL) 20 MG tablet Take 1 tablet (20 mg total) by mouth daily.  90 tablet  3  . Lutein 10 MG TABS 1 tab daily  30 each    . methylphenidate (RITALIN) 10 MG tablet Take 1 tablet (10 mg total) by mouth 2 (two) times daily. Jan 2014 RX  60 tablet  0  . niacin (NIASPAN) 500 MG CR tablet Take 1 tablet (500 mg total) by mouth at bedtime. Take a lowfat snack an 81 mg aspirin 1/2 hour before  30 tablet  0  . Omeprazole (KLS OMPERAZOLE) 20 MG TBEC Take 1 tablet (20 mg total) by mouth daily.  90 each  3  . promethazine (PHENERGAN) 25 MG suppository Place 1 suppository (25 mg total) rectally every 6 (six) hours as needed for nausea.  5 each  0  . propranolol (INDERAL) 40 MG tablet Take 1 tablet (40 mg total) by mouth daily.  90 tablet  3  . testosterone cypionate (DEPO-TESTOSTERONE) 200 MG/ML injection  Inject 0.5 mLs (100 mg total) into the muscle every 14 (fourteen) days.  10 mL  0  . azithromycin (ZITHROMAX) 250 MG tablet Take 2 tabs once, then 1 tab daily X 4 days  6 each  0   No facility-administered medications prior to visit.    PE: Blood pressure 112/82, pulse 71, height 6' 0.5" (1.842 m), weight 201 lb (91.173 kg). Gen: Alert, well appearing.  Patient is oriented to person, place, time, and situation. Mild decreased ROM in L spine in all motions due to pain/stiffness.  Mild TTP in left lumbosacral junction. No tenderness in the area of the ischial tuberosity on either side.  Sitting SLR on right leg is positive at 45 degrees and elicits pain in LEFT sacral/glut region.  Sitting SLR on left leg is positive at about 75 degrees, again bringing pain in left sacral/glut region.  No tingling or numbness with SLR on either side.  Strength: prox and dist 5/5 bilat. DTRs 1+ in patellar and achilles  regions bilat.    IMPRESSION AND PLAN:  Acute low back pain with radicular symptoms, duration less than 6 weeks Lumbosacral musculoskeletal pain + possible DDD with acute HNP and nerve compression.  Also could be sciatic nerve inflammation only. Trial of prednisone 40mg  qd x 5d. He'll take full tab of his lortab that he already has--q6 h prn. May also take a flexeril q8h prn-that he already has. If not signif improved in 7-10d I emphasized the need for formal PT. No imaging at this time: pt reports normal plain x-ray at ortho office in the last few months.  No indication for MRI at this time.   An After Visit Summary was printed and given to the patient.  FOLLOW UP: prn

## 2012-09-09 NOTE — Assessment & Plan Note (Signed)
Lumbosacral musculoskeletal pain + possible DDD with acute HNP and nerve compression.  Also could be sciatic nerve inflammation only. Trial of prednisone 40mg  qd x 5d. He'll take full tab of his lortab that he already has--q6 h prn. May also take a flexeril q8h prn-that he already has. If not signif improved in 7-10d I emphasized the need for formal PT. No imaging at this time: pt reports normal plain x-ray at ortho office in the last few months.  No indication for MRI at this time.

## 2012-10-29 ENCOUNTER — Other Ambulatory Visit: Payer: Self-pay | Admitting: Family Medicine

## 2012-10-29 NOTE — Telephone Encounter (Signed)
Please advise refill. Last RX done on 04-16-12 quantity 10 with 1 refill.   If ok fax to [phone]

## 2013-01-06 ENCOUNTER — Other Ambulatory Visit: Payer: Self-pay | Admitting: Family Medicine

## 2013-02-10 ENCOUNTER — Other Ambulatory Visit (INDEPENDENT_AMBULATORY_CARE_PROVIDER_SITE_OTHER): Payer: BC Managed Care – PPO

## 2013-02-10 DIAGNOSIS — E291 Testicular hypofunction: Secondary | ICD-10-CM

## 2013-02-10 DIAGNOSIS — Z0000 Encounter for general adult medical examination without abnormal findings: Secondary | ICD-10-CM

## 2013-02-10 DIAGNOSIS — R7989 Other specified abnormal findings of blood chemistry: Secondary | ICD-10-CM

## 2013-02-10 LAB — HEPATIC FUNCTION PANEL
ALT: 20 U/L (ref 0–53)
AST: 19 U/L (ref 0–37)
Alkaline Phosphatase: 38 U/L — ABNORMAL LOW (ref 39–117)
Bilirubin, Direct: 0.1 mg/dL (ref 0.0–0.3)
Total Protein: 6.5 g/dL (ref 6.0–8.3)

## 2013-02-10 LAB — CBC
Hemoglobin: 15.1 g/dL (ref 13.0–17.0)
Platelets: 257 10*3/uL (ref 150.0–400.0)
RBC: 5.14 Mil/uL (ref 4.22–5.81)
WBC: 9.2 10*3/uL (ref 4.5–10.5)

## 2013-02-10 LAB — RENAL FUNCTION PANEL
Albumin: 4.2 g/dL (ref 3.5–5.2)
BUN: 22 mg/dL (ref 6–23)
Chloride: 105 mEq/L (ref 96–112)
Creatinine, Ser: 1.4 mg/dL (ref 0.4–1.5)
Phosphorus: 3.3 mg/dL (ref 2.3–4.6)

## 2013-02-10 LAB — LIPID PANEL
Total CHOL/HDL Ratio: 4
VLDL: 26 mg/dL (ref 0.0–40.0)

## 2013-02-10 LAB — TESTOSTERONE: Testosterone: 141.47 ng/dL — ABNORMAL LOW (ref 350.00–890.00)

## 2013-02-10 NOTE — Progress Notes (Signed)
Labs only

## 2013-02-17 ENCOUNTER — Ambulatory Visit (INDEPENDENT_AMBULATORY_CARE_PROVIDER_SITE_OTHER): Payer: BC Managed Care – PPO | Admitting: Family Medicine

## 2013-02-17 ENCOUNTER — Encounter: Payer: BC Managed Care – PPO | Admitting: Family Medicine

## 2013-02-17 ENCOUNTER — Encounter: Payer: Self-pay | Admitting: Family Medicine

## 2013-02-17 VITALS — BP 118/86 | HR 67 | Temp 98.0°F | Ht 72.5 in | Wt 193.0 lb

## 2013-02-17 DIAGNOSIS — I10 Essential (primary) hypertension: Secondary | ICD-10-CM

## 2013-02-17 DIAGNOSIS — Z0000 Encounter for general adult medical examination without abnormal findings: Secondary | ICD-10-CM

## 2013-02-17 DIAGNOSIS — G4700 Insomnia, unspecified: Secondary | ICD-10-CM

## 2013-02-17 DIAGNOSIS — Z5189 Encounter for other specified aftercare: Secondary | ICD-10-CM

## 2013-02-17 DIAGNOSIS — T7840XD Allergy, unspecified, subsequent encounter: Secondary | ICD-10-CM

## 2013-02-17 DIAGNOSIS — E349 Endocrine disorder, unspecified: Secondary | ICD-10-CM

## 2013-02-17 DIAGNOSIS — G43909 Migraine, unspecified, not intractable, without status migrainosus: Secondary | ICD-10-CM

## 2013-02-17 DIAGNOSIS — M109 Gout, unspecified: Secondary | ICD-10-CM

## 2013-02-17 DIAGNOSIS — K219 Gastro-esophageal reflux disease without esophagitis: Secondary | ICD-10-CM

## 2013-02-17 DIAGNOSIS — E785 Hyperlipidemia, unspecified: Secondary | ICD-10-CM

## 2013-02-17 DIAGNOSIS — M549 Dorsalgia, unspecified: Secondary | ICD-10-CM

## 2013-02-17 DIAGNOSIS — E291 Testicular hypofunction: Secondary | ICD-10-CM

## 2013-02-17 MED ORDER — FLUTICASONE PROPIONATE 50 MCG/ACT NA SUSP: 2.0000 | Freq: Every day | NASAL | Status: DC

## 2013-02-17 MED ORDER — TESTOSTERONE CYPIONATE 200 MG/ML IM SOLN: 100.0000 mg | INTRAMUSCULAR | Status: DC

## 2013-02-17 MED ORDER — METHYLPHENIDATE HCL 10 MG PO TABS: 10.0000 mg | ORAL_TABLET | Freq: Two times a day (BID) | ORAL | Status: DC

## 2013-02-17 MED ORDER — HYDROCODONE-HOMATROPINE 5-1.5 MG/5ML PO SYRP: 5.0000 mL | ORAL_SOLUTION | Freq: Every evening | ORAL | Status: DC | PRN

## 2013-02-17 MED ORDER — ZOLPIDEM TARTRATE 10 MG PO TABS: 10.0000 mg | ORAL_TABLET | Freq: Every evening | ORAL | Status: DC | PRN

## 2013-02-17 MED ORDER — PROPRANOLOL HCL 40 MG PO TABS: 40.0000 mg | ORAL_TABLET | Freq: Every day | ORAL | Status: DC

## 2013-02-17 MED ORDER — METHOCARBAMOL 500 MG PO TABS: 500.0000 mg | ORAL_TABLET | Freq: Two times a day (BID) | ORAL | Status: DC | PRN

## 2013-02-17 MED ORDER — ATORVASTATIN CALCIUM 10 MG PO TABS: 10.0000 mg | ORAL_TABLET | Freq: Every day | ORAL | Status: DC

## 2013-02-17 MED ORDER — SUMATRIPTAN SUCCINATE 50 MG PO TABS: 50.0000 mg | ORAL_TABLET | ORAL | Status: DC | PRN

## 2013-02-17 MED ORDER — ALLOPURINOL 100 MG PO TABS: 100.0000 mg | ORAL_TABLET | Freq: Every day | ORAL | Status: DC

## 2013-02-17 NOTE — Patient Instructions (Addendum)
Labs prior lipid, renal, cbc, hepatic, testosterone, uric acid   Back Pain, Adult Low back pain is very common. About 1 in 5 people have back pain.The cause of low back pain is rarely dangerous. The pain often gets better over time.About half of people with a sudden onset of back pain feel better in just 2 weeks. About 8 in 10 people feel better by 6 weeks.  CAUSES Some common causes of back pain include:  Strain of the muscles or ligaments supporting the spine.  Wear and tear (degeneration) of the spinal discs.  Arthritis.  Direct injury to the back. DIAGNOSIS Most of the time, the direct cause of low back pain is not known.However, back pain can be treated effectively even when the exact cause of the pain is unknown.Answering your caregiver's questions about your overall health and symptoms is one of the most accurate ways to make sure the cause of your pain is not dangerous. If your caregiver needs more information, he or she may order lab work or imaging tests (X-rays or MRIs).However, even if imaging tests show changes in your back, this usually does not require surgery. HOME CARE INSTRUCTIONS For many people, back pain returns.Since low back pain is rarely dangerous, it is often a condition that people can learn to [facility] their own.   Remain active. It is stressful on the back to sit or stand in one place. Do not sit, drive, or stand in one place for more than 30 minutes at a time. Take short walks on level surfaces as soon as pain allows.Try to increase the length of time you walk each day.  Do not stay in bed.Resting more than 1 or 2 days can delay your recovery.  Do not avoid exercise or work.Your body is made to move.It is not dangerous to be active, even though your back may hurt.Your back will likely heal faster if you return to being active before your pain is gone.  Pay attention to your body when you bend and lift. Many people have less discomfortwhen lifting  if they bend their knees, keep the load close to their bodies,and avoid twisting. Often, the most comfortable positions are those that put less stress on your recovering back.  Find a comfortable position to sleep. Use a firm mattress and lie on your side with your knees slightly bent. If you lie on your back, put a pillow under your knees.  Only take over-the-counter or prescription medicines as directed by your caregiver. Over-the-counter medicines to reduce pain and inflammation are often the most helpful.Your caregiver may prescribe muscle relaxant drugs.These medicines help dull your pain so you can more quickly return to your normal activities and healthy exercise.  Put ice on the injured area.  Put ice in a plastic bag.  Place a towel between your skin and the bag.  Leave the ice on for 15-20 minutes, 3-4 times a day for the first 2 to 3 days. After that, ice and heat may be alternated to reduce pain and spasms.  Ask your caregiver about trying back exercises and gentle massage. This may be of some benefit.  Avoid feeling anxious or stressed.Stress increases muscle tension and can worsen back pain.It is important to recognize when you are anxious or stressed and learn ways to manage it.Exercise is a great option. SEEK MEDICAL CARE IF:  You have pain that is not relieved with rest or medicine.  You have pain that does not improve in 1 week.  You  have new symptoms.  You are generally not feeling well. SEEK IMMEDIATE MEDICAL CARE IF:   You have pain that radiates from your back into your legs.  You develop new bowel or bladder control problems.  You have unusual weakness or numbness in your arms or legs.  You develop nausea or vomiting.  You develop abdominal pain.  You feel faint. Document Released: 07/15/2005 Document Revised: 01/14/2012 Document Reviewed: 12/03/2010 [facility] Patient Information 2014 [place], [place].

## 2013-02-18 LAB — URIC ACID: Uric Acid, Serum: 7.1 mg/dL (ref 4.0–7.8)

## 2013-02-19 ENCOUNTER — Telehealth: Payer: Self-pay

## 2013-02-19 NOTE — Telephone Encounter (Signed)
Prior Authorization per Jasmine December for Testosterone has been sent.  Waiting for an answer  Charmayne Sheer at [facility] notified

## 2013-02-21 NOTE — Progress Notes (Signed)
Patient ID: Taylor Mcdonald, male   DOB: 04-Apr-1956, 57 y.o.   MRN: [id number] Taylor Mcdonald [id number] 1956-02-13 02/21/2013      Progress Note-Follow Up  Subjective  Chief Complaint  Chief Complaint  Patient presents with  . Annual Exam    physical    HPI  Patient is a 57 year old Caucasian male in today for annual exam. Complaining of some increase in neck pain worse since he fell off a ladder a few months ago. It comes and goes in spasms every few weeks. We pain has occasional migraine still no visual changes but does have nausea vomiting occurs they occur about 4 times a year. And recently retired. Has had some gouty flares in his left wrist and his right great toe improved at this time. No recent illness. No chest pain, palpitations, shortness of breath, GI or GU concerns.  Past Medical History  Diagnosis Date  . Gout   . Hypertension   . Low testosterone   . Migraine   . Allergy   . RLS (restless legs syndrome)   . Insomnia   . Hyperlipidemia   . GERD (gastroesophageal reflux disease)   . Hyperglycemia   . Fatigue   . Pain of left heel 10/01/2011  . Vitamin D deficiency 12/23/2011  . Sinusitis 03/04/2012  . Anxiety 03/04/2012  . Preventative health care 07/24/2012    Past Surgical History  Procedure Laterality Date  . Tonsillectomy    . Panendoscopy      Family History  Problem Relation Age of Onset  . Obesity Mother   . Hypertension Son   . Hypertension Maternal Grandmother   . Obesity Maternal Grandmother   . Cancer Maternal Grandmother 50    intestinal  . Dementia Maternal Grandfather     History   Social History  . Marital Status: Married    Spouse Name: N/A    Number of Children: N/A  . Years of Education: N/A   Occupational History  . Not on file.   Social History Main Topics  . Smoking status: Never Smoker   . Smokeless tobacco: Never Used  . Alcohol Use: No  . Drug Use: Yes    Special: Hydrocodone  . Sexually Active: Yes   Other  Topics Concern  . Not on file   Social History Narrative  . No narrative on file    Current Outpatient Prescriptions on File Prior to Visit  Medication Sig Dispense Refill  . aspirin 81 MG tablet Take 81 mg by mouth daily.        . Calcium Carbonate-Vitamin D 600-200 MG-UNIT CAPS 1 tab daily    0  . fexofenadine-pseudoephedrine (ALLEGRA-D) 60-120 MG per tablet Take 1 tablet by mouth 2 (two) times daily.  60 tablet  5  . HYDROcodone-acetaminophen (LORCET) 10-650 MG per tablet Take 1 tablet by mouth every 4 (four) hours as needed. For pain  100 tablet  5  . lisinopril (PRINIVIL,ZESTRIL) 20 MG tablet TAKE 1 TABLET (20 MG TOTAL) BY MOUTH DAILY.  90 tablet  3  . Lutein 10 MG TABS 1 tab daily  30 each    . niacin (NIASPAN) 500 MG CR tablet Take 1 tablet (500 mg total) by mouth at bedtime. Take a lowfat snack an 81 mg aspirin 1/2 hour before  30 tablet  0  . Omeprazole (KLS OMPERAZOLE) 20 MG TBEC Take 1 tablet (20 mg total) by mouth daily.  90 each  3  . promethazine (PHENERGAN) 25 MG  suppository Place 1 suppository (25 mg total) rectally every 6 (six) hours as needed for nausea.  5 each  0   No current facility-administered medications on file prior to visit.    No Known Allergies  Review of Systems  Review of Systems  Constitutional: Negative for fever and malaise/fatigue.  HENT: Positive for neck pain. Negative for congestion.   Eyes: Negative for pain and discharge.  Respiratory: Negative for shortness of breath.   Cardiovascular: Negative for chest pain, palpitations and leg swelling.  Gastrointestinal: Negative for nausea, abdominal pain and diarrhea.  Genitourinary: Negative for dysuria.  Musculoskeletal: Positive for joint pain. Negative for falls.       Left wrist, right foot  Skin: Negative for rash.  Neurological: Negative for loss of consciousness and headaches.  Endo/Heme/Allergies: Negative for polydipsia.  Psychiatric/Behavioral: Negative for depression and suicidal  ideas. The patient is not nervous/anxious and does not have insomnia.     Objective  BP 118/86  Pulse 67  Temp(Src) 98 F (36.7 C) (Oral)  Ht 6' 0.5" (1.842 m)  Wt 193 lb (87.544 kg)  BMI 25.8 kg/m2  SpO2 98%  Physical Exam  Physical Exam  Constitutional: He is oriented to person, place, and time and well-developed, well-nourished, and in no distress. No distress.  HENT:  Head: Normocephalic and atraumatic.  Eyes: Conjunctivae are normal.  Neck: Neck supple. No thyromegaly present.  Cardiovascular: Normal rate, regular rhythm and normal heart sounds.   No murmur heard. Pulmonary/Chest: Effort normal and breath sounds normal. No respiratory distress.  Abdominal: He exhibits no distension and no mass. There is no tenderness.  Musculoskeletal: He exhibits no edema.  Neurological: He is alert and oriented to person, place, and time.  Skin: Skin is warm.  Psychiatric: Memory, affect and judgment normal.    Lab Results  Component Value Date   TSH 0.56 02/10/2013   Lab Results  Component Value Date   WBC 9.2 02/10/2013   HGB 15.1 02/10/2013   HCT 45.0 02/10/2013   MCV 87.7 02/10/2013   PLT 257.0 02/10/2013   Lab Results  Component Value Date   CREATININE 1.4 02/10/2013   BUN 22 02/10/2013   NA 136 02/10/2013   K 4.7 02/10/2013   CL 105 02/10/2013   CO2 25 02/10/2013   Lab Results  Component Value Date   ALT 20 02/10/2013   AST 19 02/10/2013   ALKPHOS 38* 02/10/2013   BILITOT 0.6 02/10/2013   Lab Results  Component Value Date   CHOL 201* 02/10/2013   Lab Results  Component Value Date   HDL 49.50 02/10/2013   Lab Results  Component Value Date   LDLCALC 103* 12/13/2011   Lab Results  Component Value Date   TRIG 130.0 02/10/2013   Lab Results  Component Value Date   CHOLHDL 4 02/10/2013     Assessment & Plan  GERD (gastroesophageal reflux disease) Well controlled most days, avoid offending foods, continue Omeprazole prn  Migraine Roughly 4 a year. Responds to  Hydrocodone prn. Encouraged good sleep, regular exercise ane plenty of fluids.  Hyperlipidemia toleratiing Lovastatin, avoid trans fats.  Testosterone deficiency Tolerating treatments. No changes  Insomnia Good response to meds, no changes  Preventative health care Avoid trans fats, exercise regularly, get regular sleep, encouraged heart healthy diet.   Hypertension Well controlled, no changes.   Gout Uric acid high normal but very symptomatic started on Allopurinol

## 2013-02-21 NOTE — Assessment & Plan Note (Signed)
Roughly 4 a year. Responds to Hydrocodone prn. Encouraged good sleep, regular exercise ane plenty of fluids.

## 2013-02-21 NOTE — Assessment & Plan Note (Signed)
Well controlled most days, avoid offending foods, continue Omeprazole prn

## 2013-02-21 NOTE — Assessment & Plan Note (Signed)
Avoid trans fats, exercise regularly, get regular sleep, encouraged heart healthy diet.

## 2013-02-21 NOTE — Assessment & Plan Note (Signed)
toleratiing Lovastatin, avoid trans fats.

## 2013-02-21 NOTE — Assessment & Plan Note (Signed)
Good response to meds, no changes 

## 2013-02-21 NOTE — Assessment & Plan Note (Signed)
Uric acid high normal but very symptomatic started on Allopurinol

## 2013-02-21 NOTE — Assessment & Plan Note (Signed)
Tolerating treatments. No changes

## 2013-02-21 NOTE — Assessment & Plan Note (Signed)
Well controlled, no changes 

## 2013-03-03 NOTE — Telephone Encounter (Signed)
Resend on PA form for pt's Testosterone approval [first fax 07.23.14]; received fax back from [facility] asking "do not resubmit the completed form as duplicate submissions will delay process times. All PA request received via fax are rsponded to via notification within 5 business days"; sent fax back to [facility] with attached note stating that first faxed request was sent on February 17, 2013 without response/SLS

## 2013-03-05 NOTE — Telephone Encounter (Signed)
Spoke with Taylor Mcdonald at [facility] & explained PA situation and faxed her copy of PA form [faxed twice] & note regarding request not responded to/SLS

## 2013-03-05 NOTE — Telephone Encounter (Signed)
New PA form received from [facility] for Testosterone approval, completed & faxed for approval to [phone] w/Urgent request written on form/SLS

## 2013-03-05 NOTE — Telephone Encounter (Signed)
Keyana from [facility] left a message stating she was wandering what was going on with pts PA.  Can you please call and inform her?

## 2013-03-10 NOTE — Telephone Encounter (Signed)
PA Approval faxed & informed pharmacy/SLS

## 2013-03-22 ENCOUNTER — Telehealth: Payer: Self-pay | Admitting: Family Medicine

## 2013-03-22 MED ORDER — METHYLPREDNISOLONE (PAK) 4 MG PO TABS: ORAL_TABLET | ORAL | Status: DC

## 2013-03-22 NOTE — Telephone Encounter (Signed)
OK to call in a medrol dosepak to the pharmacy as long as he is not having any incontinence of bladder or bowels. If no improvement then needs to come in

## 2013-03-22 NOTE — Telephone Encounter (Signed)
Patient informed and RX sent to pharmacy 

## 2013-03-22 NOTE — Telephone Encounter (Signed)
HAS PAIN IN LOWER BACK AND DOWN LEFT LEG A LITTLE.  HE HAD THIS PREVIOUSLY AND YOU GAVE HIM PREDNISONE WHICH RESOLVED THE PROBLEM.  HE WOULD LIKE A STEP DOWN CALLED IN TO  [facility]

## 2013-03-30 ENCOUNTER — Other Ambulatory Visit: Payer: Self-pay | Admitting: Family Medicine

## 2013-03-30 ENCOUNTER — Telehealth: Payer: Self-pay | Admitting: Family Medicine

## 2013-03-30 MED ORDER — METHYLPREDNISOLONE (PAK) 4 MG PO TABS: ORAL_TABLET | ORAL | Status: DC

## 2013-03-30 NOTE — Telephone Encounter (Signed)
Patient states that he had finished the round of prednisone on Saturday and he felt fine yesterday but says that he the pain is back. He would like to know if Dr. Abner Greenspan would call him in another round of prednisone? [place] and would like prescription to go to [facility] on [address]. Phone # [phone]

## 2013-03-30 NOTE — Telephone Encounter (Signed)
FYI:    Patient called giving his daughter permission to s/w RN. Onset 03/28/13 Medrol dosepak finished. Since 03-27-13 with worsening back pain. Pt comes on phone. pain in lower back w/radiation into left leg w/walking or standing. Less painful w/sitting. Afebrile. Alert and has not had any trauma or participated in any strenuous activity. All emergent sxs r/o per back symptoms protocol w/the exception of "back pain radiated into thigh or below knee". RN instructed evaluation in 24 hours at [facility] due to office being closed for Labor Day. Caller refuses, was wanting RX called in and since cannot be called in states he will call office on Tues 03-30-13 when they open

## 2013-03-30 NOTE — Telephone Encounter (Signed)
Can have one repeat round of Prednisone but if no improvement needs to come in for evaluation

## 2013-03-30 NOTE — Telephone Encounter (Signed)
Pt informed and voiced understanding.  RX sent

## 2013-03-30 NOTE — Telephone Encounter (Signed)
Please advise 

## 2013-04-09 ENCOUNTER — Emergency Department (HOSPITAL_BASED_OUTPATIENT_CLINIC_OR_DEPARTMENT_OTHER): Payer: BC Managed Care – PPO

## 2013-04-09 ENCOUNTER — Encounter (HOSPITAL_BASED_OUTPATIENT_CLINIC_OR_DEPARTMENT_OTHER): Payer: Self-pay

## 2013-04-09 ENCOUNTER — Emergency Department (HOSPITAL_BASED_OUTPATIENT_CLINIC_OR_DEPARTMENT_OTHER)
Admission: EM | Admit: 2013-04-09 | Discharge: 2013-04-09 | Disposition: A | Discharge status: Home / Self Care | Payer: BC Managed Care – PPO | Attending: Emergency Medicine | Admitting: Emergency Medicine

## 2013-04-09 DIAGNOSIS — I10 Essential (primary) hypertension: Secondary | ICD-10-CM | POA: Insufficient documentation

## 2013-04-09 DIAGNOSIS — M109 Gout, unspecified: Secondary | ICD-10-CM | POA: Insufficient documentation

## 2013-04-09 DIAGNOSIS — Z8669 Personal history of other diseases of the nervous system and sense organs: Secondary | ICD-10-CM | POA: Insufficient documentation

## 2013-04-09 DIAGNOSIS — E785 Hyperlipidemia, unspecified: Secondary | ICD-10-CM | POA: Insufficient documentation

## 2013-04-09 DIAGNOSIS — IMO0002 Reserved for concepts with insufficient information to code with codable children: Secondary | ICD-10-CM | POA: Insufficient documentation

## 2013-04-09 DIAGNOSIS — R55 Syncope and collapse: Secondary | ICD-10-CM | POA: Insufficient documentation

## 2013-04-09 DIAGNOSIS — R61 Generalized hyperhidrosis: Secondary | ICD-10-CM | POA: Insufficient documentation

## 2013-04-09 DIAGNOSIS — R110 Nausea: Secondary | ICD-10-CM | POA: Insufficient documentation

## 2013-04-09 DIAGNOSIS — Z7982 Long term (current) use of aspirin: Secondary | ICD-10-CM | POA: Insufficient documentation

## 2013-04-09 DIAGNOSIS — Z8639 Personal history of other endocrine, nutritional and metabolic disease: Secondary | ICD-10-CM | POA: Insufficient documentation

## 2013-04-09 DIAGNOSIS — Z862 Personal history of diseases of the blood and blood-forming organs and certain disorders involving the immune mechanism: Secondary | ICD-10-CM | POA: Insufficient documentation

## 2013-04-09 DIAGNOSIS — G43909 Migraine, unspecified, not intractable, without status migrainosus: Secondary | ICD-10-CM | POA: Insufficient documentation

## 2013-04-09 DIAGNOSIS — Z79899 Other long term (current) drug therapy: Secondary | ICD-10-CM | POA: Insufficient documentation

## 2013-04-09 DIAGNOSIS — R5381 Other malaise: Secondary | ICD-10-CM | POA: Insufficient documentation

## 2013-04-09 DIAGNOSIS — Z8659 Personal history of other mental and behavioral disorders: Secondary | ICD-10-CM | POA: Insufficient documentation

## 2013-04-09 DIAGNOSIS — K219 Gastro-esophageal reflux disease without esophagitis: Secondary | ICD-10-CM | POA: Insufficient documentation

## 2013-04-09 LAB — CBC WITH DIFFERENTIAL/PLATELET
Basophils Absolute: 0 10*3/uL (ref 0.0–0.1)
Eosinophils Relative: 1 % (ref 0–5)
HCT: 43 % (ref 39.0–52.0)
Hemoglobin: 15 g/dL (ref 13.0–17.0)
Lymphocytes Relative: 17 % (ref 12–46)
MCV: 85.7 fL (ref 78.0–100.0)
Monocytes Absolute: 1 10*3/uL (ref 0.1–1.0)
Monocytes Relative: 9 % (ref 3–12)
RDW: 13.6 % (ref 11.5–15.5)
WBC: 11.4 10*3/uL — ABNORMAL HIGH (ref 4.0–10.5)

## 2013-04-09 LAB — BASIC METABOLIC PANEL
BUN: 22 mg/dL (ref 6–23)
CO2: 26 mEq/L (ref 19–32)
Calcium: 9.8 mg/dL (ref 8.4–10.5)
Creatinine, Ser: 1.2 mg/dL (ref 0.50–1.35)
Glucose, Bld: 159 mg/dL — ABNORMAL HIGH (ref 70–99)

## 2013-04-09 LAB — TROPONIN I: Troponin I: <0.3 ng/mL (ref <0.30)

## 2013-04-09 NOTE — ED Provider Notes (Signed)
CSN: [id number]     Arrival date & time 04/09/13  1217 History   First MD Initiated Contact with Patient 04/09/13 1307     Chief Complaint  Patient presents with  . Numbness  . Nausea   (Consider location/radiation/quality/duration/timing/severity/associated sxs/prior Treatment) Patient is a 57 y.o. male presenting with weakness. The history is provided by the patient. No language interpreter was used.  Weakness This is a new problem. The current episode started today. The problem has been resolved. Associated symptoms include diaphoresis, nausea, numbness and weakness. Pertinent negatives include no abdominal pain. Nothing aggravates the symptoms. He has tried nothing for the symptoms. The treatment provided mild relief.   Pt complains of an episode of feeling nauseated, becoming pale and getting sweaty.  Pt reports no abdominal pain, no chest pain.   Pt reports symptoms resolved with aspirin and ibuprofen.  Pt reports he took gout medicine yesterday no other recent medications  Past Medical History  Diagnosis Date  . Gout   . Hypertension   . Low testosterone   . Migraine   . Allergy   . RLS (restless legs syndrome)   . Insomnia   . Hyperlipidemia   . GERD (gastroesophageal reflux disease)   . Hyperglycemia   . Fatigue   . Pain of left heel 10/01/2011  . Vitamin D deficiency 12/23/2011  . Sinusitis 03/04/2012  . Anxiety 03/04/2012  . Preventative health care 07/24/2012   Past Surgical History  Procedure Laterality Date  . Tonsillectomy    . Panendoscopy     Family History  Problem Relation Age of Onset  . Obesity Mother   . Hypertension Son   . Hypertension Maternal Grandmother   . Obesity Maternal Grandmother   . Cancer Maternal Grandmother 50    intestinal  . Dementia Maternal Grandfather    History  Substance Use Topics  . Smoking status: Never Smoker   . Smokeless tobacco: Never Used  . Alcohol Use: No    Review of Systems  Constitutional: Positive for  diaphoresis.  Gastrointestinal: Positive for nausea. Negative for abdominal pain.  Neurological: Positive for weakness and numbness.  All other systems reviewed and are negative.    Allergies  Review of patient's allergies indicates no known allergies.  Home Medications   Current Outpatient Rx  Name  Route  Sig  Dispense  Refill  . allopurinol (ZYLOPRIM) 100 MG tablet   Oral   Take 1 tablet (100 mg total) by mouth daily.   30 tablet   6   . aspirin 81 MG tablet   Oral   Take 81 mg by mouth daily.           Marland Kitchen atorvastatin (LIPITOR) 10 MG tablet   Oral   Take 1 tablet (10 mg total) by mouth daily.   30 tablet   5   . Calcium Carbonate-Vitamin D 600-200 MG-UNIT CAPS      1 tab daily      0   . fexofenadine-pseudoephedrine (ALLEGRA-D) 60-120 MG per tablet   Oral   Take 1 tablet by mouth 2 (two) times daily.   60 tablet   5   . fluticasone (FLONASE) 50 MCG/ACT nasal spray   Nasal   Place 2 sprays into the nose daily.   48 g   3   . HYDROcodone-acetaminophen (LORCET) 10-650 MG per tablet   Oral   Take 1 tablet by mouth every 4 (four) hours as needed. For pain   100 tablet  5   . HYDROcodone-homatropine (HYDROMET) 5-1.5 MG/5ML syrup   Oral   Take 5 mLs by mouth at bedtime as needed for cough or pain.   140 mL   0   . lisinopril (PRINIVIL,ZESTRIL) 20 MG tablet      TAKE 1 TABLET (20 MG TOTAL) BY MOUTH DAILY.   90 tablet   3   . Lutein 10 MG TABS      1 tab daily   30 each      . methocarbamol (ROBAXIN) 500 MG tablet   Oral   Take 1 tablet (500 mg total) by mouth 2 (two) times daily as needed.   30 tablet   1   . methylphenidate (RITALIN) 10 MG tablet   Oral   Take 1 tablet (10 mg total) by mouth 2 (two) times daily. July 2014 RX   60 tablet   0   . methylPREDNIsolone (MEDROL DOSPACK) 4 MG tablet      follow package directions   21 tablet   0   . niacin (NIASPAN) 500 MG CR tablet   Oral   Take 1 tablet (500 mg total) by mouth at  bedtime. Take a lowfat snack an 81 mg aspirin 1/2 hour before   30 tablet   0   . Omeprazole (KLS OMPERAZOLE) 20 MG TBEC   Oral   Take 1 tablet (20 mg total) by mouth daily.   90 each   3   . promethazine (PHENERGAN) 25 MG suppository   Rectal   Place 1 suppository (25 mg total) rectally every 6 (six) hours as needed for nausea.   5 each   0   . propranolol (INDERAL) 40 MG tablet   Oral   Take 1 tablet (40 mg total) by mouth daily.   90 tablet   3   . SUMAtriptan (IMITREX) 50 MG tablet   Oral   Take 1 tablet (50 mg total) by mouth every 2 (two) hours as needed for migraine.   10 tablet   1   . testosterone cypionate (DEPO-TESTOSTERONE) 200 MG/ML injection   Intramuscular   Inject 0.5 mLs (100 mg total) into the muscle every 14 (fourteen) days.   10 mL   0    BP 129/88  Pulse 58  Temp(Src) 98 F (36.7 C) (Oral)  Resp 16  Ht 6\' 2"  (1.88 m)  Wt 195 lb (88.451 kg)  BMI 25.03 kg/m2  SpO2 100% Physical Exam  Nursing note and vitals reviewed. Constitutional: He is oriented to person, place, and time. He appears well-developed and well-nourished.  HENT:  Head: Normocephalic and atraumatic.  Eyes: Conjunctivae are normal. Pupils are equal, round, and reactive to light.  Neck: Normal range of motion. Neck supple.  Cardiovascular: Normal rate and normal heart sounds.   Pulmonary/Chest: Effort normal.  Abdominal: Soft.  Musculoskeletal: Normal range of motion.  Neurological: He is alert and oriented to person, place, and time. He has normal reflexes.  Skin: Skin is warm.  Psychiatric: He has a normal mood and affect.    ED Course  Procedures (including critical care time) Labs Review Labs Reviewed  CBC WITH DIFFERENTIAL  BASIC METABOLIC PANEL   Imaging Review No results found.  Date: 04/09/2013  Rate: 57  Rhythm: sinus bradycardia  QRS Axis: normal  Intervals: normal  ST/T Wave abnormalities: normal  Conduction Disutrbances:none  Narrative  Interpretation:   Old EKG Reviewed: none available  MDM   1. Vasovagal near syncope  Pt advised to follow up with primary for recheck.   Return if any problems  Elson Areas, [place] 04/09/13 2237

## 2013-04-09 NOTE — ED Provider Notes (Signed)
Medical screening examination/treatment/procedure(s) were conducted as a shared visit with non-physician practitioner(s) and myself.  I personally evaluated the patient during the encounter.   Please see my separate note.     Candyce Churn, MD 04/09/13 [phone]

## 2013-04-09 NOTE — ED Provider Notes (Signed)
Medical screening examination/treatment/procedure(s) were conducted as a shared visit with non-physician practitioner(s) and myself.  I personally evaluated the patient during the encounter  3:44 PM 57 yo male with episode of nausea and diaphoresis.  Lasted about 15 minutes.  No chest pain or shortness of breath.  In setting of increased sciatic pain.  Exam shows normal heart sounds, RRR, no m/r/g.  Lungs CTAB.  Normal upper extremity strength and sensation.  Story is atypical for ACS.  More likely a vasovagal reaction.  However, plan to check delta troponin and have him follow up closely.  No symptoms currently.    Clinical Impression: 1. Vasovagal near syncope       Candyce Churn, MD 04/09/13 [phone]

## 2013-04-09 NOTE — ED Notes (Signed)
Pt reports numbness in right upper arm that started yesterday.  Numbness continues, nausea and diaphoresis today.  Took 4 baby aspirin PTA

## 2013-04-13 ENCOUNTER — Other Ambulatory Visit: Payer: Self-pay | Admitting: Family Medicine

## 2013-04-13 ENCOUNTER — Other Ambulatory Visit: Payer: Self-pay | Admitting: *Deleted

## 2013-04-13 NOTE — Telephone Encounter (Signed)
rx already filled.

## 2013-04-26 ENCOUNTER — Telehealth: Payer: Self-pay | Admitting: Family Medicine

## 2013-04-26 NOTE — Telephone Encounter (Signed)
Patient informed that he needs to be seen. Appt scheduled for 04-28-13

## 2013-04-26 NOTE — Telephone Encounter (Signed)
Patient states that he dropped off a surgical clearance form last Friday and wanted to make sure that the form was faxed to the # on the paper. He would like a callback when this is done. Thanks!

## 2013-04-28 ENCOUNTER — Encounter: Payer: Self-pay | Admitting: Family Medicine

## 2013-04-28 ENCOUNTER — Ambulatory Visit (INDEPENDENT_AMBULATORY_CARE_PROVIDER_SITE_OTHER): Payer: BC Managed Care – PPO | Admitting: Family Medicine

## 2013-04-28 ENCOUNTER — Other Ambulatory Visit: Payer: Self-pay | Admitting: Family Medicine

## 2013-04-28 VITALS — BP 132/86 | HR 63 | Temp 97.9°F | Ht 72.5 in | Wt 190.1 lb

## 2013-04-28 DIAGNOSIS — R739 Hyperglycemia, unspecified: Secondary | ICD-10-CM

## 2013-04-28 DIAGNOSIS — K219 Gastro-esophageal reflux disease without esophagitis: Secondary | ICD-10-CM

## 2013-04-28 DIAGNOSIS — R7309 Other abnormal glucose: Secondary | ICD-10-CM

## 2013-04-28 DIAGNOSIS — M109 Gout, unspecified: Secondary | ICD-10-CM

## 2013-04-28 DIAGNOSIS — I10 Essential (primary) hypertension: Secondary | ICD-10-CM

## 2013-04-28 DIAGNOSIS — R55 Syncope and collapse: Secondary | ICD-10-CM

## 2013-04-28 DIAGNOSIS — R7989 Other specified abnormal findings of blood chemistry: Secondary | ICD-10-CM

## 2013-04-28 DIAGNOSIS — E349 Endocrine disorder, unspecified: Secondary | ICD-10-CM

## 2013-04-28 DIAGNOSIS — M545 Low back pain: Secondary | ICD-10-CM

## 2013-04-28 DIAGNOSIS — E291 Testicular hypofunction: Secondary | ICD-10-CM

## 2013-04-28 LAB — CBC
HCT: 43.3 % (ref 39.0–52.0)
Hemoglobin: 14.8 g/dL (ref 13.0–17.0)
MCH: 29.3 pg (ref 26.0–34.0)
MCHC: 34.2 g/dL (ref 30.0–36.0)
MCV: 85.7 fL (ref 78.0–100.0)
RBC: 5.05 MIL/uL (ref 4.22–5.81)

## 2013-04-28 LAB — RENAL FUNCTION PANEL
Albumin: 4.4 g/dL (ref 3.5–5.2)
BUN: 19 mg/dL (ref 6–23)
CO2: 28 mEq/L (ref 19–32)
Calcium: 10.2 mg/dL (ref 8.4–10.5)
Creat: 1.1 mg/dL (ref 0.50–1.35)
Glucose, Bld: 102 mg/dL — ABNORMAL HIGH (ref 70–99)

## 2013-04-28 LAB — TSH: TSH: 0.315 u[IU]/mL — ABNORMAL LOW (ref 0.350–4.500)

## 2013-04-28 LAB — HEPATIC FUNCTION PANEL
ALT: 21 U/L (ref 0–53)
AST: 18 U/L (ref 0–37)
Total Protein: 6.7 g/dL (ref 6.0–8.3)

## 2013-04-28 NOTE — Assessment & Plan Note (Signed)
Recheck level today, continue shots Taylor Mcdonald

## 2013-04-28 NOTE — Assessment & Plan Note (Addendum)
Has struggled with worsening pain over past few months and is ready to proceed with surgical correction. Pain has been unrelieved with treatments such as Prednisone which had previously been helpful. Is medically cleared today and will discuss any further concerns with surgeon prior to surgery. Will hold all NSAIDs, fish oil the week prior to procedure.

## 2013-04-28 NOTE — Patient Instructions (Addendum)
Hold fish oil, sudafed, Advil the week prior to surgery  Gout Gout is an inflammatory condition (arthritis) caused by a buildup of uric acid crystals in the joints. Uric acid is a chemical that is normally present in the blood. Under some circumstances, uric acid can form into crystals in your joints. This causes joint redness, soreness, and swelling (inflammation). Repeat attacks are common. Over time, uric acid crystals can form into masses (tophi) near a joint, causing disfigurement. Gout is treatable and often preventable. CAUSES  The disease begins with elevated levels of uric acid in the blood. Uric acid is produced by your body when it breaks down a naturally found substance called purines. This also happens when you eat certain foods such as meats and fish. Causes of an elevated uric acid level include:  Being passed down from parent to child (heredity).  Diseases that cause increased uric acid production (obesity, psoriasis, some cancers).  Excessive alcohol use.  Diet, especially diets rich in meat and seafood.  Medicines, including certain cancer-fighting drugs (chemotherapy), diuretics, and aspirin.  Chronic kidney disease. The kidneys are no longer able to remove uric acid well.  Problems with metabolism. Conditions strongly associated with gout include:  Obesity.  High blood pressure.  High cholesterol.  Diabetes. Not everyone with elevated uric acid levels gets gout. It is not understood why some people get gout and others do not. Surgery, joint injury, and eating too much of certain foods are some of the factors that can lead to gout. SYMPTOMS   An attack of gout comes on quickly. It causes intense pain with redness, swelling, and warmth in a joint.  Fever can occur.  Often, only one joint is involved. Certain joints are more commonly involved:  Base of the big toe.  Knee.  Ankle.  Wrist.  Finger. Without treatment, an attack usually goes away in a few  days to weeks. Between attacks, you usually will not have symptoms, which is different from many other forms of arthritis. DIAGNOSIS  Your caregiver will suspect gout based on your symptoms and exam. Removal of fluid from the joint (arthrocentesis) is done to check for uric acid crystals. Your caregiver will give you a medicine that numbs the area (local anesthetic) and use a needle to remove joint fluid for exam. Gout is confirmed when uric acid crystals are seen in joint fluid, using a special microscope. Sometimes, blood, urine, and X-ray tests are also used. TREATMENT  There are 2 phases to gout treatment: treating the sudden onset (acute) attack and preventing attacks (prophylaxis). Treatment of an Acute Attack  Medicines are used. These include anti-inflammatory medicines or steroid medicines.  An injection of steroid medicine into the affected joint is sometimes necessary.  The painful joint is rested. Movement can worsen the arthritis.  You may use warm or cold treatments on painful joints, depending which works best for you.  Discuss the use of coffee, vitamin C, or cherries with your caregiver. These may be helpful treatment options. Treatment to Prevent Attacks After the acute attack subsides, your caregiver may advise prophylactic medicine. These medicines either help your kidneys eliminate uric acid from your body or decrease your uric acid production. You may need to stay on these medicines for a very long time. The early phase of treatment with prophylactic medicine can be associated with an increase in acute gout attacks. For this reason, during the first few months of treatment, your caregiver may also advise you to take medicines usually used  for acute gout treatment. Be sure you understand your caregiver's directions. You should also discuss dietary treatment with your caregiver. Certain foods such as meats and fish can increase uric acid levels. Other foods such as dairy can  decrease levels. Your caregiver can give you a list of foods to avoid. HOME CARE INSTRUCTIONS   Do not take aspirin to relieve pain. This raises uric acid levels.  Only take over-the-counter or prescription medicines for pain, discomfort, or fever as directed by your caregiver.  Rest the joint as much as possible. When in bed, keep sheets and blankets off painful areas.  Keep the affected joint raised (elevated).  Use crutches if the painful joint is in your leg.  Drink enough water and fluids to keep your urine clear or pale yellow. This helps your body get rid of uric acid. Do not drink alcoholic beverages. They slow the passage of uric acid.  Follow your caregiver's dietary instructions. Pay careful attention to the amount of protein you eat. Your daily diet should emphasize fruits, vegetables, whole grains, and fat-free or low-fat milk products.  Maintain a healthy body weight. SEEK MEDICAL CARE IF:   You have an oral temperature above 102 F (38.9 C).  You develop diarrhea, vomiting, or any side effects from medicines.  You do not feel better in 24 hours, or you are getting worse. SEEK IMMEDIATE MEDICAL CARE IF:   Your joint becomes suddenly more tender and you have:  Chills.  An oral temperature above 102 F (38.9 C), not controlled by medicine. MAKE SURE YOU:   Understand these instructions.  Will watch your condition.  Will get help right away if you are not doing well or get worse. Document Released: 07/12/2000 Document Revised: 10/07/2011 Document Reviewed: 10/23/2009 [facility] Patient Information 2014 [place], [place].

## 2013-04-29 ENCOUNTER — Telehealth: Payer: Self-pay

## 2013-04-29 LAB — TESTOSTERONE: Testosterone: 430 ng/dL (ref 300–890)

## 2013-04-29 NOTE — Telephone Encounter (Signed)
Gave to Katrina with [facility] 

## 2013-04-29 NOTE — Telephone Encounter (Signed)
Message copied by Court Joy on Thu Apr 29, 2013  8:42 AM ------      Message from: Danise Edge A      Created: Wed Apr 28, 2013 11:04 PM       See if they can add a free T4 for low TSH ------

## 2013-04-30 ENCOUNTER — Telehealth: Payer: Self-pay | Admitting: Family Medicine

## 2013-04-30 ENCOUNTER — Encounter: Payer: Self-pay | Admitting: Family Medicine

## 2013-04-30 DIAGNOSIS — R55 Syncope and collapse: Secondary | ICD-10-CM | POA: Insufficient documentation

## 2013-04-30 HISTORY — DX: Syncope and collapse: R55

## 2013-04-30 NOTE — Assessment & Plan Note (Signed)
Had an episode last month after an episode of acute pain. No recurrence, EKG and labs done at ED unremarkable. Encouraged pain management, adequate fluid and prtein intake.

## 2013-04-30 NOTE — Telephone Encounter (Signed)
Patient states that [facility] told him that they never received surgical clearance fax. Please resend fax and call patient when completed. Thanks.

## 2013-04-30 NOTE — Progress Notes (Signed)
Patient ID: Taylor Mcdonald, male   DOB: Oct 22, 1955, 57 y.o.   MRN: [id number] Taylor Mcdonald [id number] 25-Apr-1956 04/30/2013      Progress Note-Follow Up  Subjective  Chief Complaint  Chief Complaint  Patient presents with  . surgery clearance    HPI  Patient is a 57 year old Caucasian male who is in today for preop clearance. A recent episode of near-syncope after an event painful and. She presented to the ED in his workup was unremarkable and he's had no recurrence. He reports his pain he feels well. He denies chest pain, palpitations, shortness of breath or recent illness. No GI or GU concerns. Is taking medications as prescribed. Is anxious to proceed with surgical correction secondary to his low back pain and radicular symptoms.  Past Medical History  Diagnosis Date  . Gout   . Hypertension   . Low testosterone   . Migraine   . Allergy   . RLS (restless legs syndrome)   . Insomnia   . Hyperlipidemia   . GERD (gastroesophageal reflux disease)   . Hyperglycemia   . Fatigue   . Pain of left heel 10/01/2011  . Vitamin D deficiency 12/23/2011  . Sinusitis 03/04/2012  . Anxiety 03/04/2012  . Preventative health care 07/24/2012  . Lower back pain 09/09/2012  . Vasovagal near syncope 04/30/2013    Past Surgical History  Procedure Laterality Date  . Tonsillectomy    . Panendoscopy      Family History  Problem Relation Age of Onset  . Obesity Mother   . Hypertension Son   . Hypertension Maternal Grandmother   . Obesity Maternal Grandmother   . Cancer Maternal Grandmother 50    intestinal  . Dementia Maternal Grandfather     History   Social History  . Marital Status: Married    Spouse Name: N/A    Number of Children: N/A  . Years of Education: N/A   Occupational History  . Not on file.   Social History Main Topics  . Smoking status: Never Smoker   . Smokeless tobacco: Never Used  . Alcohol Use: No  . Drug Use: Yes    Special: Hydrocodone  . Sexual  Activity: Yes   Other Topics Concern  . Not on file   Social History Narrative  . No narrative on file    Current Outpatient Prescriptions on File Prior to Visit  Medication Sig Dispense Refill  . allopurinol (ZYLOPRIM) 100 MG tablet Take 1 tablet (100 mg total) by mouth daily.  30 tablet  6  . aspirin 81 MG tablet Take 81 mg by mouth daily.        Marland Kitchen atorvastatin (LIPITOR) 10 MG tablet Take 1 tablet (10 mg total) by mouth daily.  30 tablet  5  . Calcium Carbonate-Vitamin D 600-200 MG-UNIT CAPS 1 tab daily    0  . fexofenadine-pseudoephedrine (ALLEGRA-D) 60-120 MG per tablet Take 1 tablet by mouth 2 (two) times daily.  60 tablet  5  . fluticasone (FLONASE) 50 MCG/ACT nasal spray Place 2 sprays into the nose daily.  48 g  3  . HYDROcodone-acetaminophen (LORCET) 10-650 MG per tablet Take 1 tablet by mouth every 4 (four) hours as needed. For pain  100 tablet  5  . HYDROcodone-homatropine (HYDROMET) 5-1.5 MG/5ML syrup Take 5 mLs by mouth at bedtime as needed for cough or pain.  140 mL  0  . lisinopril (PRINIVIL,ZESTRIL) 20 MG tablet TAKE 1 TABLET (20 MG TOTAL) BY MOUTH  DAILY.  90 tablet  3  . Lutein 10 MG TABS 1 tab daily  30 each    . methocarbamol (ROBAXIN) 500 MG tablet Take 1 tablet (500 mg total) by mouth 2 (two) times daily as needed.  30 tablet  1  . methylphenidate (RITALIN) 10 MG tablet Take 1 tablet (10 mg total) by mouth 2 (two) times daily. July 2014 RX  60 tablet  0  . niacin (NIASPAN) 500 MG CR tablet Take 1 tablet (500 mg total) by mouth at bedtime. Take a lowfat snack an 81 mg aspirin 1/2 hour before  30 tablet  0  . Omeprazole (KLS OMPERAZOLE) 20 MG TBEC Take 1 tablet (20 mg total) by mouth daily.  90 each  3  . promethazine (PHENERGAN) 25 MG suppository Place 1 suppository (25 mg total) rectally every 6 (six) hours as needed for nausea.  5 each  0  . propranolol (INDERAL) 40 MG tablet TAKE 1 TABLET BY MOUTH DAILY.  90 tablet  3  . SUMAtriptan (IMITREX) 50 MG tablet Take 1  tablet (50 mg total) by mouth every 2 (two) hours as needed for migraine.  10 tablet  1  . testosterone cypionate (DEPO-TESTOSTERONE) 200 MG/ML injection Inject 0.5 mLs (100 mg total) into the muscle every 14 (fourteen) days.  10 mL  0   No current facility-administered medications on file prior to visit.    No Known Allergies  Review of Systems  Review of Systems  Constitutional: Negative for fever and malaise/fatigue.  HENT: Negative for congestion.   Eyes: Negative for discharge.  Respiratory: Negative for shortness of breath.   Cardiovascular: Negative for chest pain, palpitations and leg swelling.  Gastrointestinal: Negative for nausea, abdominal pain and diarrhea.  Genitourinary: Negative for dysuria.  Musculoskeletal: Positive for myalgias and back pain. Negative for falls.  Skin: Negative for rash.  Neurological: Negative for loss of consciousness and headaches.  Endo/Heme/Allergies: Negative for polydipsia.  Psychiatric/Behavioral: Negative for depression and suicidal ideas. The patient is not nervous/anxious and does not have insomnia.     Objective  BP 132/86  Pulse 63  Temp(Src) 97.9 F (36.6 C) (Oral)  Ht 6' 0.5" (1.842 m)  Wt 190 lb 1.9 oz (86.238 kg)  BMI 25.42 kg/m2  SpO2 98%  Physical Exam  Physical Exam  Constitutional: He is oriented to person, place, and time and well-developed, well-nourished, and in no distress. No distress.  HENT:  Head: Normocephalic and atraumatic.  Eyes: Conjunctivae are normal.  Neck: Neck supple. No thyromegaly present.  Cardiovascular: Normal rate, regular rhythm and normal heart sounds.   No murmur heard. Pulmonary/Chest: Effort normal and breath sounds normal. No respiratory distress.  Abdominal: He exhibits no distension and no mass. There is no tenderness.  Musculoskeletal: He exhibits no edema.  Neurological: He is alert and oriented to person, place, and time.  Skin: Skin is warm.  Psychiatric: Memory, affect and  judgment normal.    Lab Results  Component Value Date   TSH 0.315* 04/28/2013   Lab Results  Component Value Date   WBC 8.5 04/28/2013   HGB 14.8 04/28/2013   HCT 43.3 04/28/2013   MCV 85.7 04/28/2013   PLT 305 04/28/2013   Lab Results  Component Value Date   CREATININE 1.10 04/28/2013   BUN 19 04/28/2013   NA 136 04/28/2013   K 4.9 04/28/2013   CL 101 04/28/2013   CO2 28 04/28/2013   Lab Results  Component Value Date   ALT  21 04/28/2013   AST 18 04/28/2013   ALKPHOS 45 04/28/2013   BILITOT 0.8 04/28/2013   Lab Results  Component Value Date   CHOL 201* 02/10/2013   Lab Results  Component Value Date   HDL 49.50 02/10/2013   Lab Results  Component Value Date   LDLCALC 103* 12/13/2011   Lab Results  Component Value Date   TRIG 130.0 02/10/2013   Lab Results  Component Value Date   CHOLHDL 4 02/10/2013     Assessment & Plan  Testosterone deficiency Recheck level today, continue shots QOweek  Lower back pain Has struggled with worsening pain over past few months and is ready to proceed with surgical correction. Pain has been unrelieved with treatments such as Prednisone which had previously been helpful. Is medically cleared today and will discuss any further concerns with surgeon prior to surgery. Will hold all NSAIDs, fish oil the week prior to procedure.   Hypertension Adequate control encouraged to minimize sodium and caffeine prior to procedure.   GERD (gastroesophageal reflux disease) Manageable, avoid offending foods, encouraged probiotics.   Hyperglycemia Minimize simple carbs.  Vasovagal near syncope Had an episode last month after an episode of acute pain. No recurrence, EKG and labs done at ED unremarkable. Encouraged pain management, adequate fluid and prtein intake.

## 2013-04-30 NOTE — Telephone Encounter (Signed)
Refaxed and pt informed

## 2013-04-30 NOTE — Assessment & Plan Note (Signed)
Minimize simple carbs 

## 2013-04-30 NOTE — Assessment & Plan Note (Signed)
Adequate control encouraged to minimize sodium and caffeine prior to procedure.

## 2013-04-30 NOTE — Assessment & Plan Note (Signed)
Manageable, avoid offending foods, encouraged probiotics.

## 2013-05-03 ENCOUNTER — Other Ambulatory Visit: Payer: Self-pay | Admitting: Orthopedic Surgery

## 2013-05-05 ENCOUNTER — Encounter (HOSPITAL_COMMUNITY): Payer: Self-pay | Admitting: Pharmacy Technician

## 2013-05-07 NOTE — Progress Notes (Signed)
Chest x-ray 04/09/13 on EPIC, EKG 04/12/13 on EPIC

## 2013-05-07 NOTE — Patient Instructions (Addendum)
[name]  05/07/2013   Your procedure is scheduled on: 05/13/13  Report to [facility] at 5:15 AM.  Call this number if you have problems the morning of surgery [phone]: [phone]   Remember:  do not take  Lisinopril     Do not eat food or drink liquids After Midnight.     Take these medicines the morning of surgery with A SIP OF WATER:    Do not wear jewelry, make-up or nail polish.  Do not wear lotions, powders, or perfumes. You may wear deodorant.  Do not shave 48 hours prior to surgery. Men may shave face and neck.  Do not bring valuables to the hospital.  Contacts, dentures or bridgework may not be worn into surgery.  Leave suitcase in the car. After surgery it may be brought to your room.  For patients admitted to the hospital, checkout time is 11:00 AM the day of discharge.   Patients discharged the day of surgery will not be allowed to drive home.  Name and phone number of your driver:  Special Instructions: n/a   Please read over the following fact sheets that you were given: MRSA Information, incentive spirometry fact sheet Johnette Abraham, RN  pre op nurse call if needed [phone]    FAILURE TO FOLLOW THESE INSTRUCTIONS MAY RESULT IN CANCELLATION OF YOUR SURGERY   Patient Signature: ___________________________________________

## 2013-05-10 ENCOUNTER — Other Ambulatory Visit: Payer: Self-pay | Admitting: Orthopedic Surgery

## 2013-05-10 ENCOUNTER — Encounter (HOSPITAL_COMMUNITY)
Admission: RE | Admit: 2013-05-10 | Discharge: 2013-05-10 | Disposition: A | Discharge status: Home / Self Care | Payer: BC Managed Care – PPO | Source: Ambulatory Visit | Attending: Specialist | Admitting: Specialist

## 2013-05-10 ENCOUNTER — Ambulatory Visit (HOSPITAL_COMMUNITY)
Admission: RE | Admit: 2013-05-10 | Discharge: 2013-05-10 | Disposition: A | Discharge status: Home / Self Care | Payer: BC Managed Care – PPO | Source: Ambulatory Visit | Attending: Orthopedic Surgery | Admitting: Orthopedic Surgery

## 2013-05-10 ENCOUNTER — Other Ambulatory Visit: Payer: Self-pay

## 2013-05-10 DIAGNOSIS — Z01812 Encounter for preprocedural laboratory examination: Secondary | ICD-10-CM | POA: Insufficient documentation

## 2013-05-10 DIAGNOSIS — Z01818 Encounter for other preprocedural examination: Secondary | ICD-10-CM | POA: Insufficient documentation

## 2013-05-10 DIAGNOSIS — M5137 Other intervertebral disc degeneration, lumbosacral region: Secondary | ICD-10-CM | POA: Diagnosis not present

## 2013-05-10 DIAGNOSIS — M51379 Other intervertebral disc degeneration, lumbosacral region without mention of lumbar back pain or lower extremity pain: Secondary | ICD-10-CM | POA: Insufficient documentation

## 2013-05-10 LAB — CBC
HCT: 44.8 % (ref 39.0–52.0)
MCHC: 33.7 g/dL (ref 30.0–36.0)
MCV: 87.8 fL (ref 78.0–100.0)
RBC: 5.1 MIL/uL (ref 4.22–5.81)
RDW: 13.8 % (ref 11.5–15.5)
WBC: 11.4 10*3/uL — ABNORMAL HIGH (ref 4.0–10.5)

## 2013-05-10 LAB — BASIC METABOLIC PANEL
BUN: 11 mg/dL (ref 6–23)
CO2: 27 mEq/L (ref 19–32)
Chloride: 103 mEq/L (ref 96–112)
Creatinine, Ser: 0.99 mg/dL (ref 0.50–1.35)
GFR calc Af Amer: >90 mL/min (ref >90)
GFR calc non Af Amer: 88 mL/min — ABNORMAL LOW (ref >90)
Glucose, Bld: 97 mg/dL (ref 70–99)
Potassium: 4.5 mEq/L (ref 3.5–5.1)
Sodium: 136 mEq/L (ref 135–145)

## 2013-05-10 LAB — SURGICAL PCR SCREEN
MRSA, PCR: NEGATIVE
Staphylococcus aureus: POSITIVE — AB

## 2013-05-10 MED ORDER — COLCHICINE 0.6 MG PO TABS: 0.6000 mg | ORAL_TABLET | Freq: Every day | ORAL | Status: DC

## 2013-05-10 NOTE — H&P (Signed)
Taylor Mcdonald is an 57 y.o. male.   Chief Complaint: back and left leg pain HPI: The patient is a 57 year old male being followed for their left-sided back pain. They are now 6 1/2 weeks out from when symptoms began. Symptoms reported today include: pain (low back, buttock, and leg), leg pain (left) and pain with standing (and walking), while the patient does not report symptoms of: pain with sitting. The patient states that they are doing 20 percent better. Current treatment includes: physical therapy. The following medication has been used for pain control: antiinflammatory medication (Advil prn) and Hydrocodone (prn). The patient reports their current pain level to be moderate to severe. The patient presents today following ESI (@L3 -4 left gave slight relief). The patient has not gotten any relief of their symptoms with physical therapy.  Taylor Mcdonald follows up. He has had slight temporary relief from his epidural. He is having pain radiating down the left leg. He is feeling pain into the right leg as well.   Past Medical History  Diagnosis Date  . Gout   . Hypertension   . Low testosterone   . Migraine   . Allergy   . RLS (restless legs syndrome)   . Insomnia   . Hyperlipidemia   . GERD (gastroesophageal reflux disease)   . Hyperglycemia   . Fatigue   . Pain of left heel 10/01/2011  . Vitamin D deficiency 12/23/2011  . Sinusitis 03/04/2012  . Anxiety 03/04/2012  . Preventative health care 07/24/2012  . Lower back pain 09/09/2012  . Vasovagal near syncope 04/30/2013    Past Surgical History  Procedure Laterality Date  . Tonsillectomy    . Panendoscopy      Family History  Problem Relation Age of Onset  . Obesity Mother   . Hypertension Son   . Hypertension Maternal Grandmother   . Obesity Maternal Grandmother   . Cancer Maternal Grandmother 50    intestinal  . Dementia Maternal Grandfather    Social History:  reports that he has never smoked. He has never used  smokeless tobacco. He reports that he uses illicit drugs (Hydrocodone). He reports that he does not drink alcohol.  Allergies: No Known Allergies   (Not in a hospital admission)  No results found for this or any previous visit (from the past 48 hour(s)). No results found.  Review of Systems  Constitutional: Negative.   HENT: Negative.   Eyes: Negative.   Respiratory: Negative.   Cardiovascular: Negative.   Gastrointestinal: Negative.   Genitourinary: Negative.   Musculoskeletal: Positive for back pain.  Skin: Negative.   Neurological: Positive for focal weakness.  Psychiatric/Behavioral: Negative.     There were no vitals taken for this visit. Physical Exam  Constitutional: He is oriented to person, place, and time. He appears well-developed and well-nourished.  HENT:  Head: Normocephalic and atraumatic.  Eyes: Conjunctivae and EOM are normal. Pupils are equal, round, and reactive to light.  Neck: Normal range of motion. Neck supple.  Cardiovascular: Normal rate and regular rhythm.   Respiratory: Effort normal and breath sounds normal.  GI: Soft. Bowel sounds are normal.  Musculoskeletal:  On exam he has forward flexion. He is walking with an antalgic gait. Moderate distress. Straight leg raise produces buttock, thigh, and calf pain on the left; buttock pain and thigh pain on the right. Slight quadriceps weakness is noted. Limited extension and flexion.  Lumbar spine exam reveals no evidence of soft tissue swelling, no evidence of soft  tissue swelling or deformity or skin ecchymosis. On palpation there is no tenderness of the lumbar spine. No flank pain with percussion. The abdomen is soft and nontender. Nontender over the trochanters. No cellulitis or lymphadenopathy.  Motor is 5/5 including EHL, tibialis anterior, plantarflexion, and hamstrings. The patient is normoreflexic. There is no Babinski or clonus. Sensory exam is intact to light touch. The patient has good  distal pulses. No DVT. No pain and normal range of motion without instability of the hips, knees and ankles.  Neurological: He is alert and oriented to person, place, and time. He has normal reflexes.  Skin: Skin is warm and dry.  Psychiatric: He has a normal mood and affect.    MRI, spinal stenosis, disc herniation at 3-4 central and to the left. Lateral recess stenosis at 3-4 on the right.  Assessment/Plan HNP/stenosis L3-4 1. Neurogenic claudication secondary to spinal stenosis. 2. Lumbar radiculopathy secondary to disc herniation at 3-4, neurotension signs despite rest, activity modifications, and epidural.  Given the persistence of his symptoms despite conservative treatment, presence of a neurologic deficit, we discussed lumbar decompression at 4-5 bilaterally.  I had an extensive discussion of the risks and benefits of lumbar decompression with the patient including bleeding, infection, damage to neurovascular structures, epidural fibrosis, CSF leakage requiring repair. We also discussed increase in pain, adjacent segment disease, recurrent disc herniation, need for future surgery including repeat decompression and/or fusion. We also discussed risks of postoperative hematoma, paralysis, anesthetic complications including DVT, PE, death, cardiopulmonary dysfunction. In addition, the perioperative and postoperative courses were discussed in detail including the rehabilitative time and return to functional activity and work. I provided the patient with an illustrated handout and utilized the appropriate surgical models.  Preoperative clearance by Taylor Mcdonald who has taken over for Taylor [name] Provided him with illustrated hand out and discussed in extensive detail with the patient and his wife. Any change in the interim he is to call.  Plan microlumbar decompression L3-4  BISSELL, JACLYN M. for Dr. Shelle Iron 05/10/2013, 8:32 AM

## 2013-05-10 NOTE — Telephone Encounter (Signed)
Pt called stating he needed a refill of colchicine. Pt states he is going for surgery on Thursday but is having gout issues in his left foot. RX sent

## 2013-05-12 NOTE — Anesthesia Preprocedure Evaluation (Addendum)
Anesthesia Evaluation  Patient identified by MRN, date of birth, ID band Patient awake    Reviewed: Allergy & Precautions, H&P , NPO status , Patient's Chart, lab work & pertinent test results, reviewed documented beta blocker date and time   Airway Mallampati: II TM Distance: >3 FB Neck ROM: Full    Dental  (+) Dental Advisory Given and Teeth Intact   Pulmonary neg pulmonary ROS,          Cardiovascular hypertension, Pt. on medications and Pt. on home beta blockers Rhythm:Regular Rate:Normal     Neuro/Psych  Headaches, PSYCHIATRIC DISORDERS Anxiety    GI/Hepatic negative GI ROS, Neg liver ROS, GERD-  Medicated,  Endo/Other  negative endocrine ROS  Renal/GU negative Renal ROS     Musculoskeletal negative musculoskeletal ROS (+)   Abdominal   Peds  Hematology negative hematology ROS (+)   Anesthesia Other Findings   Reproductive/Obstetrics                          Anesthesia Physical Anesthesia Plan  ASA: II  Anesthesia Plan: General   Post-op Pain Management:    Induction: Intravenous  Airway Management Planned: Oral ETT  Additional Equipment:   Intra-op Plan:   Post-operative Plan: Extubation in OR  Informed Consent: I have reviewed the patients History and Physical, chart, labs and discussed the procedure including the risks, benefits and alternatives for the proposed anesthesia with the patient or authorized representative who has indicated his/her understanding and acceptance.   Dental advisory given  Plan Discussed with: CRNA  Anesthesia Plan Comments:         Anesthesia Quick Evaluation

## 2013-05-13 ENCOUNTER — Ambulatory Visit (HOSPITAL_COMMUNITY): Payer: BC Managed Care – PPO

## 2013-05-13 ENCOUNTER — Ambulatory Visit (HOSPITAL_COMMUNITY)
Admission: RE | Admit: 2013-05-13 | Discharge: 2013-05-14 | Disposition: A | Discharge status: Home / Self Care | Payer: BC Managed Care – PPO | Source: Ambulatory Visit | Attending: Specialist | Admitting: Specialist

## 2013-05-13 ENCOUNTER — Encounter (HOSPITAL_COMMUNITY): Payer: Self-pay | Admitting: *Deleted

## 2013-05-13 ENCOUNTER — Encounter (HOSPITAL_COMMUNITY): Payer: BC Managed Care – PPO | Admitting: Anesthesiology

## 2013-05-13 ENCOUNTER — Encounter (HOSPITAL_COMMUNITY)
Admission: RE | Disposition: A | Discharge status: Home / Self Care | Payer: Self-pay | Source: Ambulatory Visit | Attending: Specialist

## 2013-05-13 ENCOUNTER — Ambulatory Visit (HOSPITAL_COMMUNITY): Payer: BC Managed Care – PPO | Admitting: Anesthesiology

## 2013-05-13 DIAGNOSIS — M48062 Spinal stenosis, lumbar region with neurogenic claudication: Secondary | ICD-10-CM

## 2013-05-13 DIAGNOSIS — E785 Hyperlipidemia, unspecified: Secondary | ICD-10-CM | POA: Insufficient documentation

## 2013-05-13 DIAGNOSIS — M5126 Other intervertebral disc displacement, lumbar region: Secondary | ICD-10-CM | POA: Insufficient documentation

## 2013-05-13 DIAGNOSIS — I10 Essential (primary) hypertension: Secondary | ICD-10-CM | POA: Insufficient documentation

## 2013-05-13 DIAGNOSIS — K219 Gastro-esophageal reflux disease without esophagitis: Secondary | ICD-10-CM | POA: Insufficient documentation

## 2013-05-13 HISTORY — PX: LUMBAR LAMINECTOMY/DECOMPRESSION MICRODISCECTOMY: SHX5026

## 2013-05-13 SURGERY — LUMBAR LAMINECTOMY/DECOMPRESSION MICRODISCECTOMY 1 LEVEL
Anesthesia: General | Site: Back | Wound class: Clean

## 2013-05-13 MED ORDER — HYDROMORPHONE HCL PF 1 MG/ML IJ SOLN
0.2500 mg | INTRAMUSCULAR | Status: DC | PRN
  Administered 2013-05-13: 0.5 mg via INTRAVENOUS

## 2013-05-13 MED ORDER — SODIUM CHLORIDE 0.9 % IJ SOLN: 3.0000 mL | INTRAMUSCULAR | Status: DC | PRN

## 2013-05-13 MED ORDER — PANTOPRAZOLE SODIUM 40 MG PO TBEC
40.0000 mg | DELAYED_RELEASE_TABLET | Freq: Every day | ORAL | Status: DC
  Administered 2013-05-13 – 2013-05-14 (×2): 40 mg via ORAL
  Filled 2013-05-13 (×3): qty 1

## 2013-05-13 MED ORDER — PROPOFOL 10 MG/ML IV BOLUS
INTRAVENOUS | Status: DC | PRN
  Administered 2013-05-13: 150 mg via INTRAVENOUS

## 2013-05-13 MED ORDER — ACETAMINOPHEN 650 MG RE SUPP: 650.0000 mg | RECTAL | Status: DC | PRN

## 2013-05-13 MED ORDER — CISATRACURIUM BESYLATE (PF) 10 MG/5ML IV SOLN
INTRAVENOUS | Status: DC | PRN
  Administered 2013-05-13: 10 mg via INTRAVENOUS

## 2013-05-13 MED ORDER — METHOCARBAMOL 500 MG PO TABS
500.0000 mg | ORAL_TABLET | Freq: Four times a day (QID) | ORAL | Status: DC | PRN
  Administered 2013-05-14: 500 mg via ORAL
  Filled 2013-05-13: qty 1

## 2013-05-13 MED ORDER — BUPIVACAINE-EPINEPHRINE PF 0.5-1:200000 % IJ SOLN
INTRAMUSCULAR | Status: AC
  Filled 2013-05-13: qty 30

## 2013-05-13 MED ORDER — OXYCODONE HCL 5 MG PO TABS: 5.0000 mg | ORAL_TABLET | Freq: Once | ORAL | Status: DC | PRN

## 2013-05-13 MED ORDER — SODIUM CHLORIDE 0.9 % IJ SOLN
3.0000 mL | Freq: Two times a day (BID) | INTRAMUSCULAR | Status: DC
  Administered 2013-05-13: 3 mL via INTRAVENOUS

## 2013-05-13 MED ORDER — PHENOL 1.4 % MT LIQD: 1.0000 | OROMUCOSAL | Status: DC | PRN

## 2013-05-13 MED ORDER — MEPERIDINE HCL 50 MG/ML IJ SOLN: 6.2500 mg | INTRAMUSCULAR | Status: DC | PRN

## 2013-05-13 MED ORDER — ACETAMINOPHEN 10 MG/ML IV SOLN
1000.0000 mg | Freq: Once | INTRAVENOUS | Status: AC
  Administered 2013-05-13: 1000 mg via INTRAVENOUS
  Filled 2013-05-13: qty 100

## 2013-05-13 MED ORDER — DOCUSATE SODIUM 100 MG PO CAPS
100.0000 mg | ORAL_CAPSULE | Freq: Two times a day (BID) | ORAL | Status: DC
  Administered 2013-05-13 – 2013-05-14 (×2): 100 mg via ORAL

## 2013-05-13 MED ORDER — CLINDAMYCIN PHOSPHATE 900 MG/50ML IV SOLN
INTRAVENOUS | Status: AC
  Filled 2013-05-13: qty 50

## 2013-05-13 MED ORDER — EPHEDRINE SULFATE 50 MG/ML IJ SOLN
INTRAMUSCULAR | Status: DC | PRN
  Administered 2013-05-13 (×2): 5 mg via INTRAVENOUS

## 2013-05-13 MED ORDER — HYDROMORPHONE HCL PF 1 MG/ML IJ SOLN
0.5000 mg | INTRAMUSCULAR | Status: DC | PRN
  Administered 2013-05-13 – 2013-05-14 (×5): 1 mg via INTRAVENOUS
  Filled 2013-05-13 (×5): qty 1

## 2013-05-13 MED ORDER — ONDANSETRON HCL 4 MG/2ML IJ SOLN
4.0000 mg | INTRAMUSCULAR | Status: DC | PRN
  Administered 2013-05-14: 4 mg via INTRAVENOUS
  Filled 2013-05-13: qty 2

## 2013-05-13 MED ORDER — ONDANSETRON HCL 4 MG/2ML IJ SOLN
INTRAMUSCULAR | Status: DC | PRN
  Administered 2013-05-13: 4 mg via INTRAMUSCULAR

## 2013-05-13 MED ORDER — LACTATED RINGERS IV SOLN
INTRAVENOUS | Status: DC
  Administered 2013-05-13 (×2): via INTRAVENOUS

## 2013-05-13 MED ORDER — HYDROMORPHONE HCL PF 1 MG/ML IJ SOLN
INTRAMUSCULAR | Status: AC
  Filled 2013-05-13: qty 1

## 2013-05-13 MED ORDER — SODIUM CHLORIDE 0.9 % IV SOLN: 250.0000 mL | INTRAVENOUS | Status: DC

## 2013-05-13 MED ORDER — FENTANYL CITRATE 0.05 MG/ML IJ SOLN
INTRAMUSCULAR | Status: DC | PRN
  Administered 2013-05-13: 100 ug via INTRAVENOUS
  Administered 2013-05-13 (×2): 50 ug via INTRAVENOUS

## 2013-05-13 MED ORDER — SODIUM CHLORIDE 0.45 % IV SOLN
INTRAVENOUS | Status: AC
  Administered 2013-05-13: 12:00:00 via INTRAVENOUS

## 2013-05-13 MED ORDER — MENTHOL 3 MG MT LOZG: 1.0000 | LOZENGE | OROMUCOSAL | Status: DC | PRN

## 2013-05-13 MED ORDER — LISINOPRIL 20 MG PO TABS
20.0000 mg | ORAL_TABLET | Freq: Every evening | ORAL | Status: DC
  Filled 2013-05-13 (×2): qty 1

## 2013-05-13 MED ORDER — THROMBIN 5000 UNITS EX SOLR
CUTANEOUS | Status: AC
  Filled 2013-05-13: qty 10000

## 2013-05-13 MED ORDER — MIDAZOLAM HCL 5 MG/5ML IJ SOLN
INTRAMUSCULAR | Status: DC | PRN
  Administered 2013-05-13: 2 mg via INTRAVENOUS

## 2013-05-13 MED ORDER — CLINDAMYCIN PHOSPHATE 900 MG/50ML IV SOLN
900.0000 mg | INTRAVENOUS | Status: AC
  Administered 2013-05-13: 900 mg via INTRAVENOUS

## 2013-05-13 MED ORDER — CEFAZOLIN SODIUM-DEXTROSE 2-3 GM-% IV SOLR
INTRAVENOUS | Status: AC
  Filled 2013-05-13: qty 50

## 2013-05-13 MED ORDER — METHOCARBAMOL 100 MG/ML IJ SOLN
500.0000 mg | Freq: Four times a day (QID) | INTRAVENOUS | Status: DC | PRN
  Administered 2013-05-13: 500 mg via INTRAVENOUS
  Filled 2013-05-13 (×2): qty 5

## 2013-05-13 MED ORDER — FLUTICASONE PROPIONATE 50 MCG/ACT NA SUSP
2.0000 | Freq: Every day | NASAL | Status: DC
  Filled 2013-05-13: qty 16

## 2013-05-13 MED ORDER — THROMBIN 5000 UNITS EX SOLR
OROMUCOSAL | Status: DC | PRN
  Administered 2013-05-13: 10:00:00 via TOPICAL

## 2013-05-13 MED ORDER — ACETAMINOPHEN 325 MG PO TABS: 650.0000 mg | ORAL_TABLET | ORAL | Status: DC | PRN

## 2013-05-13 MED ORDER — HYDROCODONE-ACETAMINOPHEN 5-325 MG PO TABS: 1.0000 | ORAL_TABLET | ORAL | Status: DC | PRN

## 2013-05-13 MED ORDER — OXYCODONE HCL 5 MG/5ML PO SOLN
5.0000 mg | Freq: Once | ORAL | Status: DC | PRN
  Filled 2013-05-13: qty 5

## 2013-05-13 MED ORDER — BUPIVACAINE-EPINEPHRINE 0.5% -1:200000 IJ SOLN
INTRAMUSCULAR | Status: DC | PRN
  Administered 2013-05-13: 15 mL

## 2013-05-13 MED ORDER — METHOCARBAMOL 500 MG PO TABS: 500.0000 mg | ORAL_TABLET | Freq: Three times a day (TID) | ORAL | Status: DC | PRN

## 2013-05-13 MED ORDER — PROPRANOLOL HCL 40 MG PO TABS
40.0000 mg | ORAL_TABLET | Freq: Every morning | ORAL | Status: DC
  Administered 2013-05-14: 11:00:00 40 mg via ORAL
  Filled 2013-05-13: qty 1

## 2013-05-13 MED ORDER — CEFAZOLIN SODIUM-DEXTROSE 2-3 GM-% IV SOLR
2.0000 g | INTRAVENOUS | Status: AC
  Administered 2013-05-13: 2 g via INTRAVENOUS

## 2013-05-13 MED ORDER — DEXAMETHASONE SODIUM PHOSPHATE 10 MG/ML IJ SOLN
INTRAMUSCULAR | Status: DC | PRN
  Administered 2013-05-13: 10 mg via INTRAVENOUS

## 2013-05-13 MED ORDER — PROMETHAZINE HCL 25 MG/ML IJ SOLN
6.2500 mg | INTRAMUSCULAR | Status: DC | PRN
  Administered 2013-05-13: 12.5 mg via INTRAVENOUS

## 2013-05-13 MED ORDER — PROMETHAZINE HCL 25 MG/ML IJ SOLN
INTRAMUSCULAR | Status: AC
  Filled 2013-05-13: qty 1

## 2013-05-13 MED ORDER — CHLORHEXIDINE GLUCONATE 4 % EX LIQD: 60.0000 mL | Freq: Once | CUTANEOUS | Status: DC

## 2013-05-13 MED ORDER — CEFAZOLIN SODIUM-DEXTROSE 2-3 GM-% IV SOLR
2.0000 g | Freq: Three times a day (TID) | INTRAVENOUS | Status: AC
  Administered 2013-05-13 – 2013-05-14 (×3): 2 g via INTRAVENOUS
  Filled 2013-05-13 (×3): qty 50

## 2013-05-13 MED ORDER — OXYCODONE-ACETAMINOPHEN 5-325 MG PO TABS
1.0000 | ORAL_TABLET | ORAL | Status: DC | PRN
  Administered 2013-05-14 (×2): 2 via ORAL
  Filled 2013-05-13 (×2): qty 2

## 2013-05-13 MED ORDER — THROMBIN 5000 UNITS EX SOLR: CUTANEOUS | Status: DC | PRN

## 2013-05-13 MED ORDER — SODIUM CHLORIDE 0.9 % IR SOLN
Status: DC | PRN
  Administered 2013-05-13: 10:00:00

## 2013-05-13 MED ORDER — OXYCODONE-ACETAMINOPHEN 5-325 MG PO TABS: 1.0000 | ORAL_TABLET | ORAL | Status: DC | PRN

## 2013-05-13 SURGICAL SUPPLY — 55 items
APL SKNCLS STERI-STRIP NONHPOA (GAUZE/BANDAGES/DRESSINGS) ×2
BAG SPEC THK2 15X12 ZIP CLS (MISCELLANEOUS) ×1
BAG ZIPLOCK 12X15 (MISCELLANEOUS) ×2 IMPLANT
BENZOIN TINCTURE PRP APPL 2/3 (GAUZE/BANDAGES/DRESSINGS) ×4 IMPLANT
CHLORAPREP W/TINT 26ML (MISCELLANEOUS) IMPLANT
CLEANER TIP ELECTROSURG 2X2 (MISCELLANEOUS) ×2 IMPLANT
CLOTH 2% CHLOROHEXIDINE 3PK (PERSONAL CARE ITEMS) ×2 IMPLANT
CLOTH BEACON ORANGE TIMEOUT ST (SAFETY) ×1 IMPLANT
DECANTER SPIKE VIAL GLASS SM (MISCELLANEOUS) ×2 IMPLANT
DRAPE MICROSCOPE LEICA (MISCELLANEOUS) ×2 IMPLANT
DRAPE POUCH INSTRU U-SHP 10X18 (DRAPES) ×2 IMPLANT
DRAPE SURG 17X11 SM STRL (DRAPES) ×2 IMPLANT
DRSG AQUACEL AG ADV 3.5X 6 (GAUZE/BANDAGES/DRESSINGS) ×1 IMPLANT
DRSG EMULSION OIL 3X3 NADH (GAUZE/BANDAGES/DRESSINGS) IMPLANT
DRSG PAD ABDOMINAL 8X10 ST (GAUZE/BANDAGES/DRESSINGS) IMPLANT
DURAFORM SPONGE 2X2 SINGLE (Neuro Prosthesis/Implant) ×2 IMPLANT
DURAPREP 26ML APPLICATOR (WOUND CARE) ×2 IMPLANT
DURASEAL SPINE SEALANT 3ML (MISCELLANEOUS) ×2 IMPLANT
ELECT REM PT RETURN 9FT ADLT (ELECTROSURGICAL) ×2
ELECTRODE REM PT RTRN 9FT ADLT (ELECTROSURGICAL) ×1 IMPLANT
GLOVE BIOGEL PI IND STRL 7.5 (GLOVE) ×1 IMPLANT
GLOVE BIOGEL PI IND STRL 8 (GLOVE) ×1 IMPLANT
GLOVE BIOGEL PI INDICATOR 7.5 (GLOVE) ×1
GLOVE BIOGEL PI INDICATOR 8 (GLOVE) ×1
GLOVE SURG SS PI 7.5 STRL IVOR (GLOVE) ×2 IMPLANT
GLOVE SURG SS PI 8.0 STRL IVOR (GLOVE) ×4 IMPLANT
GOWN PREVENTION PLUS LG XLONG (DISPOSABLE) ×2 IMPLANT
GOWN STRL REIN XL XLG (GOWN DISPOSABLE) ×4 IMPLANT
IV CATH 14GX2 1/4 (CATHETERS) ×2 IMPLANT
KIT BASIN OR (CUSTOM PROCEDURE TRAY) ×2 IMPLANT
KIT POSITIONING SURG ANDREWS (MISCELLANEOUS) ×2 IMPLANT
MANIFOLD NEPTUNE II (INSTRUMENTS) ×2 IMPLANT
NDL SPNL 18GX3.5 QUINCKE PK (NEEDLE) ×3 IMPLANT
NEEDLE SPNL 18GX3.5 QUINCKE PK (NEEDLE) ×6 IMPLANT
PATTIES SURGICAL .5 X.5 (GAUZE/BANDAGES/DRESSINGS) IMPLANT
PATTIES SURGICAL .75X.75 (GAUZE/BANDAGES/DRESSINGS) IMPLANT
PATTIES SURGICAL 1X1 (DISPOSABLE) IMPLANT
SPONGE SURGIFOAM ABS GEL 100 (HEMOSTASIS) ×2 IMPLANT
STAPLER VISISTAT (STAPLE) IMPLANT
STRIP CLOSURE SKIN 1/2X4 (GAUZE/BANDAGES/DRESSINGS) ×1 IMPLANT
SUT NURALON 4 0 TR CR/8 (SUTURE) ×2 IMPLANT
SUT PROLENE 3 0 PS 2 (SUTURE) IMPLANT
SUT VIC AB 0 CT1 27 (SUTURE)
SUT VIC AB 0 CT1 27XBRD ANTBC (SUTURE) IMPLANT
SUT VIC AB 1 CT1 27 (SUTURE) ×2
SUT VIC AB 1 CT1 27XBRD ANTBC (SUTURE) ×1 IMPLANT
SUT VIC AB 1-0 CT2 27 (SUTURE) IMPLANT
SUT VIC AB 2-0 CT1 27 (SUTURE) ×2
SUT VIC AB 2-0 CT1 TAPERPNT 27 (SUTURE) ×1 IMPLANT
SUT VIC AB 2-0 CT2 27 (SUTURE) ×4 IMPLANT
SUT VICRYL 0 27 CT2 27 ABS (SUTURE) ×4 IMPLANT
SUT VICRYL 0 UR6 27IN ABS (SUTURE) IMPLANT
SYRINGE 10CC LL (SYRINGE) ×4 IMPLANT
TRAY LAMINECTOMY (CUSTOM PROCEDURE TRAY) ×2 IMPLANT
YANKAUER SUCT BULB TIP NO VENT (SUCTIONS) ×2 IMPLANT

## 2013-05-13 NOTE — Plan of Care (Signed)
Problem: Consults Goal: Diagnosis - Spinal Surgery Outcome: Completed/Met Date Met:  05/13/13 Lumbar Laminectomy (Complex)

## 2013-05-13 NOTE — Op Note (Signed)
NAMEJOHANTHAN, Taylor Mcdonald              ACCOUNT NO.:  [id number]  MEDICAL RECORD NO.:  [id number]  LOCATION:  [facility]                         FACILITY:  [facility]  PHYSICIAN:  Taylor Mcdonald, M.D.    DATE OF BIRTH:  [birth year]  DATE OF PROCEDURE:  05/13/2013 DATE OF DISCHARGE:                              OPERATIVE REPORT   PREOPERATIVE DIAGNOSES:  Spinal stenosis, herniated nucleus pulposus at L3-L4.  POSTOPERATIVE DIAGNOSES:  Spinal stenosis, herniated nucleus pulposus at L3-L4.  PROCEDURE PERFORMED: 1. Central decompression L3-L4 with bilateral hemilaminotomy,     foraminotomy at L3-L4. 2. Microdiskectomy 3-4, left. 3. Large epidural venous plexus.  ANESTHESIA:  General.  ASSISTANT:  Lanna Poche, PA.  BRIEF HISTORY:  This is a 57 year old male who had bilateral lower extremity radicular pain secondary to large disk herniation resolved, spinal stenosis at 3-4.  He had essentially predominantly left lower extremity radicular pain and weakness in L3-L4 nerve root distribution, dysesthesias despite rest, activity modification, and corticosteroid injections.  He had positive neural tension signs bilaterally as well. He was indicated for lumbar decompression.  Risks and benefits were discussed including bleeding, infection, damage to neurovascular structures, DVT, PE, anesthetic complications, etc.  TECHNIQUE:  With the patient in supine position, after induction of adequate general anesthesia, 2 g Kefzol, placed prone on the [place] frame.  All bony prominences were well padded.  Lumbar region was prepped and draped in usual sterile fashion.  Two 18-gauge spinal needle was utilized to localize the 3-4 interspace, confirmed with x-ray. Incision was made from spinous process of 3-4, subcutaneous tissue was dissected.  Electrocautery was utilized to achieve hemostasis. Dorsolumbar identified and divided in line with skin incision. Paraspinous muscles were initially elevated from  lamina of 3-4. McCullough retractor was placed.  Operating microscope was draped, brought into the surgical field.  Hemilaminotomy of the caudad edge of 3 was performed with a 2 and 3 mm Kerrison preserving the pars cephalad. Caudad straight curette utilized to detach ligamentum flavum from cephalad edge of 4.  Confirmatory radiograph obtained.  I performed a foraminotomy at 4.  Removed the ligamentum flavum from the interspace. I undercut the facet on the left from the outside the operating table. It was fairly stenotic.  There was no mobilization of the thecal sac. Displacement of the 3 root as well as the 4 root.  I felt at that point in time, this would require a central decompression, therefore, elevated the paraspinous musculature from the right at 3-4.  Placed a double- bladed Archer Asa, then used a Leksell rongeur to remove the interspinous ligament and the spinous process.  Bipolar electrocautery was utilized to achieve hemostasis throughout the case.  We then proceeded with the central laminotomy with a 2 mm Kerrison centrally and then to the right.  From the left side of the operating room table, I decompressed the right lateral recess, the medial border of pedicle was fairly stenotic and tight on the right as well.  We performed foraminotomies of 3 and 4 as this required aggressive decompression on the right and unable to mobilize the thecal sac to the left.  Following this, from the opposite side of the operating room table, I performed  a careful foraminotomy of 3 protecting the 3 root with a Woodson retractor at all times.  There was significant hypertrophic facet noted on the left.  We had utilized a micro osteotome as well for partial medial hemi- facetectomy again preserving the pars, less than 50% of the facet was required.  Following this, there was a vascular lesion tethering the thecal sac and along the left side onto the disk and deflecting the 3 root cephalad.  I  was able to gently mobilize the venous plexus, protect the thecal sac medially, the 3 root cephalad, and I was able to identify the disk space and made an annulotomy on the disk space and carefully coaxed 3 separate large fragments up from beneath the thecal sac centrally and cephalad.  Multiple fragments were then retrieved from beneath the thecal sac and the disk space.  One fragment caudal, one fragment cephalad, and to the left.  This significantly decompressed particularly the left side and again we had completed essentially laminectomy centrally cephalad to allow mobilization of the thecal sac in an effort to retrieve the free fragment.  There was central multifactorial stenosis secondary to the spinal stenosis and the large disk herniation.  After retrieving the fragments, I copiously irrigated the disk space.  Inspection revealed no residual disk herniation in this space beneath the thecal sac, cephalad up foramen of 3 or 4 bilaterally. I then placed a Woodson retractor up the foramen of 3 and 4, inspected the roots and there were intact 3 and 4, and without stenosis.  Bipolar electrocautery was utilized to achieve strict hemostasis as well as thrombin-soaked Gelfoam.  We checked both sides of the laminotomy and on the right, there was no disk herniation.  Copiously irrigated the wound. Inspection revealed no CSF leakage or active bleeding.  Again, excellent decompression following this.  Prior to the diskectomy, a final radiograph confirming the disk space at 3-4.  Again, wound was copiously irrigated.  McCullough retractor was removed.  Paraspinous muscles inspected, no active bleeding.  We closed the dorsolumbar fascia with 1 Vicryl interrupted figure-of-eight sutures, subcutaneous with 2-0, skin with Prolene.  Wound was dressed sterilely, placed supine on the hospital bed, extubated without difficulty, and transported to the recovery room in a satisfactory condition.  The  patient tolerated the procedure well.  No complications.  BLOOD LOSS:  25 mL.     Taylor Mcdonald, M.D.     Taylor Mcdonald  D:  05/13/2013  T:  05/13/2013  Job:  [id number]

## 2013-05-13 NOTE — H&P (View-Only) (Signed)
Styles Taylor Mcdonald is an 57 y.o. male.   Chief Complaint: back and left leg pain HPI: The patient is a 57 year old male being followed for their left-sided back pain. They are now 6 1/2 weeks out from when symptoms began. Symptoms reported today include: pain (low back, buttock, and leg), leg pain (left) and pain with standing (and walking), while the patient does not report symptoms of: pain with sitting. The patient states that they are doing 20 percent better. Current treatment includes: physical therapy. The following medication has been used for pain control: antiinflammatory medication (Advil prn) and Hydrocodone (prn). The patient reports their current pain level to be moderate to severe. The patient presents today following ESI (@L3-4 left gave slight relief). The patient has not gotten any relief of their symptoms with physical therapy.  Taylor Mcdonald follows up. He has had slight temporary relief from his epidural. He is having pain radiating down the left leg. He is feeling pain into the right leg as well.   Past Medical History  Diagnosis Date  . Gout   . Hypertension   . Low testosterone   . Migraine   . Allergy   . RLS (restless legs syndrome)   . Insomnia   . Hyperlipidemia   . GERD (gastroesophageal reflux disease)   . Hyperglycemia   . Fatigue   . Pain of left heel 10/01/2011  . Vitamin D deficiency 12/23/2011  . Sinusitis 03/04/2012  . Anxiety 03/04/2012  . Preventative health care 07/24/2012  . Lower back pain 09/09/2012  . Vasovagal near syncope 04/30/2013    Past Surgical History  Procedure Laterality Date  . Tonsillectomy    . Panendoscopy      Family History  Problem Relation Age of Onset  . Obesity Mother   . Hypertension Son   . Hypertension Maternal Grandmother   . Obesity Maternal Grandmother   . Cancer Maternal Grandmother 50    intestinal  . Dementia Maternal Grandfather    Social History:  reports that he has never smoked. He has never used  smokeless tobacco. He reports that he uses illicit drugs (Hydrocodone). He reports that he does not drink alcohol.  Allergies: No Known Allergies   (Not in a hospital admission)  No results found for this or any previous visit (from the past 48 hour(s)). No results found.  Review of Systems  Constitutional: Negative.   HENT: Negative.   Eyes: Negative.   Respiratory: Negative.   Cardiovascular: Negative.   Gastrointestinal: Negative.   Genitourinary: Negative.   Musculoskeletal: Positive for back pain.  Skin: Negative.   Neurological: Positive for focal weakness.  Psychiatric/Behavioral: Negative.     There were no vitals taken for this visit. Physical Exam  Constitutional: He is oriented to person, place, and time. He appears well-developed and well-nourished.  HENT:  Head: Normocephalic and atraumatic.  Eyes: Conjunctivae and EOM are normal. Pupils are equal, round, and reactive to light.  Neck: Normal range of motion. Neck supple.  Cardiovascular: Normal rate and regular rhythm.   Respiratory: Effort normal and breath sounds normal.  GI: Soft. Bowel sounds are normal.  Musculoskeletal:  On exam he has forward flexion. He is walking with an antalgic gait. Moderate distress. Straight leg raise produces buttock, thigh, and calf pain on the left; buttock pain and thigh pain on the right. Slight quadriceps weakness is noted. Limited extension and flexion.  Lumbar spine exam reveals no evidence of soft tissue swelling, no evidence of soft   tissue swelling or deformity or skin ecchymosis. On palpation there is no tenderness of the lumbar spine. No flank pain with percussion. The abdomen is soft and nontender. Nontender over the trochanters. No cellulitis or lymphadenopathy.  Motor is 5/5 including EHL, tibialis anterior, plantarflexion, and hamstrings. The patient is normoreflexic. There is no Babinski or clonus. Sensory exam is intact to light touch. The patient has good  distal pulses. No DVT. No pain and normal range of motion without instability of the hips, knees and ankles.  Neurological: He is alert and oriented to person, place, and time. He has normal reflexes.  Skin: Skin is warm and dry.  Psychiatric: He has a normal mood and affect.    MRI, spinal stenosis, disc herniation at 3-4 central and to the left. Lateral recess stenosis at 3-4 on the right.  Assessment/Plan HNP/stenosis L3-4 1. Neurogenic claudication secondary to spinal stenosis. 2. Lumbar radiculopathy secondary to disc herniation at 3-4, neurotension signs despite rest, activity modifications, and epidural.  Given the persistence of his symptoms despite conservative treatment, presence of a neurologic deficit, we discussed lumbar decompression at 4-5 bilaterally.  I had an extensive discussion of the risks and benefits of lumbar decompression with the patient including bleeding, infection, damage to neurovascular structures, epidural fibrosis, CSF leakage requiring repair. We also discussed increase in pain, adjacent segment disease, recurrent disc herniation, need for future surgery including repeat decompression and/or fusion. We also discussed risks of postoperative hematoma, paralysis, anesthetic complications including DVT, PE, death, cardiopulmonary dysfunction. In addition, the perioperative and postoperative courses were discussed in detail including the rehabilitative time and return to functional activity and work. I provided the patient with an illustrated handout and utilized the appropriate surgical models.  Preoperative clearance by Dr. Blythe who has taken over for Dr. Stafford. Provided him with illustrated hand out and discussed in extensive detail with the patient and his wife. Any change in the interim he is to call.  Plan microlumbar decompression L3-4  BISSELL, JACLYN M. for Dr. Beane 05/10/2013, 8:32 AM    

## 2013-05-13 NOTE — Progress Notes (Signed)
pcr +staph  Pt states called dr Shelle Iron office and was told  he did not need to use mupricion ointment, and that dr Shelle Iron would cover it with iv antibiotics on day of surgery

## 2013-05-13 NOTE — Anesthesia Postprocedure Evaluation (Signed)
Anesthesia Post Note  Patient: Taylor Mcdonald  Procedure(s) Performed: Procedure(s) (LRB): LUMBAR LAMINECTOMY/DECOMPRESSION MICRODISCECTOMY 1 LEVEL L3-4 (N/A)  Anesthesia type: General  Patient location: PACU  Post pain: Pain level controlled  Post assessment: Post-op Vital signs reviewed  Last Vitals: BP 121/81  Pulse 72  Temp(Src) 36.4 C (Oral)  Resp 12  SpO2 99%  Post vital signs: Reviewed  Level of consciousness: sedated  Complications: No apparent anesthesia complications

## 2013-05-13 NOTE — Interval H&P Note (Signed)
History and Physical Interval Note:  05/13/2013 7:29 AM  Taylor Mcdonald  has presented today for surgery, with the diagnosis of HNP STENOSIS L3-4  The various methods of treatment have been discussed with the patient and family. After consideration of risks, benefits and other options for treatment, the patient has consented to  Procedure(s): LUMBAR LAMINECTOMY/DECOMPRESSION MICRODISCECTOMY 1 LEVEL L3-4 (N/A) as a surgical intervention .  The patient's history has been reviewed, patient examined, no change in status, stable for surgery.  I have reviewed the patient's chart and labs.  Questions were answered to the patient's satisfaction.     Dorthy Hustead C

## 2013-05-13 NOTE — Transfer of Care (Signed)
Immediate Anesthesia Transfer of Care Note  Patient: Taylor Mcdonald  Procedure(s) Performed: Procedure(s): LUMBAR LAMINECTOMY/DECOMPRESSION MICRODISCECTOMY 1 LEVEL L3-4 (N/A)  Patient Location: PACU  Anesthesia Type:General  Level of Consciousness: sedated  Airway & Oxygen Therapy: Patient Spontanous Breathing and Patient connected to face mask oxygen  Post-op Assessment: Report given to PACU RN and Post -op Vital signs reviewed and stable  Post vital signs: Reviewed and stable  Complications: No apparent anesthesia complications

## 2013-05-13 NOTE — Brief Op Note (Signed)
05/13/2013  10:59 AM  PATIENT:  Taylor Mcdonald  57 y.o. male  PRE-OPERATIVE DIAGNOSIS:  HNP STENOSIS L3-4  POST-OPERATIVE DIAGNOSIS:  HNP STENOSIS L3-4  PROCEDURE:  Procedure(s): LUMBAR LAMINECTOMY/DECOMPRESSION MICRODISCECTOMY 1 LEVEL L3-4 (N/A)  SURGEON:  Surgeon(s) and Role:    * Javier Docker, MD - Primary  PHYSICIAN ASSISTANT:   ASSISTANTS: Bissell   ANESTHESIA:   general  EBL:  Total I/O In: 1000 [I.V.:1000] Out: 150 [Blood:150]  BLOOD ADMINISTERED:none  DRAINS: none   LOCAL MEDICATIONS USED:  MARCAINE     SPECIMEN:  Source of Specimen:  [id number]  DISPOSITION OF SPECIMEN:  PATHOLOGY  COUNTS:  YES  TOURNIQUET:  * No tourniquets in log *  DICTATION: .Other Dictation: Dictation Number [id number]  PLAN OF CARE: Admit for overnight observation  PATIENT DISPOSITION:  PACU - hemodynamically stable.   Delay start of Pharmacological VTE agent (>24hrs) due to surgical blood loss or risk of bleeding: yes

## 2013-05-13 NOTE — Evaluation (Signed)
Physical Therapy Evaluation Patient Details Name: Taylor Mcdonald MRN: [id number] DOB: [birth year] Today's Date: 05/13/2013 Time: 4696-2952 PT Time Calculation (min): 27 min  PT Assessment / Plan / Recommendation History of Present Illness     Clinical Impression  Pt s/p lumbar laminectomy/microdiskectomy presents with functional mobility limitations 2* post op pain and back precautions.  Pt should progress well to d/c home with family assist.    PT Assessment  Patient needs continued PT services    Follow Up Recommendations  No PT follow up    Does the patient have the potential to tolerate intense rehabilitation      Barriers to Discharge        Equipment Recommendations  None recommended by PT    Recommendations for Other Services OT consult   Frequency 7X/week    Precautions / Restrictions Precautions Precautions: Back Precaution Booklet Issued: Yes (comment) Precaution Comments: all precautions reviewed Restrictions Weight Bearing Restrictions: No   Pertinent Vitals/Pain 4/10; premed      Mobility  Bed Mobility Bed Mobility: Supine to Sit Supine to Sit: 4: Min assist Details for Bed Mobility Assistance: cues for correct log roll technique Transfers Transfers: Sit to Stand;Stand to Sit Sit to Stand: 4: Min assist Stand to Sit: 4: Min assist Details for Transfer Assistance: cues for transition position, use of UEs and adherence to back precautions Ambulation/Gait Ambulation/Gait Assistance: 4: Min assist Ambulation Distance (Feet): 160 Feet (and 25) Assistive device: Rolling walker Ambulation/Gait Assistance Details: cues for posture and position from RW and stride length Gait Pattern: Step-through pattern;Shuffle;Trunk flexed    Exercises     PT Diagnosis: Difficulty walking  PT Problem List: Decreased strength;Decreased activity tolerance;Decreased mobility;Decreased knowledge of use of DME;Pain PT Treatment Interventions: DME instruction;Gait  training;Stair training;Functional mobility training;Therapeutic activities;Patient/family education     PT Goals(Current goals can be found in the care plan section) Acute Rehab PT Goals Patient Stated Goal: resume previous lifestyle with decreased pain PT Goal Formulation: With patient Time For Goal Achievement: 05/17/13 Potential to Achieve Goals: Good  Visit Information  Last PT Received On: 05/13/13 Assistance Needed: +1       Prior Functioning  Home Living Family/patient expects to be discharged to:: Private residence Living Arrangements: Spouse/significant other Available Help at Discharge: Family Type of Home: House Home Access: Stairs to enter Secretary/administrator of Steps: 5 Entrance Stairs-Rails: Right;Left Home Layout: One level Home Equipment: Environmental consultant - 2 wheels;Bedside commode Prior Function Level of Independence: Independent Communication Communication: No difficulties    Cognition  Cognition Arousal/Alertness: Awake/alert Behavior During Therapy: WFL for tasks assessed/performed Overall Cognitive Status: Within Functional Limits for tasks assessed    Extremity/Trunk Assessment Upper Extremity Assessment Upper Extremity Assessment: Overall WFL for tasks assessed Lower Extremity Assessment Lower Extremity Assessment: Overall WFL for tasks assessed (Pt with gout L foot)   Balance    End of Session PT - End of Session Activity Tolerance: Patient tolerated treatment well Patient left: in chair;with call bell/phone within reach;with family/visitor present Nurse Communication: Mobility status  GP Functional Assessment Tool Used: clinical judgement Functional Limitation: Mobility: Walking and moving around Mobility: Walking and Moving Around Current Status (W4132): At least 20 percent but less than 40 percent impaired, limited or restricted Mobility: Walking and Moving Around Goal Status [phone]): At least 1 percent but less than 20 percent impaired, limited  or restricted   Taylor Mcdonald 05/13/2013, 5:19 PM

## 2013-05-14 ENCOUNTER — Encounter (HOSPITAL_COMMUNITY): Payer: Self-pay | Admitting: Specialist

## 2013-05-14 MED ORDER — PREDNISONE (PAK) 5 MG PO TABS: 5.0000 mg | ORAL_TABLET | ORAL | Status: DC

## 2013-05-14 MED ORDER — ONDANSETRON HCL 4 MG PO TABS: 8.0000 mg | ORAL_TABLET | Freq: Three times a day (TID) | ORAL | Status: DC | PRN

## 2013-05-14 MED ORDER — ASPIRIN 81 MG PO TABS: 81.0000 mg | ORAL_TABLET | Freq: Every day | ORAL | Status: AC

## 2013-05-14 NOTE — Discharge Summary (Signed)
Physician Discharge Summary   Patient ID: Taylor Mcdonald MRN: [id number] DOB/AGE: May 27, 1956 57 y.o.  Admit date: 05/13/2013 Discharge date: 05/14/2013  Primary Diagnosis:   HNP STENOSIS L3-4  Admission Diagnoses:  Past Medical History  Diagnosis Date  . Gout   . Hypertension   . Low testosterone   . Migraine   . Allergy   . RLS (restless legs syndrome)   . Insomnia   . Hyperlipidemia   . GERD (gastroesophageal reflux disease)   . Hyperglycemia   . Fatigue   . Pain of left heel 10/01/2011  . Vitamin D deficiency 12/23/2011  . Sinusitis 03/04/2012  . Anxiety 03/04/2012  . Preventative health care 07/24/2012  . Lower back pain 09/09/2012  . Vasovagal near syncope 04/30/2013   Discharge Diagnoses:   Principal Problem:   Spinal stenosis, lumbar region, with neurogenic claudication  Procedure:  Procedure(s) (LRB): LUMBAR LAMINECTOMY/DECOMPRESSION MICRODISCECTOMY 1 LEVEL L3-4 (N/A)   Consults: None  HPI:  see H&P    Laboratory Data: Hospital Outpatient Visit on 05/10/2013  Component Date Value Range Status  . Sodium 05/10/2013 136  135 - 145 mEq/L Final  . Potassium 05/10/2013 4.5  3.5 - 5.1 mEq/L Final  . Chloride 05/10/2013 103  96 - 112 mEq/L Final  . CO2 05/10/2013 27  19 - 32 mEq/L Final  . Glucose, Bld 05/10/2013 97  70 - 99 mg/dL Final  . BUN 40/98/1191 11  6 - 23 mg/dL Final  . Creatinine, Ser 05/10/2013 0.99  0.50 - 1.35 mg/dL Final  . Calcium 47/82/9562 9.5  8.4 - 10.5 mg/dL Final  . GFR calc non Af Amer 05/10/2013 88* >90 mL/min Final  . GFR calc Af Amer 05/10/2013 >90  >90 mL/min Final   Comment: (NOTE)                          The eGFR has been calculated using the CKD EPI equation.                          This calculation has not been validated in all clinical situations.                          eGFR's persistently <90 mL/min signify possible Chronic Kidney                          Disease.  . WBC 05/10/2013 11.4* 4.0 - 10.5 K/uL Final  . RBC  05/10/2013 5.10  4.22 - 5.81 MIL/uL Final  . Hemoglobin 05/10/2013 15.1  13.0 - 17.0 g/dL Final  . HCT 13/02/6577 44.8  39.0 - 52.0 % Final  . MCV 05/10/2013 87.8  78.0 - 100.0 fL Final  . MCH 05/10/2013 29.6  26.0 - 34.0 pg Final  . MCHC 05/10/2013 33.7  30.0 - 36.0 g/dL Final  . RDW 46/96/2952 13.8  11.5 - 15.5 % Final  . Platelets 05/10/2013 259  150 - 400 K/uL Final  . MRSA, PCR 05/10/2013 NEGATIVE  NEGATIVE Final  . Staphylococcus aureus 05/10/2013 POSITIVE* NEGATIVE Final   Comment:                                 The Xpert SA Assay (FDA  approved for NASAL specimens                          in patients over 98 years of age),                          is one component of                          a comprehensive surveillance                          program.  Test performance has                          been validated by [employer] for patients greater                          than or equal to 22 year old.                          It is not intended                          to diagnose infection nor to                          guide or monitor treatment.   No results found for this basename: HGB,  in the last 72 hours No results found for this basename: WBC, RBC, HCT, PLT,  in the last 72 hours No results found for this basename: NA, K, CL, CO2, BUN, CREATININE, GLUCOSE, CALCIUM,  in the last 72 hours No results found for this basename: LABPT, INR,  in the last 72 hours  X-Rays:Dg Lumbar Spine 2-3 Views  05/10/2013   CLINICAL DATA:  Preop lumbar surgery  EXAM: LUMBAR SPINE - 2-3 VIEW  COMPARISON:  None.  FINDINGS: The lumbar spine levels were annotated on both the frontal and lateral projection radiographs. There is no evidence of lumbar spine fracture. Alignment is normal. There is multi level disc space narrowing and ventral endplate spurring identified throughout the lumbar spine. No fractures are noted.  IMPRESSION: 1.  Multi  level lumbar degenerative disc disease.   Electronically Signed   By: Signa Kell M.D.   On: 05/10/2013 11:29   Dg Spine Portable 1 View  05/13/2013   CLINICAL DATA:  L3-4 surgical level.  EXAM: PORTABLE SPINE - 1 VIEW  COMPARISON:  05/13/2013 intraoperative EXAM. 05/10/2013 preoperative exam. No comparison MR or postmyelogram CT.  FINDINGS: Based on the numbering sequence of 05/10/2013, last fully open disc space is labeled L5-S1.  There are 3 metallic probes in place. Tips are in the region of the L3-4 neural foramen, posterior to the upper L4 vertebral body and at the upper to mid L4 pedicle level. Metallic spread is in place posterior to the L3-4 level.  Vascular calcifications.  Recommend correlation with any outside exams to confirm this level assignment.  IMPRESSION: There are 3 metallic probes in place. Tips are in the region of the L3-4 neural  foramen, posterior to the upper L4 vertebral body and at the upper to mid L4 pedicle level.  Please see above.  This is a call report.   Electronically Signed   By: Bridgett Larsson M.D.   On: 05/13/2013 10:13   Dg Spine Portable 1 View  05/13/2013   CLINICAL DATA:  L3-4 decompression.  EXAM: PORTABLE SPINE - 1 VIEW  COMPARISON:  05/13/2013  FINDINGS: Posterior surgical instruments are directed at the L3-4 level.  IMPRESSION: Intraoperative localization as above.   Electronically Signed   By: Charlett Nose M.D.   On: 05/13/2013 08:39   Dg Spine Portable 1 View  05/13/2013   CLINICAL DATA:  Decompression L3-4.  EXAM: PORTABLE SPINE - 1 VIEW  COMPARISON:  05/10/2013  FINDINGS: Posterior needles are directed at the L2-3 and L3-4 interspaces.  IMPRESSION: Intraoperative localization as above.   Electronically Signed   By: Charlett Nose M.D.   On: 05/13/2013 08:35    EKG: Orders placed during the hospital encounter of 04/09/13  . ED EKG  . ED EKG  . EKG 12-LEAD  . EKG 12-LEAD  . EKG     Hospital Course: Patient was admitted to [facility] and  taken to the OR and underwent the above state procedure without complications.  Patient tolerated the procedure well and was later transferred to the recovery room and then to the orthopaedic floor for postoperative care.  They were given PO and IV analgesics for pain control following their surgery.  They were given 24 hours of postoperative antibiotics.   PT was consulted postop to assist with mobility and transfers.  The patient was allowed to be WBAT with therapy and was taught back precautions. Discharge planning was consulted to help with postop disposition and equipment needs.  Patient had a good night on the evening of surgery and started to get up OOB with therapy on day one. Patient was seen in rounds and was ready to go home on day one.  They were given discharge instructions and dressing directions.  They were instructed on when to follow up in the office with Dr. Shelle Iron.  Discharge Medications: Prior to Admission medications   Medication Sig Start Date End Date Taking? Authorizing Provider  allopurinol (ZYLOPRIM) 100 MG tablet Take 100 mg by mouth daily.   Yes Historical Provider, MD  atorvastatin (LIPITOR) 10 MG tablet Take 10 mg by mouth every evening.   Yes Historical Provider, MD  colchicine 0.6 MG tablet Take 1 tablet (0.6 mg total) by mouth daily. 05/10/13  Yes Bradd Canary, MD  Fish Oil-Cholecalciferol (FISH OIL + D3) 1200-1000 MG-UNIT CAPS Take 1 capsule by mouth daily.   Yes Historical Provider, MD  fluticasone (FLONASE) 50 MCG/ACT nasal spray Place 2 sprays into the nose daily.   Yes Historical Provider, MD  Lutein 10 MG TABS Take 1 tablet by mouth daily.   Yes Historical Provider, MD  omeprazole (PRILOSEC) 20 MG capsule Take 20 mg by mouth daily.   Yes Historical Provider, MD  propranolol (INDERAL) 40 MG tablet Take 40 mg by mouth every morning.   Yes Historical Provider, MD  aspirin 81 MG tablet Take 1 tablet (81 mg total) by mouth daily. Resume 4 days post-op 05/14/13   Dayna Barker. Kenedee Molesky, PA-C  calcium-vitamin D (OSCAL WITH D) 500-200 MG-UNIT per tablet Take 1 tablet by mouth daily with breakfast.    Historical Provider, MD  fexofenadine-pseudoephedrine (ALLEGRA-D) 60-120 MG per tablet Take 1 tablet by  mouth 2 (two) times daily as needed (allergies).    Historical Provider, MD  lisinopril (PRINIVIL,ZESTRIL) 20 MG tablet Take 20 mg by mouth every evening.    Historical Provider, MD  methocarbamol (ROBAXIN) 500 MG tablet Take 1 tablet (500 mg total) by mouth 3 (three) times daily between meals as needed. 05/13/13   Javier Docker, MD  oxyCODONE-acetaminophen (PERCOCET) 5-325 MG per tablet Take 1-2 tablets by mouth every 4 (four) hours as needed for pain. 05/13/13   Javier Docker, MD  predniSONE (STERAPRED UNI-PAK) 5 MG TABS tablet Take 1 tablet (5 mg total) by mouth as directed. 6 day, 5mg  tab prednisone pak 05/14/13   Dayna Barker. Aahna Rossa, PA-C  promethazine (PHENERGAN) 25 MG suppository Place 25 mg rectally every 6 (six) hours as needed for nausea.    Historical Provider, MD  testosterone cypionate (DEPOTESTOTERONE CYPIONATE) 200 MG/ML injection Inject 200 mg into the muscle every 14 (fourteen) days.    Historical Provider, MD    Diet: Regular diet Activity:WBAT Follow-up:in 10-14 days Disposition - Home Discharged Condition: good   Discharge Orders   Future Appointments Provider Department Dept Phone   08/03/2013 9:00 AM Bradd Canary, MD [facility] at  [facility] [phone]   Future Orders Complete By Expires   Call MD / Call 911  As directed    Comments:     If you experience chest pain or shortness of breath, CALL 911 and be transported to the hospital emergency room.  If you develope a fever above 101 F, pus (white drainage) or increased drainage or redness at the wound, or calf pain, call your surgeon's office.   Constipation Prevention  As directed    Comments:     Drink plenty of fluids.  Prune juice may be helpful.  You may use a stool  softener, such as Colace (over the counter) 100 mg twice a day.  Use MiraLax (over the counter) for constipation as needed.   Diet - low sodium heart healthy  As directed    Increase activity slowly as tolerated  As directed        Medication List    STOP taking these medications       HYDROcodone-acetaminophen 5-325 MG per tablet  Commonly known as:  NORCO/VICODIN      TAKE these medications       allopurinol 100 MG tablet  Commonly known as:  ZYLOPRIM  Take 100 mg by mouth daily.     aspirin 81 MG tablet  Take 1 tablet (81 mg total) by mouth daily. Resume 4 days post-op     atorvastatin 10 MG tablet  Commonly known as:  LIPITOR  Take 10 mg by mouth every evening.     calcium-vitamin D 500-200 MG-UNIT per tablet  Commonly known as:  OSCAL WITH D  Take 1 tablet by mouth daily with breakfast.     colchicine 0.6 MG tablet  Take 1 tablet (0.6 mg total) by mouth daily.     fexofenadine-pseudoephedrine 60-120 MG per tablet  Commonly known as:  ALLEGRA-D  Take 1 tablet by mouth 2 (two) times daily as needed (allergies).     FISH OIL + D3 1200-1000 MG-UNIT Caps  Take 1 capsule by mouth daily.     fluticasone 50 MCG/ACT nasal spray  Commonly known as:  FLONASE  Place 2 sprays into the nose daily.     lisinopril 20 MG tablet  Commonly known as:  PRINIVIL,ZESTRIL  Take 20 mg by  mouth every evening.     Lutein 10 MG Tabs  Take 1 tablet by mouth daily.     methocarbamol 500 MG tablet  Commonly known as:  ROBAXIN  Take 1 tablet (500 mg total) by mouth 3 (three) times daily between meals as needed.     omeprazole 20 MG capsule  Commonly known as:  PRILOSEC  Take 20 mg by mouth daily.     oxyCODONE-acetaminophen 5-325 MG per tablet  Commonly known as:  PERCOCET  Take 1-2 tablets by mouth every 4 (four) hours as needed for pain.     predniSONE 5 MG Tabs tablet  Commonly known as:  STERAPRED UNI-PAK  Take 1 tablet (5 mg total) by mouth as directed. 6 day, 5mg  tab  prednisone pak     promethazine 25 MG suppository  Commonly known as:  PHENERGAN  Place 25 mg rectally every 6 (six) hours as needed for nausea.     propranolol 40 MG tablet  Commonly known as:  INDERAL  Take 40 mg by mouth every morning.     testosterone cypionate 200 MG/ML injection  Commonly known as:  DEPOTESTOTERONE CYPIONATE  Inject 200 mg into the muscle every 14 (fourteen) days.           Follow-up Information   Follow up with BEANE,JEFFREY C, MD In 2 weeks.   Specialty:  Orthopedic Surgery   Contact information:   [address] [place] [address] [phone]       Signed: Dorothy Spark. 05/14/2013, 6:09 PM

## 2013-05-14 NOTE — Progress Notes (Signed)
Physical Therapy Treatment Patient Details Name: Taylor Mcdonald MRN: [id number] DOB: 07/24/1956 Today's Date: 05/14/2013 Time: 4782-9562 PT Time Calculation (min): 35 min  PT Assessment / Plan / Recommendation  History of Present Illness     PT Comments     Follow Up Recommendations  No PT follow up     Does the patient have the potential to tolerate intense rehabilitation     Barriers to Discharge        Equipment Recommendations  None recommended by PT    Recommendations for Other Services OT consult  Frequency 7X/week   Progress towards PT Goals Progress towards PT goals: Progressing toward goals  Plan Current plan remains appropriate    Precautions / Restrictions Precautions Precautions: Back Precaution Booklet Issued: Yes (comment) Precaution Comments: all precautions reviewed Restrictions Weight Bearing Restrictions: No   Pertinent Vitals/Pain 6/10; premed    Mobility  Bed Mobility Bed Mobility: Sit to Supine;Supine to Sit Supine to Sit: 4: Min guard;5: Supervision Sit to Supine: 4: Min guard;5: Supervision Details for Bed Mobility Assistance: cues for correct log roll technique and assist to keep hips in line with shoulders Transfers Transfers: Sit to Stand;Stand to Sit Sit to Stand: 4: Min guard;From chair/3-in-1;From bed;With upper extremity assist Stand to Sit: 4: Min guard;To chair/3-in-1;To bed Details for Transfer Assistance: cues for transition position and use of UEs to self assist Ambulation/Gait Ambulation/Gait Assistance: 4: Min guard;5: Supervision Ambulation Distance (Feet): 400 Feet Assistive device: Rolling walker Ambulation/Gait Assistance Details: cues for posture and position from RW Gait Pattern: Step-through pattern;Shuffle;Trunk flexed Gait velocity: decr Stairs: Yes Stairs Assistance: 4: Min assist Stairs Assistance Details (indicate cue type and reason): cues for sequence Stair Management Technique: One rail Right;Step to  pattern;Forwards Number of Stairs: 4    Exercises     PT Diagnosis:    PT Problem List:   PT Treatment Interventions:     PT Goals (current goals can now be found in the care plan section) Acute Rehab PT Goals Patient Stated Goal: resume previous lifestyle with decreased pain PT Goal Formulation: With patient Time For Goal Achievement: 05/17/13 Potential to Achieve Goals: Good  Visit Information  Last PT Received On: 05/14/13 Assistance Needed: +1    Subjective Data  Subjective: Its a lot more sore than yesterday Patient Stated Goal: resume previous lifestyle with decreased pain   Cognition  Cognition Arousal/Alertness: Awake/alert Behavior During Therapy: WFL for tasks assessed/performed Overall Cognitive Status: Within Functional Limits for tasks assessed    Balance     End of Session PT - End of Session Activity Tolerance: Patient tolerated treatment well Patient left: in chair;with call bell/phone within reach;with family/visitor present Nurse Communication: Mobility status   GP     Taylor Mcdonald 05/14/2013, 12:00 PM

## 2013-05-14 NOTE — Evaluation (Signed)
Occupational Therapy Evaluation Patient Details Name: Taylor Mcdonald MRN: [id number] DOB: 12-19-1955 Today's Date: 05/14/2013 Time: 8657-8469 OT Time Calculation (min): 35 min  OT Assessment / Plan / Recommendation History of present illness     Clinical Impression   Pt was admited for L2-3 decompression.  All education was completed.  Pt does not need any further OT at this time.       OT Assessment  Patient does not need any further OT services    Follow Up Recommendations  No OT follow up;Supervision/Assistance - 24 hour (initial 24/7)    Barriers to Discharge      Equipment Recommendations  None recommended by OT    Recommendations for Other Services    Frequency       Precautions / Restrictions Precautions Precautions: Back Precaution Comments: all precautions reviewed Restrictions Weight Bearing Restrictions: No   Pertinent Vitals/Pain 8 back, initially, 6/10 after walking.  Pt was premedicated.  Repositioned in chair    ADL  Toilet Transfer: Minimal assistance (for sit to stand) Toilet Transfer Method: Sit to stand Toilet Transfer Equipment: Raised toilet seat with arms (or 3-in-1 over toilet) Toileting - Clothing Manipulation and Hygiene: Supervision/safety Where Assessed - Toileting Clothing Manipulation and Hygiene: Standing Tub/Shower Transfer: Min guard Tub/Shower Transfer Method: Science writer: Walk in Scientist, research (physical sciences) Used: Rolling walker Transfers/Ambulation Related to ADLs: ambulated to bathroom with min guard.  Pt had one LOB when he was backing up to sit in chair; no LOB when walking ADL Comments: Reviewed safe methods for donning LB clothing:  wife will also assist.  Educated on toilet aid.  Pt was limited by pain at beginning of session:  if he wants to don pants himself, lying flat and bringing legs up will be best method for him.  At time of eval: max A for LB adls and supervision for UB    OT Diagnosis:    OT  Problem List:   OT Treatment Interventions:     OT Goals(Current goals can be found in the care plan section)    Visit Information  Last OT Received On: 05/14/13 Assistance Needed: +1       Prior Functioning     Home Living Family/patient expects to be discharged to:: Private residence Living Arrangements: Spouse/significant other Home Equipment: Environmental consultant - 2 wheels;Bedside commode Prior Function Level of Independence: Independent Communication Communication: No difficulties         Vision/Perception     Cognition  Cognition Behavior During Therapy: WFL for tasks assessed/performed Overall Cognitive Status: Within Functional Limits for tasks assessed    Extremity/Trunk Assessment Upper Extremity Assessment Upper Extremity Assessment: Overall WFL for tasks assessed     Mobility Bed Mobility Supine to Sit: 4: Min assist Details for Bed Mobility Assistance: cues for correct log roll technique and assist to keep hips in line with shoulders Transfers Sit to Stand: 4: Min assist;From bed;From chair/3-in-1;With armrests;With upper extremity assist Stand to Sit: 4: Min guard Details for Transfer Assistance: cues for technique.  Min lifting assistance     Exercise     Balance     End of Session OT - End of Session Activity Tolerance: Patient tolerated treatment well Patient left: in chair;with call bell/phone within reach;with family/visitor present  GO Functional Assessment Tool Used: clinical observation/judgment Functional Limitation: Self care Self Care Current Status (G2952): At least 60 percent but less than 80 percent impaired, limited or restricted Self Care Goal Status (W4132): At least 60 percent  but less than 80 percent impaired, limited or restricted Self Care Discharge Status [phone]): At least 60 percent but less than 80 percent impaired, limited or restricted   Braxxton,Daune Divirgilio 05/14/2013, 9:09 AM Marica Otter, OTR/L [phone] 05/14/2013

## 2013-05-14 NOTE — Progress Notes (Signed)
Subjective: 1 Day Post-Op Procedure(s) (LRB): LUMBAR LAMINECTOMY/DECOMPRESSION MICRODISCECTOMY 1 LEVEL L3-4 (N/A) Patient reports pain as moderate.  Reports moderate incisional lower back pain. Denies leg pain, numbness, tingling. Does report increased back pain with movement. Has been OOB to bathroom. Voiding without difficulty. Would like to try to go home today.  Objective: Vital signs in last 24 hours: Temp:  [96.7 F (35.9 C)-98.8 F (37.1 C)] 98.8 F (37.1 C) (10/17 0611) Pulse Rate:  [72-91] 91 (10/17 0611) Resp:  [8-18] 16 (10/17 0611) BP: (98-129)/(62-81) 119/72 mmHg (10/17 0611) SpO2:  [98 %-100 %] 98 % (10/17 0611) Weight:  [86.183 kg (190 lb)] 86.183 kg (190 lb) (10/16 1301)  Intake/Output from previous day: 10/16 0701 - 10/17 0700 In: 4515 [P.O.:1080; I.V.:3280; IV Piggyback:155] Out: 1950 [Urine:1800; Blood:150] Intake/Output this shift:    No results found for this basename: HGB,  in the last 72 hours No results found for this basename: WBC, RBC, HCT, PLT,  in the last 72 hours No results found for this basename: NA, K, CL, CO2, BUN, CREATININE, GLUCOSE, CALCIUM,  in the last 72 hours No results found for this basename: LABPT, INR,  in the last 72 hours  Neurologically intact ABD soft Neurovascular intact Sensation intact distally Intact pulses distally Dorsiflexion/Plantar flexion intact Incision: dressing C/D/I and no drainage No cellulitis present Compartment soft no calf pain or sign of DVT  Assessment/Plan: 1 Day Post-Op Procedure(s) (LRB): LUMBAR LAMINECTOMY/DECOMPRESSION MICRODISCECTOMY 1 LEVEL L3-4 (N/A) Advance diet Up with therapy D/C IV fluids Discussed D/C instructions, Lspine precautions Possible D/C today if tolerates PT well and pain well controlled on PO meds, otherwise D/C tomorrow Will discuss with Dr. Elissa Lovett, Marga Gramajo M. 05/14/2013, 7:35 AM

## 2013-05-14 NOTE — Care Management Note (Signed)
    Page 1 of 1   05/14/2013     2:48:57 PM   CARE MANAGEMENT NOTE 05/14/2013  Patient:  Taylor Mcdonald, Taylor Mcdonald   Account Number:  [id number]  Date Initiated:  05/14/2013  Documentation initiated by:  Colleen Can  Subjective/Objective Assessment:   dx HNP stenosis L3-4; lumbar laminectomy/decompression microdissectomy 1 level     Action/Plan:   CM spoke with patient and spouse. Plans are for patient to return to his home where spouse will be caregiver. He already has DME. There are no HH needs.   Anticipated DC Date:  05/14/2013   Anticipated DC Plan:  HOME/SELF CARE      DC Planning Services  CM consult      Choice offered to / List presented to:             Status of service:  Completed, signed off Medicare Important Message given?   (If response is "NO", the following Medicare IM given date fields will be blank) Date Medicare IM given:   Date Additional Medicare IM given:    Discharge Disposition:  HOME/SELF CARE  Per UR Regulation:    If discussed at Long Length of Stay Meetings, dates discussed:    Comments:

## 2013-05-18 ENCOUNTER — Ambulatory Visit: Payer: BC Managed Care – PPO | Admitting: Family Medicine

## 2013-06-03 ENCOUNTER — Other Ambulatory Visit: Payer: Self-pay

## 2013-08-03 ENCOUNTER — Ambulatory Visit: Payer: BC Managed Care – PPO | Admitting: Family Medicine

## 2013-08-13 ENCOUNTER — Ambulatory Visit: Payer: BC Managed Care – PPO | Admitting: Family Medicine

## 2013-08-17 ENCOUNTER — Ambulatory Visit: Payer: BC Managed Care – PPO | Admitting: Family Medicine

## 2013-08-18 ENCOUNTER — Ambulatory Visit (INDEPENDENT_AMBULATORY_CARE_PROVIDER_SITE_OTHER): Payer: BC Managed Care – PPO | Admitting: Family Medicine

## 2013-08-18 ENCOUNTER — Encounter: Payer: Self-pay | Admitting: Family Medicine

## 2013-08-18 ENCOUNTER — Other Ambulatory Visit: Payer: Self-pay | Admitting: Family Medicine

## 2013-08-18 VITALS — BP 108/82 | HR 60 | Temp 97.8°F | Ht 72.5 in | Wt 203.0 lb

## 2013-08-18 DIAGNOSIS — E785 Hyperlipidemia, unspecified: Secondary | ICD-10-CM

## 2013-08-18 DIAGNOSIS — E291 Testicular hypofunction: Secondary | ICD-10-CM

## 2013-08-18 DIAGNOSIS — K219 Gastro-esophageal reflux disease without esophagitis: Secondary | ICD-10-CM | POA: Diagnosis not present

## 2013-08-18 DIAGNOSIS — G43909 Migraine, unspecified, not intractable, without status migrainosus: Secondary | ICD-10-CM

## 2013-08-18 DIAGNOSIS — R7309 Other abnormal glucose: Secondary | ICD-10-CM

## 2013-08-18 DIAGNOSIS — M109 Gout, unspecified: Secondary | ICD-10-CM | POA: Diagnosis not present

## 2013-08-18 DIAGNOSIS — Z23 Encounter for immunization: Secondary | ICD-10-CM

## 2013-08-18 DIAGNOSIS — R739 Hyperglycemia, unspecified: Secondary | ICD-10-CM

## 2013-08-18 DIAGNOSIS — E349 Endocrine disorder, unspecified: Secondary | ICD-10-CM

## 2013-08-18 DIAGNOSIS — G4700 Insomnia, unspecified: Secondary | ICD-10-CM

## 2013-08-18 DIAGNOSIS — F419 Anxiety disorder, unspecified: Secondary | ICD-10-CM

## 2013-08-18 DIAGNOSIS — I10 Essential (primary) hypertension: Secondary | ICD-10-CM | POA: Diagnosis not present

## 2013-08-18 DIAGNOSIS — M545 Low back pain, unspecified: Secondary | ICD-10-CM

## 2013-08-18 DIAGNOSIS — F411 Generalized anxiety disorder: Secondary | ICD-10-CM

## 2013-08-18 DIAGNOSIS — R7989 Other specified abnormal findings of blood chemistry: Secondary | ICD-10-CM

## 2013-08-18 LAB — CBC
HCT: 44.5 % (ref 39.0–52.0)
HEMOGLOBIN: 14.7 g/dL (ref 13.0–17.0)
MCH: 28.5 pg (ref 26.0–34.0)
MCHC: 33 g/dL (ref 30.0–36.0)
MCV: 86.2 fL (ref 78.0–100.0)
PLATELETS: 304 10*3/uL (ref 150–400)
RBC: 5.16 MIL/uL (ref 4.22–5.81)
RDW: 14.1 % (ref 11.5–15.5)
WBC: 8.8 10*3/uL (ref 4.0–10.5)

## 2013-08-18 MED ORDER — DIAZEPAM 5 MG PO TABS: 5.0000 mg | ORAL_TABLET | Freq: Two times a day (BID) | ORAL | Status: DC | PRN

## 2013-08-18 MED ORDER — TESTOSTERONE CYPIONATE 200 MG/ML IM SOLN: 200.0000 mg | INTRAMUSCULAR | Status: DC

## 2013-08-18 MED ORDER — OMEPRAZOLE 20 MG PO CPDR: 20.0000 mg | DELAYED_RELEASE_CAPSULE | Freq: Every day | ORAL | Status: DC

## 2013-08-18 MED ORDER — HYDROCODONE-HOMATROPINE 5-1.5 MG/5ML PO SYRP: 5.0000 mL | ORAL_SOLUTION | Freq: Three times a day (TID) | ORAL | Status: DC | PRN

## 2013-08-18 MED ORDER — ALLOPURINOL 100 MG PO TABS: 200.0000 mg | ORAL_TABLET | Freq: Every day | ORAL | Status: DC

## 2013-08-18 MED ORDER — METHYLPHENIDATE HCL 10 MG PO TABS: 10.0000 mg | ORAL_TABLET | Freq: Every day | ORAL | Status: DC | PRN

## 2013-08-18 MED ORDER — ONDANSETRON HCL 4 MG PO TABS: 4.0000 mg | ORAL_TABLET | Freq: Three times a day (TID) | ORAL | Status: DC | PRN

## 2013-08-18 NOTE — Progress Notes (Signed)
Subjective:     Patient ID: Taylor Mcdonald, male   DOB: September 04, 1955, 58 y.o.   MRN: [id number]  CC: 3 mo f/u HTN, Hyperlipidemia, documented hypothyroidism and hyperglycemia, 3 mo. s/p lumbar laminectomy  HPI No sciatica or residual back pain s/p surgery. Pt states his lumbar laminectomy 04/2013 went well and was released from surgeon's services in December 2014. Pt reports feeling well since last visit 3 mo ago. Reports occasional headaches with nausea and lightheadedness if standing up too quickly. His headaches are controlled with medications.  Pt has a current flare-up of Gout on left hand which was triggered by eating steak. He woke up last night with pain and has tried wearing a brace on/off for relief. He has gout flares q 3 months and is wondering is he can go higher on his Allopurinol dose. Pt requests refills on several medications  (Ritalin,  Zofran for nausea with headaches, Omeprazole, Testosterone cypionate, Hydromet syrup for Headache, Diazepam for sleep q 1-2 months.)   Review of Systems  Constitutional: Negative for fever, chills, activity change and fatigue.  Respiratory: Negative for shortness of breath.   Cardiovascular: Negative for chest pain and palpitations.  Gastrointestinal: Positive for nausea. Negative for vomiting and abdominal pain.       Occasional nausea with headaches, controlled with Zofran  Musculoskeletal: Negative for arthralgias, back pain and myalgias.  Neurological: Positive for headaches. Negative for syncope.       Occasional headaches 1x per month, controlled with Hydromet syrup   Current outpatient prescriptions:allopurinol (ZYLOPRIM) 100 MG tablet, Take 100 mg by mouth daily., Disp: , Rfl: ;  aspirin 81 MG tablet, Take 1 tablet (81 mg total) by mouth daily. Resume 4 days post-op, Disp: 30 tablet, Rfl: ;  atorvastatin (LIPITOR) 10 MG tablet, Take 10 mg by mouth every evening., Disp: , Rfl: ;  calcium-vitamin D (OSCAL WITH D) 500-200 MG-UNIT per tablet,  Take 1 tablet by mouth daily with breakfast., Disp: , Rfl:  colchicine 0.6 MG tablet, Take 1 tablet (0.6 mg total) by mouth daily., Disp: 30 tablet, Rfl: 1;  fexofenadine-pseudoephedrine (ALLEGRA-D) 60-120 MG per tablet, Take 1 tablet by mouth 2 (two) times daily as needed (allergies)., Disp: , Rfl: ;  Fish Oil-Cholecalciferol (FISH OIL + D3) 1200-1000 MG-UNIT CAPS, Take 1 capsule by mouth daily., Disp: , Rfl: ;  fluticasone (FLONASE) 50 MCG/ACT nasal spray, Place 2 sprays into the nose daily., Disp: , Rfl:  lisinopril (PRINIVIL,ZESTRIL) 20 MG tablet, Take 20 mg by mouth every evening., Disp: , Rfl: ;  Lutein 10 MG TABS, Take 1 tablet by mouth daily., Disp: , Rfl: ;  methocarbamol (ROBAXIN) 500 MG tablet, Take 1 tablet (500 mg total) by mouth 3 (three) times daily between meals as needed., Disp: 40 tablet, Rfl: 1;  omeprazole (PRILOSEC) 20 MG capsule, Take 20 mg by mouth daily., Disp: , Rfl:  oxyCODONE-acetaminophen (PERCOCET) 5-325 MG per tablet, Take 1-2 tablets by mouth every 4 (four) hours as needed for pain., Disp: 40 tablet, Rfl: 0;  promethazine (PHENERGAN) 25 MG suppository, Place 25 mg rectally every 6 (six) hours as needed for nausea., Disp: , Rfl: ;  propranolol (INDERAL) 40 MG tablet, Take 40 mg by mouth every morning., Disp: , Rfl:  testosterone cypionate (DEPOTESTOTERONE CYPIONATE) 200 MG/ML injection, Inject 200 mg into the muscle every 14 (fourteen) days., Disp: , Rfl:       Objective:   Physical Exam  Constitutional: He is oriented to person, place, and time. He appears well-developed  and well-nourished.  HENT:  Head: Normocephalic and atraumatic.  Cardiovascular: Normal rate, regular rhythm and normal heart sounds.   Pulmonary/Chest: Effort normal and breath sounds normal. No respiratory distress.  Musculoskeletal: He exhibits edema.  Left hand  Neurological: He is alert and oriented to person, place, and time.  Skin: Skin is warm and dry. There is erythema.  Erythema, left hand.   Psychiatric: He has a normal mood and affect. His behavior is normal. Judgment and thought content normal.    Filed Vitals:   08/18/13 0914  BP: 108/82  Pulse: 60  Temp: 97.8 F (36.6 C)   Lipid Panel     Component Value Date/Time   CHOL 201* 02/10/2013 0836   TRIG 130.0 02/10/2013 0836   HDL 49.50 02/10/2013 0836   CHOLHDL 4 02/10/2013 0836   VLDL 26.0 02/10/2013 0836   LDLCALC 103* 12/13/2011 0758   BP Readings from Last 3 Encounters:  08/18/13 108/82  05/14/13 119/72  05/14/13 119/72   Body mass index is 27.14 kg/(m^2).      Assessment:      1. Gout flare L hand.  2. Migraines controlled with Hydromet, Zofran, oxcodone-acetaminophen.      Plan:    1.  Increase Allopurinol to 200 mg (2 tabs per day)      Increase Colchicine for current flare 2 tabs of 0.6 mg, wait 2 hrs, repeat dose & waiting period up to 6 tabs max/24 hrs.       Obtain Uric Acid lvl today and again at next visit.      Follow-up 6 mo. 2. Refill Hydromet syrup for headaches.   3. Labs today including Uric Acid level, Testosterone level, BMP, lipid panel, TSH, CBC, renal panel.   4.  Pt Education: Pt understands medication adjustments and dietary recommendations for Gout flare.   08/18/2013   [place] Abel Hageman, PA-S.  Patient seen, interviewed and examined with student agree with documentation     Hypertension Well controlled, no changes  Insomnia Sleeping better  Testosterone deficiency Well treated.  Hyperlipidemia Well treated, no changes, avoid trans fats  Hyperglycemia Normal on labs, avoid simple carbs  Lower back pain Doing much better s/p surgical decompression, very rarely needing pain meds may continue infrequent use.   Anxiety Doing better now that he has retired, no change in meds  Migraine Uses Hycodan and Oxycodone infrequently to break headaches. Increase rest and hydration  Gout Recent flare in gout in left hand, increase Allopurinol and use Colchicine prn

## 2013-08-18 NOTE — Patient Instructions (Signed)
Colchicine 0.6 mg tabs, 2 tabs once then in 2 hours if still in pain repeat as needed, up to a max of 6 tabs in 24 hours or intolerable side effects  Increase hydration   Gout Gout is an inflammatory arthritis caused by a buildup of uric acid crystals in the joints. Uric acid is a chemical that is normally present in the blood. When the level of uric acid in the blood is too high it can form crystals that deposit in your joints and tissues. This causes joint redness, soreness, and swelling (inflammation). Repeat attacks are common. Over time, uric acid crystals can form into masses (tophi) near a joint, destroying bone and causing disfigurement. Gout is treatable and often preventable. CAUSES  The disease begins with elevated levels of uric acid in the blood. Uric acid is produced by your body when it breaks down a naturally found substance called purines. Certain foods you eat, such as meats and fish, contain high amounts of purines. Causes of an elevated uric acid level include:  Being passed down from parent to child (heredity).  Diseases that cause increased uric acid production (such as obesity, psoriasis, and certain cancers).  Excessive alcohol use.  Diet, especially diets rich in meat and seafood.  Medicines, including certain cancer-fighting medicines (chemotherapy), water pills (diuretics), and aspirin.  Chronic kidney disease. The kidneys are no longer able to remove uric acid well.  Problems with metabolism. Conditions strongly associated with gout include:  Obesity.  High blood pressure.  High cholesterol.  Diabetes. Not everyone with elevated uric acid levels gets gout. It is not understood why some people get gout and others do not. Surgery, joint injury, and eating too much of certain foods are some of the factors that can lead to gout attacks. SYMPTOMS   An attack of gout comes on quickly. It causes intense pain with redness, swelling, and warmth in a  joint.  Fever can occur.  Often, only one joint is involved. Certain joints are more commonly involved:  Base of the big toe.  Knee.  Ankle.  Wrist.  Finger. Without treatment, an attack usually goes away in a few days to weeks. Between attacks, you usually will not have symptoms, which is different from many other forms of arthritis. DIAGNOSIS  Your caregiver will suspect gout based on your symptoms and exam. In some cases, tests may be recommended. The tests may include:  Blood tests.  Urine tests.  X-rays.  Joint fluid exam. This exam requires a needle to remove fluid from the joint (arthrocentesis). Using a microscope, gout is confirmed when uric acid crystals are seen in the joint fluid. TREATMENT  There are two phases to gout treatment: treating the sudden onset (acute) attack and preventing attacks (prophylaxis).  Treatment of an Acute Attack.  Medicines are used. These include anti-inflammatory medicines or steroid medicines.  An injection of steroid medicine into the affected joint is sometimes necessary.  The painful joint is rested. Movement can worsen the arthritis.  You may use warm or cold treatments on painful joints, depending which works best for you.  Treatment to Prevent Attacks.  If you suffer from frequent gout attacks, your caregiver may advise preventive medicine. These medicines are started after the acute attack subsides. These medicines either help your kidneys eliminate uric acid from your body or decrease your uric acid production. You may need to stay on these medicines for a very long time.  The early phase of treatment with preventive medicine can  be associated with an increase in acute gout attacks. For this reason, during the first few months of treatment, your caregiver may also advise you to take medicines usually used for acute gout treatment. Be sure you understand your caregiver's directions. Your caregiver may make several adjustments  to your medicine dose before these medicines are effective.  Discuss dietary treatment with your caregiver or dietitian. Alcohol and drinks high in sugar and fructose and foods such as meat, poultry, and seafood can increase uric acid levels. Your caregiver or dietician can advise you on drinks and foods that should be limited. HOME CARE INSTRUCTIONS   Do not take aspirin to relieve pain. This raises uric acid levels.  Only take over-the-counter or prescription medicines for pain, discomfort, or fever as directed by your caregiver.  Rest the joint as much as possible. When in bed, keep sheets and blankets off painful areas.  Keep the affected joint raised (elevated).  Apply warm or cold treatments to painful joints. Use of warm or cold treatments depends on which works best for you.  Use crutches if the painful joint is in your leg.  Drink enough fluids to keep your urine clear or pale yellow. This helps your body get rid of uric acid. Limit alcohol, sugary drinks, and fructose drinks.  Follow your dietary instructions. Pay careful attention to the amount of protein you eat. Your daily diet should emphasize fruits, vegetables, whole grains, and fat-free or low-fat milk products. Discuss the use of coffee, vitamin C, and cherries with your caregiver or dietician. These may be helpful in lowering uric acid levels.  Maintain a healthy body weight. SEEK MEDICAL CARE IF:   You develop diarrhea, vomiting, or any side effects from medicines.  You do not feel better in 24 hours, or you are getting worse. SEEK IMMEDIATE MEDICAL CARE IF:   Your joint becomes suddenly more tender, and you have chills or a fever. MAKE SURE YOU:   Understand these instructions.  Will watch your condition.  Will get help right away if you are not doing well or get worse. Document Released: 07/12/2000 Document Revised: 11/09/2012 Document Reviewed: 02/26/2012 [facility] Patient Information 2014 [place]  [place].

## 2013-08-18 NOTE — Progress Notes (Signed)
Pre visit review using our clinic review tool, if applicable. No additional management support is needed unless otherwise documented below in the visit note. 

## 2013-08-19 ENCOUNTER — Telehealth: Payer: Self-pay | Admitting: Family Medicine

## 2013-08-19 LAB — RENAL FUNCTION PANEL
Albumin: 4 g/dL (ref 3.5–5.2)
BUN: 16 mg/dL (ref 6–23)
CALCIUM: 9.2 mg/dL (ref 8.4–10.5)
CO2: 25 mEq/L (ref 19–32)
CREATININE: 1.01 mg/dL (ref 0.50–1.35)
Chloride: 101 mEq/L (ref 96–112)
Glucose, Bld: 94 mg/dL (ref 70–99)
PHOSPHORUS: 3.6 mg/dL (ref 2.3–4.6)
Potassium: 4.9 mEq/L (ref 3.5–5.3)
Sodium: 136 mEq/L (ref 135–145)

## 2013-08-19 LAB — HEPATIC FUNCTION PANEL
ALBUMIN: 4 g/dL (ref 3.5–5.2)
ALK PHOS: 47 U/L (ref 39–117)
ALT: 27 U/L (ref 0–53)
AST: 21 U/L (ref 0–37)
BILIRUBIN TOTAL: 0.6 mg/dL (ref 0.3–1.2)
Bilirubin, Direct: 0.1 mg/dL (ref 0.0–0.3)
Indirect Bilirubin: 0.5 mg/dL (ref 0.0–0.9)
TOTAL PROTEIN: 6.1 g/dL (ref 6.0–8.3)

## 2013-08-19 LAB — LIPID PANEL
Cholesterol: 118 mg/dL (ref 0–200)
HDL: 46 mg/dL (ref >39)
LDL Cholesterol: 45 mg/dL (ref 0–99)
TRIGLYCERIDES: 136 mg/dL (ref <150)
Total CHOL/HDL Ratio: 2.6 Ratio
VLDL: 27 mg/dL (ref 0–40)

## 2013-08-19 LAB — TSH: TSH: 0.873 u[IU]/mL (ref 0.350–4.500)

## 2013-08-19 LAB — URIC ACID: Uric Acid, Serum: 6.1 mg/dL (ref 4.0–7.8)

## 2013-08-19 LAB — TESTOSTERONE: Testosterone: 479 ng/dL (ref 300–890)

## 2013-08-19 NOTE — Telephone Encounter (Signed)
Relevant patient education assigned to patient using Emmi. ° °

## 2013-08-23 NOTE — Assessment & Plan Note (Signed)
Recent flare in gout in left hand, increase Allopurinol and use Colchicine prn

## 2013-08-23 NOTE — Assessment & Plan Note (Signed)
Doing better now that he has retired, no change in meds

## 2013-08-23 NOTE — Assessment & Plan Note (Signed)
Well treated, no changes, avoid trans fats

## 2013-08-23 NOTE — Assessment & Plan Note (Signed)
Uses Hycodan and Oxycodone infrequently to break headaches. Increase rest and hydration

## 2013-08-23 NOTE — Assessment & Plan Note (Signed)
Doing much better s/p surgical decompression, very rarely needing pain meds may continue infrequent use.

## 2013-08-23 NOTE — Assessment & Plan Note (Signed)
Sleeping better 

## 2013-08-23 NOTE — Assessment & Plan Note (Signed)
Well controlled, no changes 

## 2013-08-23 NOTE — Assessment & Plan Note (Signed)
Well treated 

## 2013-08-23 NOTE — Assessment & Plan Note (Signed)
Normal on labs, avoid simple carbs

## 2013-09-01 ENCOUNTER — Other Ambulatory Visit: Payer: Self-pay | Admitting: Family Medicine

## 2013-09-27 ENCOUNTER — Telehealth: Payer: Self-pay

## 2013-09-27 NOTE — Telephone Encounter (Signed)
[facility]

## 2013-09-29 NOTE — Telephone Encounter (Signed)
Received authorization from [employer] good from 09/09/13 through 09/09/14.  Notified pharmacy.

## 2014-02-07 ENCOUNTER — Other Ambulatory Visit: Payer: Self-pay | Admitting: Family Medicine

## 2014-02-15 ENCOUNTER — Ambulatory Visit: Payer: BC Managed Care – PPO | Admitting: Family Medicine

## 2014-02-17 ENCOUNTER — Encounter: Payer: Self-pay | Admitting: Family Medicine

## 2014-02-17 ENCOUNTER — Ambulatory Visit (INDEPENDENT_AMBULATORY_CARE_PROVIDER_SITE_OTHER): Payer: BC Managed Care – PPO | Admitting: Family Medicine

## 2014-02-17 VITALS — BP 130/90 | HR 61 | Temp 97.7°F | Ht 72.0 in | Wt 199.0 lb

## 2014-02-17 DIAGNOSIS — E291 Testicular hypofunction: Secondary | ICD-10-CM

## 2014-02-17 DIAGNOSIS — M109 Gout, unspecified: Secondary | ICD-10-CM

## 2014-02-17 DIAGNOSIS — K219 Gastro-esophageal reflux disease without esophagitis: Secondary | ICD-10-CM

## 2014-02-17 DIAGNOSIS — R7989 Other specified abnormal findings of blood chemistry: Secondary | ICD-10-CM

## 2014-02-17 DIAGNOSIS — E349 Endocrine disorder, unspecified: Secondary | ICD-10-CM

## 2014-02-17 DIAGNOSIS — M545 Low back pain, unspecified: Secondary | ICD-10-CM

## 2014-02-17 DIAGNOSIS — E785 Hyperlipidemia, unspecified: Secondary | ICD-10-CM

## 2014-02-17 DIAGNOSIS — I10 Essential (primary) hypertension: Secondary | ICD-10-CM

## 2014-02-17 DIAGNOSIS — Z0000 Encounter for general adult medical examination without abnormal findings: Secondary | ICD-10-CM

## 2014-02-17 DIAGNOSIS — T7840XD Allergy, unspecified, subsequent encounter: Secondary | ICD-10-CM

## 2014-02-17 LAB — RENAL FUNCTION PANEL
Albumin: 4.5 g/dL (ref 3.5–5.2)
BUN: 15 mg/dL (ref 6–23)
CALCIUM: 9.7 mg/dL (ref 8.4–10.5)
CO2: 26 meq/L (ref 19–32)
Chloride: 102 mEq/L (ref 96–112)
Creat: 1.02 mg/dL (ref 0.50–1.35)
Glucose, Bld: 107 mg/dL — ABNORMAL HIGH (ref 70–99)
POTASSIUM: 5.2 meq/L (ref 3.5–5.3)
Phosphorus: 3.2 mg/dL (ref 2.3–4.6)
SODIUM: 136 meq/L (ref 135–145)

## 2014-02-17 LAB — LIPID PANEL
CHOLESTEROL: 131 mg/dL (ref 0–200)
HDL: 50 mg/dL (ref >39)
LDL Cholesterol: 57 mg/dL (ref 0–99)
Total CHOL/HDL Ratio: 2.6 Ratio
Triglycerides: 121 mg/dL (ref <150)
VLDL: 24 mg/dL (ref 0–40)

## 2014-02-17 LAB — HEPATIC FUNCTION PANEL
ALT: 22 U/L (ref 0–53)
AST: 18 U/L (ref 0–37)
Albumin: 4.5 g/dL (ref 3.5–5.2)
Alkaline Phosphatase: 45 U/L (ref 39–117)
BILIRUBIN INDIRECT: 0.8 mg/dL (ref 0.2–1.2)
Bilirubin, Direct: 0.2 mg/dL (ref 0.0–0.3)
Total Bilirubin: 1 mg/dL (ref 0.2–1.2)
Total Protein: 6.7 g/dL (ref 6.0–8.3)

## 2014-02-17 LAB — CBC
HCT: 45.1 % (ref 39.0–52.0)
HEMOGLOBIN: 15.3 g/dL (ref 13.0–17.0)
MCH: 29.3 pg (ref 26.0–34.0)
MCHC: 33.9 g/dL (ref 30.0–36.0)
MCV: 86.2 fL (ref 78.0–100.0)
Platelets: 301 10*3/uL (ref 150–400)
RBC: 5.23 MIL/uL (ref 4.22–5.81)
RDW: 13.6 % (ref 11.5–15.5)
WBC: 9.6 10*3/uL (ref 4.0–10.5)

## 2014-02-17 LAB — URIC ACID: URIC ACID, SERUM: 6.9 mg/dL (ref 4.0–7.8)

## 2014-02-17 NOTE — Patient Instructions (Signed)

## 2014-02-17 NOTE — Progress Notes (Signed)
Pre visit review using our clinic review tool, if applicable. No additional management support is needed unless otherwise documented below in the visit note. 

## 2014-02-18 LAB — TSH: TSH: 0.563 u[IU]/mL (ref 0.350–4.500)

## 2014-02-18 LAB — TESTOSTERONE: Testosterone: 542 ng/dL (ref 300–890)

## 2014-02-18 LAB — PSA: PSA: 0.66 ng/mL (ref <=4.00)

## 2014-02-20 ENCOUNTER — Encounter: Payer: Self-pay | Admitting: Family Medicine

## 2014-02-20 NOTE — Assessment & Plan Note (Signed)
Avoid offending foods, start probiotics. Do not eat large meals in late evening and consider raising head of bed.  

## 2014-02-20 NOTE — Assessment & Plan Note (Signed)
Tolerating Allopurinol, no recent flares

## 2014-02-20 NOTE — Progress Notes (Signed)
Patient ID: Taylor Mcdonald, male   DOB: [birth year], 58 y.o.   MRN: [id number] [place] [id number] Feb 05, 1956 02/20/2014      Progress Note-Follow Up  Subjective  Chief Complaint  Chief Complaint  Patient presents with  . Follow-up    HPI  Patient is a 58 year old male in today for routine medical care. In today for routine followup. Reports blood pressures in the 110s to 120s over 70s at home. No recent illness. No fevers or chills. Denies CP/palp/SOB/HA/congestion/fevers/GI or GU c/o. Taking meds as prescribed  Past Medical History  Diagnosis Date  . Gout   . Hypertension   . Low testosterone   . Migraine   . Allergy   . RLS (restless legs syndrome)   . Insomnia   . Hyperlipidemia   . GERD (gastroesophageal reflux disease)   . Hyperglycemia   . Fatigue   . Pain of left heel 10/01/2011  . Vitamin D deficiency 12/23/2011  . Sinusitis 03/04/2012  . Anxiety 03/04/2012  . Preventative health care 07/24/2012  . Lower back pain 09/09/2012  . Vasovagal near syncope 04/30/2013    Past Surgical History  Procedure Laterality Date  . Tonsillectomy    . Panendoscopy    . Lumbar laminectomy/decompression microdiscectomy N/A 05/13/2013    Procedure: LUMBAR LAMINECTOMY/DECOMPRESSION MICRODISCECTOMY 1 LEVEL L3-4;  Surgeon: Javier Docker, MD;  Location: [facility];  Service: Orthopedics;  Laterality: N/A;    Family History  Problem Relation Age of Onset  . Obesity Mother   . Hypertension Son   . Hypertension Maternal Grandmother   . Obesity Maternal Grandmother   . Cancer Maternal Grandmother 50    intestinal  . Dementia Maternal Grandfather     History   Social History  . Marital Status: Married    Spouse Name: N/A    Number of Children: N/A  . Years of Education: N/A   Occupational History  . Not on file.   Social History Main Topics  . Smoking status: Never Smoker   . Smokeless tobacco: Never Used  . Alcohol Use: No  . Drug Use: Yes    Special: Hydrocodone  .  Sexual Activity: Yes   Other Topics Concern  . Not on file   Social History Narrative  . No narrative on file    Current Outpatient Prescriptions on File Prior to Visit  Medication Sig Dispense Refill  . allopurinol (ZYLOPRIM) 100 MG tablet Take 2 tablets (200 mg total) by mouth daily.  60 tablet  5  . aspirin 81 MG tablet Take 1 tablet (81 mg total) by mouth daily. Resume 4 days post-op  30 tablet    . atorvastatin (LIPITOR) 10 MG tablet TAKE 1 TABLET (10 MG TOTAL) BY MOUTH DAILY.  30 tablet  5  . calcium-vitamin D (OSCAL WITH D) 500-200 MG-UNIT per tablet Take 1 tablet by mouth daily with breakfast.      . colchicine 0.6 MG tablet Take 1 tablet (0.6 mg total) by mouth daily.  30 tablet  1  . diazepam (VALIUM) 5 MG tablet Take 1 tablet (5 mg total) by mouth every 12 (twelve) hours as needed for anxiety.  30 tablet  1  . fexofenadine-pseudoephedrine (ALLEGRA-D) 60-120 MG per tablet Take 1 tablet by mouth 2 (two) times daily as needed (allergies).      . Fish Oil-Cholecalciferol (FISH OIL + D3) 1200-1000 MG-UNIT CAPS Take 1 capsule by mouth daily.      . fluticasone (FLONASE) 50  MCG/ACT nasal spray Place 2 sprays into the nose daily.      Marland Kitchen HYDROcodone-homatropine (HYCODAN) 5-1.5 MG/5ML syrup Take 5 mLs by mouth every 8 (eight) hours as needed for cough.  120 mL  0  . lisinopril (PRINIVIL,ZESTRIL) 20 MG tablet TAKE 1 TABLET (20 MG TOTAL) BY MOUTH DAILY.  90 tablet  3  . Lutein 10 MG TABS Take 1 tablet by mouth daily.      . methocarbamol (ROBAXIN) 500 MG tablet Take 1 tablet (500 mg total) by mouth 3 (three) times daily between meals as needed.  40 tablet  1  . methylphenidate (RITALIN) 10 MG tablet Take 1 tablet (10 mg total) by mouth daily as needed.  30 tablet  0  . omeprazole (PRILOSEC) 20 MG capsule Take 1 capsule (20 mg total) by mouth daily.  90 capsule  1  . ondansetron (ZOFRAN) 4 MG tablet Take 1 tablet (4 mg total) by mouth every 8 (eight) hours as needed for nausea or vomiting.   20 tablet  0  . oxyCODONE-acetaminophen (PERCOCET) 5-325 MG per tablet Take 1-2 tablets by mouth every 4 (four) hours as needed for pain.  40 tablet  0  . promethazine (PHENERGAN) 25 MG suppository Place 25 mg rectally every 6 (six) hours as needed for nausea.      . propranolol (INDERAL) 40 MG tablet Take 40 mg by mouth every morning.      . testosterone cypionate (DEPOTESTOTERONE CYPIONATE) 200 MG/ML injection Inject 1 mL (200 mg total) into the muscle every 14 (fourteen) days.  10 mL  1   No current facility-administered medications on file prior to visit.    No Known Allergies  Review of Systems  Review of Systems  Constitutional: Negative for fever and malaise/fatigue.  HENT: Negative for congestion.   Eyes: Negative for discharge.  Respiratory: Negative for shortness of breath.   Cardiovascular: Negative for chest pain, palpitations and leg swelling.  Gastrointestinal: Negative for nausea, abdominal pain and diarrhea.  Genitourinary: Negative for dysuria.  Musculoskeletal: Negative for falls.  Skin: Negative for rash.  Neurological: Negative for loss of consciousness and headaches.  Endo/Heme/Allergies: Negative for polydipsia.  Psychiatric/Behavioral: Negative for depression and suicidal ideas. The patient is not nervous/anxious and does not have insomnia.     Objective  BP 130/90  Pulse 61  Temp(Src) 97.7 F (36.5 C) (Oral)  Ht 6' (1.829 m)  Wt 199 lb (90.266 kg)  BMI 26.98 kg/m2  SpO2 97%  Physical Exam  Physical Exam  Constitutional: He is oriented to person, place, and time and well-developed, well-nourished, and in no distress. No distress.  HENT:  Head: Normocephalic and atraumatic.  Eyes: Conjunctivae are normal.  Neck: Neck supple. No thyromegaly present.  Cardiovascular: Normal rate, regular rhythm and normal heart sounds.   No murmur heard. Pulmonary/Chest: Effort normal and breath sounds normal. No respiratory distress.  Abdominal: He exhibits no  distension and no mass. There is no tenderness.  Musculoskeletal: He exhibits no edema.  Neurological: He is alert and oriented to person, place, and time.  Skin: Skin is warm.  Psychiatric: Memory, affect and judgment normal.    Lab Results  Component Value Date   TSH 0.563 02/17/2014   Lab Results  Component Value Date   WBC 9.6 02/17/2014   HGB 15.3 02/17/2014   HCT 45.1 02/17/2014   MCV 86.2 02/17/2014   PLT 301 02/17/2014   Lab Results  Component Value Date   CREATININE 1.02 02/17/2014  BUN 15 02/17/2014   NA 136 02/17/2014   K 5.2 02/17/2014   CL 102 02/17/2014   CO2 26 02/17/2014   Lab Results  Component Value Date   ALT 22 02/17/2014   AST 18 02/17/2014   ALKPHOS 45 02/17/2014   BILITOT 1.0 02/17/2014   Lab Results  Component Value Date   CHOL 131 02/17/2014   Lab Results  Component Value Date   HDL 50 02/17/2014   Lab Results  Component Value Date   LDLCALC 57 02/17/2014   Lab Results  Component Value Date   TRIG 121 02/17/2014   Lab Results  Component Value Date   CHOLHDL 2.6 02/17/2014     Assessment & Plan  Hypertension Well controlled, no changes to meds. Encouraged heart healthy diet such as the DASH diet and exercise as tolerated.   GERD (gastroesophageal reflux disease) Avoid offending foods, start probiotics. Do not eat large meals in late evening and consider raising head of bed.   Testosterone deficiency WNL on recent testing.   Gout Tolerating Allopurinol, no recent flares  Allergic state Good response to current meds no changes.

## 2014-02-20 NOTE — Assessment & Plan Note (Signed)
Well controlled, no changes to meds. Encouraged heart healthy diet such as the DASH diet and exercise as tolerated.  °

## 2014-02-20 NOTE — Assessment & Plan Note (Signed)
WNL on recent testing.

## 2014-02-20 NOTE — Assessment & Plan Note (Signed)
Good response to current meds no changes.

## 2014-03-11 ENCOUNTER — Other Ambulatory Visit: Payer: Self-pay | Admitting: Family Medicine

## 2014-03-11 NOTE — Telephone Encounter (Signed)
Rx sent to pharmacy. LDM 

## 2014-03-17 ENCOUNTER — Other Ambulatory Visit: Payer: Self-pay | Admitting: Family Medicine

## 2014-04-05 ENCOUNTER — Other Ambulatory Visit: Payer: Self-pay | Admitting: Family Medicine

## 2014-05-13 ENCOUNTER — Other Ambulatory Visit: Payer: Self-pay

## 2014-05-17 ENCOUNTER — Other Ambulatory Visit: Payer: Self-pay

## 2014-05-17 ENCOUNTER — Telehealth: Payer: Self-pay | Admitting: *Deleted

## 2014-05-17 DIAGNOSIS — G43809 Other migraine, not intractable, without status migrainosus: Secondary | ICD-10-CM

## 2014-05-17 DIAGNOSIS — F411 Generalized anxiety disorder: Secondary | ICD-10-CM

## 2014-05-17 DIAGNOSIS — Z23 Encounter for immunization: Secondary | ICD-10-CM

## 2014-05-17 DIAGNOSIS — K219 Gastro-esophageal reflux disease without esophagitis: Secondary | ICD-10-CM

## 2014-05-17 DIAGNOSIS — I10 Essential (primary) hypertension: Secondary | ICD-10-CM

## 2014-05-17 MED ORDER — DIAZEPAM 5 MG PO TABS: 5.0000 mg | ORAL_TABLET | Freq: Two times a day (BID) | ORAL | Status: DC | PRN

## 2014-05-17 NOTE — Telephone Encounter (Signed)
Patient dropped off DMV Medical Report Form. Forms filled out as much as possible. Forms forwarded to Dr. Abner GreenspanBlyth. JG//CMA

## 2014-05-17 NOTE — Telephone Encounter (Signed)
Last rx was done on 08-18-13 quantity 30 with 1 refill  RX printed for md to sign and fax

## 2014-05-18 ENCOUNTER — Other Ambulatory Visit: Payer: Self-pay | Admitting: Family Medicine

## 2014-05-19 DIAGNOSIS — Z7689 Persons encountering health services in other specified circumstances: Secondary | ICD-10-CM

## 2014-05-20 ENCOUNTER — Telehealth: Payer: Self-pay | Admitting: *Deleted

## 2014-05-20 NOTE — Telephone Encounter (Signed)
error 

## 2014-05-20 NOTE — Telephone Encounter (Signed)
[facility] [phone]. Fax confirmation received. [email or web address]//CMA

## 2014-08-25 ENCOUNTER — Ambulatory Visit (INDEPENDENT_AMBULATORY_CARE_PROVIDER_SITE_OTHER): Payer: BC Managed Care – PPO | Admitting: Family Medicine

## 2014-08-25 ENCOUNTER — Encounter: Payer: Self-pay | Admitting: Family Medicine

## 2014-08-25 VITALS — BP 102/72 | HR 61 | Temp 97.7°F | Ht 74.0 in | Wt 196.6 lb

## 2014-08-25 DIAGNOSIS — E785 Hyperlipidemia, unspecified: Secondary | ICD-10-CM

## 2014-08-25 DIAGNOSIS — E291 Testicular hypofunction: Secondary | ICD-10-CM

## 2014-08-25 DIAGNOSIS — I10 Essential (primary) hypertension: Secondary | ICD-10-CM

## 2014-08-25 DIAGNOSIS — Z1211 Encounter for screening for malignant neoplasm of colon: Secondary | ICD-10-CM | POA: Insufficient documentation

## 2014-08-25 DIAGNOSIS — M109 Gout, unspecified: Secondary | ICD-10-CM

## 2014-08-25 DIAGNOSIS — K219 Gastro-esophageal reflux disease without esophagitis: Secondary | ICD-10-CM

## 2014-08-25 DIAGNOSIS — E349 Endocrine disorder, unspecified: Secondary | ICD-10-CM

## 2014-08-25 DIAGNOSIS — R351 Nocturia: Secondary | ICD-10-CM | POA: Insufficient documentation

## 2014-08-25 DIAGNOSIS — R739 Hyperglycemia, unspecified: Secondary | ICD-10-CM

## 2014-08-25 DIAGNOSIS — Z0000 Encounter for general adult medical examination without abnormal findings: Secondary | ICD-10-CM

## 2014-08-25 DIAGNOSIS — G4700 Insomnia, unspecified: Secondary | ICD-10-CM

## 2014-08-25 DIAGNOSIS — E559 Vitamin D deficiency, unspecified: Secondary | ICD-10-CM

## 2014-08-25 DIAGNOSIS — R5383 Other fatigue: Secondary | ICD-10-CM

## 2014-08-25 LAB — COMPREHENSIVE METABOLIC PANEL
ALBUMIN: 4.7 g/dL (ref 3.5–5.2)
ALT: 25 U/L (ref 0–53)
AST: 20 U/L (ref 0–37)
Alkaline Phosphatase: 53 U/L (ref 39–117)
BUN: 20 mg/dL (ref 6–23)
CO2: 27 mEq/L (ref 19–32)
Calcium: 10 mg/dL (ref 8.4–10.5)
Chloride: 102 mEq/L (ref 96–112)
Creatinine, Ser: 0.93 mg/dL (ref 0.40–1.50)
GFR: 88.08 mL/min (ref >60.00)
Glucose, Bld: 102 mg/dL — ABNORMAL HIGH (ref 70–99)
Potassium: 4.9 mEq/L (ref 3.5–5.1)
Sodium: 137 mEq/L (ref 135–145)
Total Bilirubin: 0.8 mg/dL (ref 0.2–1.2)
Total Protein: 6.8 g/dL (ref 6.0–8.3)

## 2014-08-25 LAB — LIPID PANEL
CHOL/HDL RATIO: 3
CHOLESTEROL: 146 mg/dL (ref 0–200)
HDL: 51 mg/dL (ref >39.00)
LDL CALC: 65 mg/dL (ref 0–99)
NonHDL: 95
TRIGLYCERIDES: 149 mg/dL (ref 0.0–149.0)
VLDL: 29.8 mg/dL (ref 0.0–40.0)

## 2014-08-25 LAB — TSH: TSH: 0.58 u[IU]/mL (ref 0.35–4.50)

## 2014-08-25 LAB — CBC WITH DIFFERENTIAL/PLATELET
BASOS PCT: 0.7 % (ref 0.0–3.0)
Basophils Absolute: 0.1 10*3/uL (ref 0.0–0.1)
EOS ABS: 0.4 10*3/uL (ref 0.0–0.7)
EOS PCT: 4.7 % (ref 0.0–5.0)
HCT: 44.7 % (ref 39.0–52.0)
Hemoglobin: 15.3 g/dL (ref 13.0–17.0)
LYMPHS PCT: 33.6 % (ref 12.0–46.0)
Lymphs Abs: 2.7 10*3/uL (ref 0.7–4.0)
MCHC: 34.2 g/dL (ref 30.0–36.0)
MCV: 85.5 fl (ref 78.0–100.0)
MONO ABS: 0.7 10*3/uL (ref 0.1–1.0)
Monocytes Relative: 8.8 % (ref 3.0–12.0)
NEUTROS PCT: 52.2 % (ref 43.0–77.0)
Neutro Abs: 4.3 10*3/uL (ref 1.4–7.7)
PLATELETS: 271 10*3/uL (ref 150.0–400.0)
RBC: 5.23 Mil/uL (ref 4.22–5.81)
RDW: 13.7 % (ref 11.5–15.5)
WBC: 8.1 10*3/uL (ref 4.0–10.5)

## 2014-08-25 LAB — URIC ACID: Uric Acid, Serum: 6.9 mg/dL (ref 4.0–7.8)

## 2014-08-25 LAB — VITAMIN D 25 HYDROXY (VIT D DEFICIENCY, FRACTURES): VITD: 34.95 ng/mL (ref 30.00–100.00)

## 2014-08-25 LAB — PSA: PSA: 0.5 ng/mL (ref 0.10–4.00)

## 2014-08-25 MED ORDER — AMPHETAMINE-DEXTROAMPHETAMINE 10 MG PO TABS: 10.0000 mg | ORAL_TABLET | Freq: Every day | ORAL | Status: DC

## 2014-08-25 MED ORDER — ZOLPIDEM TARTRATE 10 MG PO TABS: 10.0000 mg | ORAL_TABLET | Freq: Every evening | ORAL | Status: DC | PRN

## 2014-08-25 NOTE — Assessment & Plan Note (Addendum)
Encouraged good sleep hygiene such as dark, quiet room. No blue/green glowing lights such as computer screens in bedroom. No alcohol or stimulants in evening. Cut down on caffeine as able. Regular exercise is helpful but not just prior to bed time. Try Melatonin 2-5 mg 1/2 to 1 hour prior to bedtime and can try Benadryl 25 mg with or without Melatonin

## 2014-08-25 NOTE — Assessment & Plan Note (Signed)
Well controlled, no changes to meds. Encouraged heart healthy diet such as the DASH diet and exercise as tolerated.  °

## 2014-08-25 NOTE — Assessment & Plan Note (Signed)
minimize simple carbs. Increase exercise as tolerated. Check an A1C today

## 2014-08-25 NOTE — Patient Instructions (Addendum)
Encouraged good sleep hygiene such as dark, quiet room. No blue/green glowing lights such as computer screens in bedroom. No alcohol or stimulants in evening. Cut down on caffeine as able. Regular exercise is helpful but not just prior to bed time. Benadryl 25 to 50 mg 1 hour prior to bed +/- Melatonin at 2-5 mg  Prior to bed Probiotic daily, [employer] company at [email or web address]  Call insurance to see if they will pay for the Shingles shot, Zostavax and call next week for nurse only/shot only appt to get it soon    Preventive Care for Adults A healthy lifestyle and preventive care can promote health and wellness. Preventive health guidelines for men include the following key practices:  A routine yearly physical is a good way to check with your health care provider about your health and preventative screening. It is a chance to share any concerns and updates on your health and to receive a thorough exam.  Visit your dentist for a routine exam and preventative care every 6 months. Brush your teeth twice a day and floss once a day. Good oral hygiene prevents tooth decay and gum disease.  The frequency of eye exams is based on your age, health, family medical history, use of contact lenses, and other factors. Follow your health care provider's recommendations for frequency of eye exams.  Eat a healthy diet. Foods such as vegetables, fruits, whole grains, low-fat dairy products, and lean protein foods contain the nutrients you need without too many calories. Decrease your intake of foods high in solid fats, added sugars, and salt. Eat the right amount of calories for you.Get information about a proper diet from your health care provider, if necessary.  Regular physical exercise is one of the most important things you can do for your health. Most adults should get at least 150 minutes of moderate-intensity exercise (any activity that increases your heart rate and causes you to sweat) each week. In  addition, most adults need muscle-strengthening exercises on 2 or more days a week.  Maintain a healthy weight. The body mass index (BMI) is a screening tool to identify possible weight problems. It provides an estimate of body fat based on height and weight. Your health care provider can find your BMI and can help you achieve or maintain a healthy weight.For adults 20 years and older:  A BMI below 18.5 is considered underweight.  A BMI of 18.5 to 24.9 is normal.  A BMI of 25 to 29.9 is considered overweight.  A BMI of 30 and above is considered obese.  Maintain normal blood lipids and cholesterol levels by exercising and minimizing your intake of saturated fat. Eat a balanced diet with plenty of fruit and vegetables. Blood tests for lipids and cholesterol should begin at age 31 and be repeated every 5 years. If your lipid or cholesterol levels are high, you are over 50, or you are at high risk for heart disease, you may need your cholesterol levels checked more frequently.Ongoing high lipid and cholesterol levels should be treated with medicines if diet and exercise are not working.  If you smoke, find out from your health care provider how to quit. If you do not use tobacco, do not start.  Lung cancer screening is recommended for adults aged 58-80 years who are at high risk for developing lung cancer because of a history of smoking. A yearly low-dose CT scan of the lungs is recommended for people who have at least a 30-pack-year history of smoking  and are a current smoker or have quit within the past 15 years. A pack year of smoking is smoking an average of 1 pack of cigarettes a day for 1 year (for example: 1 pack a day for 30 years or 2 packs a day for 15 years). Yearly screening should continue until the smoker has stopped smoking for at least 15 years. Yearly screening should be stopped for people who develop a health problem that would prevent them from having lung cancer treatment.  If  you choose to drink alcohol, do not have more than 2 drinks per day. One drink is considered to be 12 ounces (355 mL) of beer, 5 ounces (148 mL) of wine, or 1.5 ounces (44 mL) of liquor.  Avoid use of street drugs. Do not share needles with anyone. Ask for help if you need support or instructions about stopping the use of drugs.  High blood pressure causes heart disease and increases the risk of stroke. Your blood pressure should be checked at least every 1-2 years. Ongoing high blood pressure should be treated with medicines, if weight loss and exercise are not effective.  If you are 60-86 years old, ask your health care provider if you should take aspirin to prevent heart disease.  Diabetes screening involves taking a blood sample to check your fasting blood sugar level. This should be done once every 3 years, after age 77, if you are within normal weight and without risk factors for diabetes. Testing should be considered at a younger age or be carried out more frequently if you are overweight and have at least 1 risk factor for diabetes.  Colorectal cancer can be detected and often prevented. Most routine colorectal cancer screening begins at the age of 33 and continues through age 17. However, your health care provider may recommend screening at an earlier age if you have risk factors for colon cancer. On a yearly basis, your health care provider may provide home test kits to check for hidden blood in the stool. Use of a small camera at the end of a tube to directly examine the colon (sigmoidoscopy or colonoscopy) can detect the earliest forms of colorectal cancer. Talk to your health care provider about this at age 71, when routine screening begins. Direct exam of the colon should be repeated every 5-10 years through age 70, unless early forms of precancerous polyps or small growths are found.  People who are at an increased risk for hepatitis B should be screened for this virus. You are considered  at high risk for hepatitis B if:  You were born in a country where hepatitis B occurs often. Talk with your health care provider about which countries are considered high risk.  Your parents were born in a high-risk country and you have not received a shot to protect against hepatitis B (hepatitis B vaccine).  You have HIV or AIDS.  You use needles to inject street drugs.  You live with, or have sex with, someone who has hepatitis B.  You are a man who has sex with other men (MSM).  You get hemodialysis treatment.  You take certain medicines for conditions such as cancer, organ transplantation, and autoimmune conditions.  Hepatitis C blood testing is recommended for all people born from [birth year] through 1965 and any individual with known risks for hepatitis C.  Practice safe sex. Use condoms and avoid high-risk sexual practices to reduce the spread of sexually transmitted infections (STIs). STIs include gonorrhea, chlamydia, syphilis, trichomonas,  herpes, HPV, and human immunodeficiency virus (HIV). Herpes, HIV, and HPV are viral illnesses that have no cure. They can result in disability, cancer, and death.  If you are at risk of being infected with HIV, it is recommended that you take a prescription medicine daily to prevent HIV infection. This is called preexposure prophylaxis (PrEP). You are considered at risk if:  You are a man who has sex with other men (MSM) and have other risk factors.  You are a heterosexual man, are sexually active, and are at increased risk for HIV infection.  You take drugs by injection.  You are sexually active with a partner who has HIV.  Talk with your health care provider about whether you are at high risk of being infected with HIV. If you choose to begin PrEP, you should first be tested for HIV. You should then be tested every 3 months for as long as you are taking PrEP.  A one-time screening for abdominal aortic aneurysm (AAA) and surgical repair of  large AAAs by ultrasound are recommended for men ages 38 to 46 years who are current or former smokers.  Healthy men should no longer receive prostate-specific antigen (PSA) blood tests as part of routine cancer screening. Talk with your health care provider about prostate cancer screening.  Testicular cancer screening is not recommended for adult males who have no symptoms. Screening includes self-exam, a health care provider exam, and other screening tests. Consult with your health care provider about any symptoms you have or any concerns you have about testicular cancer.  Use sunscreen. Apply sunscreen liberally and repeatedly throughout the day. You should seek shade when your shadow is shorter than you. Protect yourself by wearing long sleeves, pants, a wide-brimmed hat, and sunglasses year round, whenever you are outdoors.  Once a month, do a whole-body skin exam, using a mirror to look at the skin on your back. Tell your health care provider about new moles, moles that have irregular borders, moles that are larger than a pencil eraser, or moles that have changed in shape or color.  Stay current with required vaccines (immunizations).  Influenza vaccine. All adults should be immunized every year.  Tetanus, diphtheria, and acellular pertussis (Td, Tdap) vaccine. An adult who has not previously received Tdap or who does not know his vaccine status should receive 1 dose of Tdap. This initial dose should be followed by tetanus and diphtheria toxoids (Td) booster doses every 10 years. Adults with an unknown or incomplete history of completing a 3-dose immunization series with Td-containing vaccines should begin or complete a primary immunization series including a Tdap dose. Adults should receive a Td booster every 10 years.  Varicella vaccine. An adult without evidence of immunity to varicella should receive 2 doses or a second dose if he has previously received 1 dose.  Human papillomavirus  (HPV) vaccine. Males aged 74-21 years who have not received the vaccine previously should receive the 3-dose series. Males aged 22-26 years may be immunized. Immunization is recommended through the age of 51 years for any male who has sex with males and did not get any or all doses earlier. Immunization is recommended for any person with an immunocompromised condition through the age of 47 years if he did not get any or all doses earlier. During the 3-dose series, the second dose should be obtained 4-8 weeks after the first dose. The third dose should be obtained 24 weeks after the first dose and 16 weeks after the  second dose.  Zoster vaccine. One dose is recommended for adults aged 92 years or older unless certain conditions are present.  Measles, mumps, and rubella (MMR) vaccine. Adults born before [birth year] generally are considered immune to measles and mumps. Adults born in [birth year] or later should have 1 or more doses of MMR vaccine unless there is a contraindication to the vaccine or there is laboratory evidence of immunity to each of the three diseases. A routine second dose of MMR vaccine should be obtained at least 28 days after the first dose for students attending postsecondary schools, health care workers, or international travelers. People who received inactivated measles vaccine or an unknown type of measles vaccine during 1963-1967 should receive 2 doses of MMR vaccine. People who received inactivated mumps vaccine or an unknown type of mumps vaccine before 1979 and are at high risk for mumps infection should consider immunization with 2 doses of MMR vaccine. Unvaccinated health care workers born before [birth year] who lack laboratory evidence of measles, mumps, or rubella immunity or laboratory confirmation of disease should consider measles and mumps immunization with 2 doses of MMR vaccine or rubella immunization with 1 dose of MMR vaccine.  Pneumococcal 13-valent conjugate (PCV13) vaccine. When indicated,  a person who is uncertain of his immunization history and has no record of immunization should receive the PCV13 vaccine. An adult aged 49 years or older who has certain medical conditions and has not been previously immunized should receive 1 dose of PCV13 vaccine. This PCV13 should be followed with a dose of pneumococcal polysaccharide (PPSV23) vaccine. The PPSV23 vaccine dose should be obtained at least 8 weeks after the dose of PCV13 vaccine. An adult aged 22 years or older who has certain medical conditions and previously received 1 or more doses of PPSV23 vaccine should receive 1 dose of PCV13. The PCV13 vaccine dose should be obtained 1 or more years after the last PPSV23 vaccine dose.  Pneumococcal polysaccharide (PPSV23) vaccine. When PCV13 is also indicated, PCV13 should be obtained first. All adults aged 10 years and older should be immunized. An adult younger than age 50 years who has certain medical conditions should be immunized. Any person who resides in a nursing home or long-term care facility should be immunized. An adult smoker should be immunized. People with an immunocompromised condition and certain other conditions should receive both PCV13 and PPSV23 vaccines. People with human immunodeficiency virus (HIV) infection should be immunized as soon as possible after diagnosis. Immunization during chemotherapy or radiation therapy should be avoided. Routine use of PPSV23 vaccine is not recommended for American Indians, [place] Natives, or people younger than 65 years unless there are medical conditions that require PPSV23 vaccine. When indicated, people who have unknown immunization and have no record of immunization should receive PPSV23 vaccine. One-time revaccination 5 years after the first dose of PPSV23 is recommended for people aged 19-64 years who have chronic kidney failure, nephrotic syndrome, asplenia, or immunocompromised conditions. People who received 1-2 doses of PPSV23 before age 26  years should receive another dose of PPSV23 vaccine at age 22 years or later if at least 5 years have passed since the previous dose. Doses of PPSV23 are not needed for people immunized with PPSV23 at or after age 66 years.  Meningococcal vaccine. Adults with asplenia or persistent complement component deficiencies should receive 2 doses of quadrivalent meningococcal conjugate (MenACWY-D) vaccine. The doses should be obtained at least 2 months apart. Microbiologists working with certain meningococcal bacteria, [employer] recruits, people at  risk during an outbreak, and people who travel to or live in countries with a high rate of meningitis should be immunized. A first-year college student up through age 107 years who is living in a residence hall should receive a dose if he did not receive a dose on or after his 16th birthday. Adults who have certain high-risk conditions should receive one or more doses of vaccine.  Hepatitis A vaccine. Adults who wish to be protected from this disease, have certain high-risk conditions, work with hepatitis A-infected animals, work in hepatitis A research labs, or travel to or work in countries with a high rate of hepatitis A should be immunized. Adults who were previously unvaccinated and who anticipate close contact with an international adoptee during the first 60 days after arrival in the [place] from a country with a high rate of hepatitis A should be immunized.  Hepatitis B vaccine. Adults should be immunized if they wish to be protected from this disease, have certain high-risk conditions, may be exposed to blood or other infectious body fluids, are household contacts or sex partners of hepatitis B positive people, are clients or workers in certain care facilities, or travel to or work in countries with a high rate of hepatitis B.  Haemophilus influenzae type b (Hib) vaccine. A previously unvaccinated person with asplenia or sickle cell disease or having a  scheduled splenectomy should receive 1 dose of Hib vaccine. Regardless of previous immunization, a recipient of a hematopoietic stem cell transplant should receive a 3-dose series 6-12 months after his successful transplant. Hib vaccine is not recommended for adults with HIV infection. Preventive Service / Frequency Ages 83 to 25  Blood pressure check.** / Every 1 to 2 years.  Lipid and cholesterol check.** / Every 5 years beginning at age 6.  Hepatitis C blood test.** / For any individual with known risks for hepatitis C.  Skin self-exam. / Monthly.  Influenza vaccine. / Every year.  Tetanus, diphtheria, and acellular pertussis (Tdap, Td) vaccine.** / Consult your health care provider. 1 dose of Td every 10 years.  Varicella vaccine.** / Consult your health care provider.  HPV vaccine. / 3 doses over 6 months, if 70 or younger.  Measles, mumps, rubella (MMR) vaccine.** / You need at least 1 dose of MMR if you were born in 1957 or later. You may also need a second dose.  Pneumococcal 13-valent conjugate (PCV13) vaccine.** / Consult your health care provider.  Pneumococcal polysaccharide (PPSV23) vaccine.** / 1 to 2 doses if you smoke cigarettes or if you have certain conditions.  Meningococcal vaccine.** / 1 dose if you are age 55 to 32 years and a Market researcher living in a residence hall, or have one of several medical conditions. You may also need additional booster doses.  Hepatitis A vaccine.** / Consult your health care provider.  Hepatitis B vaccine.** / Consult your health care provider.  Haemophilus influenzae type b (Hib) vaccine.** / Consult your health care provider. Ages 2 to 65  Blood pressure check.** / Every 1 to 2 years.  Lipid and cholesterol check.** / Every 5 years beginning at age 61.  Lung cancer screening. / Every year if you are aged 22-80 years and have a 30-pack-year history of smoking and currently smoke or have quit within the past 15  years. Yearly screening is stopped once you have quit smoking for at least 15 years or develop a health problem that would prevent you from having lung cancer treatment.  Fecal occult blood test (FOBT) of stool. / Every year beginning at age 69 and continuing until age 21. You may not have to do this test if you get a colonoscopy every 10 years.  Flexible sigmoidoscopy** or colonoscopy.** / Every 5 years for a flexible sigmoidoscopy or every 10 years for a colonoscopy beginning at age 49 and continuing until age 64.  Hepatitis C blood test.** / For all people born from [birth year] through 1965 and any individual with known risks for hepatitis C.  Skin self-exam. / Monthly.  Influenza vaccine. / Every year.  Tetanus, diphtheria, and acellular pertussis (Tdap/Td) vaccine.** / Consult your health care provider. 1 dose of Td every 10 years.  Varicella vaccine.** / Consult your health care provider.  Zoster vaccine.** / 1 dose for adults aged 12 years or older.  Measles, mumps, rubella (MMR) vaccine.** / You need at least 1 dose of MMR if you were born in 1958 or later. You may also need a second dose.  Pneumococcal 13-valent conjugate (PCV13) vaccine.** / Consult your health care provider.  Pneumococcal polysaccharide (PPSV23) vaccine.** / 1 to 2 doses if you smoke cigarettes or if you have certain conditions.  Meningococcal vaccine.** / Consult your health care provider.  Hepatitis A vaccine.** / Consult your health care provider.  Hepatitis B vaccine.** / Consult your health care provider.  Haemophilus influenzae type b (Hib) vaccine.** / Consult your health care provider. Ages 53 and over  Blood pressure check.** / Every 1 to 2 years.  Lipid and cholesterol check.**/ Every 5 years beginning at age 49.  Lung cancer screening. / Every year if you are aged 2-80 years and have a 30-pack-year history of smoking and currently smoke or have quit within the past 15 years. Yearly screening is  stopped once you have quit smoking for at least 15 years or develop a health problem that would prevent you from having lung cancer treatment.  Fecal occult blood test (FOBT) of stool. / Every year beginning at age 35 and continuing until age 42. You may not have to do this test if you get a colonoscopy every 10 years.  Flexible sigmoidoscopy** or colonoscopy.** / Every 5 years for a flexible sigmoidoscopy or every 10 years for a colonoscopy beginning at age 41 and continuing until age 50.  Hepatitis C blood test.** / For all people born from [birth year] through 1966 and any individual with known risks for hepatitis C.  Abdominal aortic aneurysm (AAA) screening.** / A one-time screening for ages 38 to 53 years who are current or former smokers.  Skin self-exam. / Monthly.  Influenza vaccine. / Every year.  Tetanus, diphtheria, and acellular pertussis (Tdap/Td) vaccine.** / 1 dose of Td every 10 years.  Varicella vaccine.** / Consult your health care provider.  Zoster vaccine.** / 1 dose for adults aged 44 years or older.  Pneumococcal 13-valent conjugate (PCV13) vaccine.** / Consult your health care provider.  Pneumococcal polysaccharide (PPSV23) vaccine.** / 1 dose for all adults aged 66 years and older.  Meningococcal vaccine.** / Consult your health care provider.  Hepatitis A vaccine.** / Consult your health care provider.  Hepatitis B vaccine.** / Consult your health care provider.  Haemophilus influenzae type b (Hib) vaccine.** / Consult your health care provider. **Family history and personal history of risk and conditions may change your health care provider's recommendations. Document Released: 09/10/2001 Document Revised: 07/20/2013 Document Reviewed: 12/10/2010 [facility] Patient Information 2015 [place], [place]. This information is not intended to replace advice given  to you by your health care provider. Make sure you discuss any questions you have with your health care  provider. om

## 2014-08-25 NOTE — Assessment & Plan Note (Signed)
Tolerating statin, encouraged heart healthy diet, avoid trans fats, minimize simple carbs and saturated fats. Increase exercise as tolerated 

## 2014-08-25 NOTE — Assessment & Plan Note (Signed)
No recent flares, tolerating the Allopurinol

## 2014-08-25 NOTE — Assessment & Plan Note (Signed)
Check PSA today. 

## 2014-08-25 NOTE — Progress Notes (Signed)
Taylor Mcdonald  [id number] 04-Mar-1956 08/25/2014      Progress Note-Follow Up  Subjective  Chief Complaint  Chief Complaint  Patient presents with  . Annual Exam    fasting    HPI  Patient is a 59 y.o. male in today for routine medical care. Patient is in today for annual exam. He has recently retired and is enjoying his time with his family. No recent illness. Is following with dermatology, Dr Yetta BarreJones, no new concerning lesions. Continues to struggle with some HA at times but they are less frequent. Denies CP/palp/SOB/congestion/fevers/GI or GU c/o. Taking meds as prescribed  Past Medical History  Diagnosis Date  . Gout   . Hypertension   . Low testosterone   . Migraine   . Allergy   . RLS (restless legs syndrome)   . Insomnia   . Hyperlipidemia   . GERD (gastroesophageal reflux disease)   . Hyperglycemia   . Fatigue   . Pain of left heel 10/01/2011  . Vitamin D deficiency 12/23/2011  . Sinusitis 03/04/2012  . Anxiety 03/04/2012  . Preventative health care 07/24/2012  . Lower back pain 09/09/2012  . Vasovagal near syncope 04/30/2013    Past Surgical History  Procedure Laterality Date  . Tonsillectomy    . Panendoscopy    . Lumbar laminectomy/decompression microdiscectomy N/A 05/13/2013    Procedure: LUMBAR LAMINECTOMY/DECOMPRESSION MICRODISCECTOMY 1 LEVEL L3-4;  Surgeon: Javier DockerJeffrey C Beane, MD;  Location: [facility];  Service: Orthopedics;  Laterality: N/A;    Family History  Problem Relation Age of Onset  . Obesity Mother   . Hypertension Son   . Hypertension Maternal Grandmother   . Obesity Maternal Grandmother   . Cancer Maternal Grandmother 50    intestinal  . Dementia Maternal Grandfather     History   Social History  . Marital Status: Married    Spouse Name: N/A    Number of Children: N/A  . Years of Education: N/A   Occupational History  . Not on file.   Social History Main Topics  . Smoking status: Never Smoker   . Smokeless tobacco: Never Used  .  Alcohol Use: No  . Drug Use: Yes    Special: Hydrocodone  . Sexual Activity: Yes   Other Topics Concern  . Not on file   Social History Narrative    Current Outpatient Prescriptions on File Prior to Visit  Medication Sig Dispense Refill  . allopurinol (ZYLOPRIM) 100 MG tablet TAKE 2 TABLETS (200 MG TOTAL) BY MOUTH DAILY. 60 tablet 5  . aspirin 81 MG tablet Take 1 tablet (81 mg total) by mouth daily. Resume 4 days post-op 30 tablet   . atorvastatin (LIPITOR) 10 MG tablet TAKE 1 TABLET BY MOUTH DAILY. 30 tablet 5  . colchicine 0.6 MG tablet Take 1 tablet (0.6 mg total) by mouth daily. (Patient taking differently: Take 0.6 mg by mouth daily as needed. ) 30 tablet 1  . fexofenadine-pseudoephedrine (ALLEGRA-D) 60-120 MG per tablet Take 1 tablet by mouth 2 (two) times daily as needed (allergies).    . Fish Oil-Cholecalciferol (FISH OIL + D3) 1200-1000 MG-UNIT CAPS Take 1 capsule by mouth daily.    . fluticasone (FLONASE) 50 MCG/ACT nasal spray PLACE 2 SPRAYS INTO THE NOSE DAILY. 48 g 3  . HYDROcodone-homatropine (HYCODAN) 5-1.5 MG/5ML syrup Take 5 mLs by mouth every 8 (eight) hours as needed for cough. 120 mL 0  . lisinopril (PRINIVIL,ZESTRIL) 20 MG tablet TAKE 1 TABLET (20 MG TOTAL)  BY MOUTH DAILY. 90 tablet 3  . methylphenidate (RITALIN) 10 MG tablet Take 1 tablet (10 mg total) by mouth daily as needed. 30 tablet 0  . omeprazole (PRILOSEC) 20 MG capsule Take 1 capsule (20 mg total) by mouth daily. 90 capsule 1  . ondansetron (ZOFRAN) 4 MG tablet Take 1 tablet (4 mg total) by mouth every 8 (eight) hours as needed for nausea or vomiting. 20 tablet 0  . promethazine (PHENERGAN) 25 MG suppository Place 25 mg rectally every 6 (six) hours as needed for nausea.    . propranolol (INDERAL) 40 MG tablet TAKE 1 TABLET BY MOUTH DAILY. 90 tablet 3   No current facility-administered medications on file prior to visit.    No Known Allergies  Review of Systems  Review of Systems  Constitutional:  Negative for fever, chills and malaise/fatigue.  HENT: Negative for congestion, hearing loss and nosebleeds.   Eyes: Negative for discharge.  Respiratory: Negative for cough, sputum production, shortness of breath and wheezing.   Cardiovascular: Negative for chest pain, palpitations and leg swelling.  Gastrointestinal: Negative for heartburn, nausea, vomiting, abdominal pain, diarrhea, constipation and blood in stool.  Genitourinary: Negative for dysuria, urgency, frequency and hematuria.  Musculoskeletal: Negative for myalgias, back pain and falls.  Skin: Negative for rash.  Neurological: Negative for dizziness, tremors, sensory change, focal weakness, loss of consciousness, weakness and headaches.  Endo/Heme/Allergies: Negative for polydipsia. Does not bruise/bleed easily.  Psychiatric/Behavioral: Negative for depression and suicidal ideas. The patient is not nervous/anxious and does not have insomnia.     Objective  BP 102/72 mmHg  Pulse 61  Temp(Src) 97.7 F (36.5 C) (Oral)  Ht  (1.88 m)  Wt 196 lb 9.6 oz (89.177 kg)  BMI 25.23 kg/m2  SpO2 100%  Physical Exam  Physical Exam  Constitutional: He is oriented to person, place, and time and well-developed, well-nourished, and in no distress. No distress.  HENT:  Head: Normocephalic and atraumatic.  Eyes: Conjunctivae are normal.  Neck: Neck supple. No thyromegaly present.  Cardiovascular: Normal rate, regular rhythm and normal heart sounds.   No murmur heard. Pulmonary/Chest: Effort normal and breath sounds normal. No respiratory distress.  Abdominal: He exhibits no distension and no mass. There is no tenderness.  Musculoskeletal: He exhibits no edema.  Neurological: He is alert and oriented to person, place, and time.  Skin: Skin is warm.  Psychiatric: Memory, affect and judgment normal.    Lab Results  Component Value Date   TSH 0.563 02/17/2014   Lab Results  Component Value Date   WBC 9.6 02/17/2014   HGB  15.3 02/17/2014   HCT 45.1 02/17/2014   MCV 86.2 02/17/2014   PLT 301 02/17/2014   Lab Results  Component Value Date   CREATININE 1.02 02/17/2014   BUN 15 02/17/2014   NA 136 02/17/2014   K 5.2 02/17/2014   CL 102 02/17/2014   CO2 26 02/17/2014   Lab Results  Component Value Date   ALT 22 02/17/2014   AST 18 02/17/2014   ALKPHOS 45 02/17/2014   BILITOT 1.0 02/17/2014   Lab Results  Component Value Date   CHOL 131 02/17/2014   Lab Results  Component Value Date   HDL 50 02/17/2014   Lab Results  Component Value Date   LDLCALC 57 02/17/2014   Lab Results  Component Value Date   TRIG 121 02/17/2014   Lab Results  Component Value Date   CHOLHDL 2.6 02/17/2014     Assessment &  Plan  Hypertension Well controlled, no changes to meds. Encouraged heart healthy diet such as the DASH diet and exercise as tolerated.    Vitamin D deficiency Not taking any at this time. Check vitamin D level today   Insomnia Encouraged good sleep hygiene such as dark, quiet room. No blue/green glowing lights such as computer screens in bedroom. No alcohol or stimulants in evening. Cut down on caffeine as able. Regular exercise is helpful but not just prior to bed time. Try Melatonin 2-5 mg 1/2 to 1 hour prior to bedtime and can try Benadryl 25 mg with or without Melatonin   Hyperlipidemia Tolerating statin, encouraged heart healthy diet, avoid trans fats, minimize simple carbs and saturated fats. Increase exercise as tolerated   Gout No recent flares, tolerating the Allopurinol   Hyperglycemia minimize simple carbs. Increase exercise as tolerated. Check an A1C today   Nocturia Check PSA today   GERD (gastroesophageal reflux disease) Avoid offending foods, probiotics. Do not eat large meals in late evening and consider raising head of bed.    Testosterone deficiency Testosterone has dropped some, no change in meds for now, he has stopped his supplement and feels  well   Preventative health care Patient encouraged to maintain heart healthy diet, regular exercise, adequate sleep. Consider daily probiotics. Take medications as prescribed. Labs reviewed. Needs colonoscopy referred to gastroenterology

## 2014-08-25 NOTE — Assessment & Plan Note (Signed)
Not taking any at this time. Check vitamin D level today

## 2014-08-25 NOTE — Progress Notes (Signed)
Pre visit review using our clinic review tool, if applicable. No additional management support is needed unless otherwise documented below in the visit note. 

## 2014-08-26 LAB — TESTOSTERONE, FREE, TOTAL, SHBG
SEX HORMONE BINDING: 29 nmol/L (ref 22–77)
TESTOSTERONE-% FREE: 2 % (ref 1.6–2.9)
TESTOSTERONE: 202 ng/dL — AB (ref 300–890)
Testosterone, Free: 41.4 pg/mL — ABNORMAL LOW (ref 47.0–244.0)

## 2014-08-30 ENCOUNTER — Telehealth: Payer: Self-pay | Admitting: *Deleted

## 2014-08-30 NOTE — Telephone Encounter (Signed)
[facility]. Awaiting determination. [id number]

## 2014-09-01 ENCOUNTER — Encounter: Payer: Self-pay | Admitting: Internal Medicine

## 2014-09-04 NOTE — Assessment & Plan Note (Signed)
Testosterone has dropped some, no change in meds for now, he has stopped his supplement and feels well

## 2014-09-04 NOTE — Assessment & Plan Note (Addendum)
Patient encouraged to maintain heart healthy diet, regular exercise, adequate sleep. Consider daily probiotics. Take medications as prescribed. Labs reviewed. Needs colonoscopy referred to gastroenterology

## 2014-09-04 NOTE — Assessment & Plan Note (Signed)
Avoid offending foods, probiotics. Do not eat large meals in late evening and consider raising head of bed.  

## 2014-09-21 ENCOUNTER — Encounter: Payer: Self-pay | Admitting: Family Medicine

## 2014-09-26 ENCOUNTER — Other Ambulatory Visit: Payer: Self-pay | Admitting: Family Medicine

## 2014-09-30 ENCOUNTER — Ambulatory Visit: Payer: BC Managed Care – PPO | Admitting: Family Medicine

## 2014-10-04 ENCOUNTER — Encounter: Payer: Self-pay | Admitting: Family Medicine

## 2014-10-04 ENCOUNTER — Ambulatory Visit (INDEPENDENT_AMBULATORY_CARE_PROVIDER_SITE_OTHER): Payer: BC Managed Care – PPO | Admitting: Family Medicine

## 2014-10-04 VITALS — BP 114/74 | HR 75 | Temp 98.1°F | Ht 74.0 in | Wt 202.1 lb

## 2014-10-04 DIAGNOSIS — R41840 Attention and concentration deficit: Secondary | ICD-10-CM | POA: Diagnosis not present

## 2014-10-04 DIAGNOSIS — K219 Gastro-esophageal reflux disease without esophagitis: Secondary | ICD-10-CM

## 2014-10-04 DIAGNOSIS — E291 Testicular hypofunction: Secondary | ICD-10-CM | POA: Diagnosis not present

## 2014-10-04 DIAGNOSIS — I10 Essential (primary) hypertension: Secondary | ICD-10-CM | POA: Diagnosis not present

## 2014-10-04 DIAGNOSIS — E559 Vitamin D deficiency, unspecified: Secondary | ICD-10-CM

## 2014-10-04 DIAGNOSIS — E785 Hyperlipidemia, unspecified: Secondary | ICD-10-CM

## 2014-10-04 DIAGNOSIS — E349 Endocrine disorder, unspecified: Secondary | ICD-10-CM

## 2014-10-04 DIAGNOSIS — M109 Gout, unspecified: Secondary | ICD-10-CM

## 2014-10-04 DIAGNOSIS — E782 Mixed hyperlipidemia: Secondary | ICD-10-CM | POA: Diagnosis not present

## 2014-10-04 MED ORDER — PROPRANOLOL HCL 40 MG PO TABS: 40.0000 mg | ORAL_TABLET | Freq: Every day | ORAL | Status: DC

## 2014-10-04 MED ORDER — LISINOPRIL 20 MG PO TABS: ORAL_TABLET | ORAL | Status: DC

## 2014-10-04 MED ORDER — METHYLPHENIDATE HCL 10 MG PO TABS: 10.0000 mg | ORAL_TABLET | Freq: Every day | ORAL | Status: DC | PRN

## 2014-10-04 NOTE — Patient Instructions (Signed)

## 2014-10-04 NOTE — Progress Notes (Signed)
Pre visit review using our clinic review tool, if applicable. No additional management support is needed unless otherwise documented below in the visit note. 

## 2014-10-12 ENCOUNTER — Encounter: Payer: Self-pay | Admitting: Family Medicine

## 2014-10-12 NOTE — Assessment & Plan Note (Signed)
Testosterone mildly depressed, encouraged increased exercise and we will recheckin 3-4 months.

## 2014-10-12 NOTE — Assessment & Plan Note (Signed)
Tolerating statin, encouraged heart healthy diet, avoid trans fats, minimize simple carbs and saturated fats. Increase exercise as tolerated 

## 2014-10-12 NOTE — Assessment & Plan Note (Signed)
Normal vitamin d with labwork today

## 2014-10-12 NOTE — Assessment & Plan Note (Signed)
Well controlled, no changes to meds. Encouraged heart healthy diet such as the DASH diet and exercise as tolerated.  °

## 2014-10-12 NOTE — Assessment & Plan Note (Signed)
Avoid offending foods, start probiotics. Do not eat large meals in late evening and consider raising head of bed.  

## 2014-10-12 NOTE — Assessment & Plan Note (Signed)
Well controlled on Allopurinol. No changes

## 2014-10-12 NOTE — Progress Notes (Signed)
Patient ID: Taylor Mcdonald, male   DOB: 1955/11/23, 59 y.o.   MRN: [id number]   ANIK WESCH  [id number] 23-Feb-1956 10/12/2014      Progress Note-Follow Up  Subjective  Chief Complaint  Chief Complaint  Patient presents with  . Follow-up    HPI  Patient is a 59 y.o. male in today for routine medical care. Doing well no recent illness. Denies excessive fatigue or malaise. Denies CP/palp/SOB/HA/congestion/fevers/GI or GU c/o. Taking meds as prescribed. Is using Adderall infrequently with good results, requesting refill  Past Medical History  Diagnosis Date  . Gout   . Hypertension   . Low testosterone   . Migraine   . Allergy   . RLS (restless legs syndrome)   . Insomnia   . Hyperlipidemia   . GERD (gastroesophageal reflux disease)   . Hyperglycemia   . Fatigue   . Pain of left heel 10/01/2011  . Vitamin D deficiency 12/23/2011  . Sinusitis 03/04/2012  . Anxiety 03/04/2012  . Preventative health care 07/24/2012  . Lower back pain 09/09/2012  . Vasovagal near syncope 04/30/2013    Past Surgical History  Procedure Laterality Date  . Tonsillectomy    . Panendoscopy    . Lumbar laminectomy/decompression microdiscectomy N/A 05/13/2013    Procedure: LUMBAR LAMINECTOMY/DECOMPRESSION MICRODISCECTOMY 1 LEVEL L3-4;  Surgeon: Javier Docker, MD;  Location: [facility];  Service: Orthopedics;  Laterality: N/A;    Family History  Problem Relation Age of Onset  . Obesity Mother   . Hypertension Son   . Hypertension Maternal Grandmother   . Obesity Maternal Grandmother   . Cancer Maternal Grandmother 50    intestinal  . Dementia Maternal Grandfather     History   Social History  . Marital Status: Married    Spouse Name: N/A  . Number of Children: N/A  . Years of Education: N/A   Occupational History  . Not on file.   Social History Main Topics  . Smoking status: Never Smoker   . Smokeless tobacco: Never Used  . Alcohol Use: No  . Drug Use: Yes    Special: Hydrocodone   . Sexual Activity: Yes   Other Topics Concern  . Not on file   Social History Narrative    Current Outpatient Prescriptions on File Prior to Visit  Medication Sig Dispense Refill  . allopurinol (ZYLOPRIM) 100 MG tablet TAKE 2 TABLETS (200 MG TOTAL) BY MOUTH DAILY. 60 tablet 5  . aspirin 81 MG tablet Take 1 tablet (81 mg total) by mouth daily. Resume 4 days post-op 30 tablet   . atorvastatin (LIPITOR) 10 MG tablet TAKE 1 TABLET BY MOUTH DAILY. 30 tablet 11  . colchicine 0.6 MG tablet Take 1 tablet (0.6 mg total) by mouth daily. (Patient taking differently: Take 0.6 mg by mouth daily as needed. ) 30 tablet 1  . fexofenadine-pseudoephedrine (ALLEGRA-D) 60-120 MG per tablet Take 1 tablet by mouth 2 (two) times daily as needed (allergies).    . Fish Oil-Cholecalciferol (FISH OIL + D3) 1200-1000 MG-UNIT CAPS Take 1 capsule by mouth daily.    . fluticasone (FLONASE) 50 MCG/ACT nasal spray PLACE 2 SPRAYS INTO THE NOSE DAILY. 48 g 3  . omeprazole (PRILOSEC) 20 MG capsule Take 1 capsule (20 mg total) by mouth daily. 90 capsule 1  . ondansetron (ZOFRAN) 4 MG tablet Take 1 tablet (4 mg total) by mouth every 8 (eight) hours as needed for nausea or vomiting. 20 tablet 0  . promethazine (PHENERGAN)  25 MG suppository Place 25 mg rectally every 6 (six) hours as needed for nausea.     No current facility-administered medications on file prior to visit.    No Known Allergies  Review of Systems  Review of Systems  Constitutional: Negative for fever and malaise/fatigue.  HENT: Negative for congestion.   Eyes: Negative for discharge.  Respiratory: Negative for shortness of breath.   Cardiovascular: Negative for chest pain, palpitations and leg swelling.  Gastrointestinal: Negative for nausea, abdominal pain and diarrhea.  Genitourinary: Negative for dysuria.  Musculoskeletal: Negative for falls.  Skin: Negative for rash.  Neurological: Negative for loss of consciousness and headaches.    Endo/Heme/Allergies: Negative for polydipsia.  Psychiatric/Behavioral: Negative for depression and suicidal ideas. The patient is not nervous/anxious and does not have insomnia.     Objective  BP 114/74 mmHg  Pulse 75  Temp(Src) 98.1 F (36.7 C) (Oral)  Ht 6\' 2"  (1.88 m)  Wt 202 lb 2 oz (91.683 kg)  BMI 25.94 kg/m2  SpO2 97%  Physical Exam  Physical Exam  Constitutional: He is oriented to person, place, and time and well-developed, well-nourished, and in no distress. No distress.  HENT:  Head: Normocephalic and atraumatic.  Eyes: Conjunctivae are normal.  Neck: Neck supple. No thyromegaly present.  Cardiovascular: Normal rate, regular rhythm, normal heart sounds and intact distal pulses.   No murmur heard. Pulmonary/Chest: Effort normal and breath sounds normal. No respiratory distress.  Abdominal: He exhibits no distension and no mass. There is no tenderness.  Musculoskeletal: He exhibits no edema.  Neurological: He is alert and oriented to person, place, and time.  Skin: Skin is warm.  Psychiatric: Memory, affect and judgment normal.    Lab Results  Component Value Date   TSH 0.58 08/25/2014   Lab Results  Component Value Date   WBC 8.1 08/25/2014   HGB 15.3 08/25/2014   HCT 44.7 08/25/2014   MCV 85.5 08/25/2014   PLT 271.0 08/25/2014   Lab Results  Component Value Date   CREATININE 0.93 08/25/2014   BUN 20 08/25/2014   NA 137 08/25/2014   K 4.9 08/25/2014   CL 102 08/25/2014   CO2 27 08/25/2014   Lab Results  Component Value Date   ALT 25 08/25/2014   AST 20 08/25/2014   ALKPHOS 53 08/25/2014   BILITOT 0.8 08/25/2014   Lab Results  Component Value Date   CHOL 146 08/25/2014   Lab Results  Component Value Date   HDL 51.00 08/25/2014   Lab Results  Component Value Date   LDLCALC 65 08/25/2014   Lab Results  Component Value Date   TRIG 149.0 08/25/2014   Lab Results  Component Value Date   CHOLHDL 3 08/25/2014     Assessment &  Plan  Hypertension Well controlled, no changes to meds. Encouraged heart healthy diet such as the DASH diet and exercise as tolerated.    GERD (gastroesophageal reflux disease) Avoid offending foods, start probiotics. Do not eat large meals in late evening and consider raising head of bed.    Vitamin D deficiency Normal vitamin d with labwork today   Hyperlipidemia Tolerating statin, encouraged heart healthy diet, avoid trans fats, minimize simple carbs and saturated fats. Increase exercise as tolerated   Gout Well controlled on Allopurinol. No changes   Testosterone deficiency Testosterone mildly depressed, encouraged increased exercise and we will recheckin 3-4 months.

## 2014-11-03 ENCOUNTER — Encounter: Payer: Self-pay | Admitting: Internal Medicine

## 2014-12-06 ENCOUNTER — Other Ambulatory Visit: Payer: Self-pay | Admitting: Family Medicine

## 2014-12-30 ENCOUNTER — Other Ambulatory Visit: Payer: BC Managed Care – PPO

## 2015-01-06 ENCOUNTER — Ambulatory Visit: Payer: BC Managed Care – PPO | Admitting: Family Medicine

## 2015-02-03 ENCOUNTER — Other Ambulatory Visit (INDEPENDENT_AMBULATORY_CARE_PROVIDER_SITE_OTHER): Payer: BC Managed Care – PPO

## 2015-02-03 DIAGNOSIS — I10 Essential (primary) hypertension: Secondary | ICD-10-CM

## 2015-02-03 DIAGNOSIS — E291 Testicular hypofunction: Secondary | ICD-10-CM

## 2015-02-03 DIAGNOSIS — E782 Mixed hyperlipidemia: Secondary | ICD-10-CM

## 2015-02-03 DIAGNOSIS — E349 Endocrine disorder, unspecified: Secondary | ICD-10-CM

## 2015-02-03 DIAGNOSIS — R41840 Attention and concentration deficit: Secondary | ICD-10-CM | POA: Diagnosis not present

## 2015-02-03 LAB — COMPREHENSIVE METABOLIC PANEL
ALBUMIN: 4.2 g/dL (ref 3.5–5.2)
ALT: 23 U/L (ref 0–53)
AST: 22 U/L (ref 0–37)
Alkaline Phosphatase: 47 U/L (ref 39–117)
BILIRUBIN TOTAL: 0.8 mg/dL (ref 0.2–1.2)
BUN: 19 mg/dL (ref 6–23)
CO2: 25 meq/L (ref 19–32)
Calcium: 9.5 mg/dL (ref 8.4–10.5)
Chloride: 104 mEq/L (ref 96–112)
Creatinine, Ser: 1 mg/dL (ref 0.40–1.50)
GFR: 80.88 mL/min (ref >60.00)
GLUCOSE: 106 mg/dL — AB (ref 70–99)
POTASSIUM: 4.2 meq/L (ref 3.5–5.1)
SODIUM: 138 meq/L (ref 135–145)
TOTAL PROTEIN: 6.7 g/dL (ref 6.0–8.3)

## 2015-02-03 LAB — CBC
HEMATOCRIT: 42 % (ref 39.0–52.0)
HEMOGLOBIN: 14.1 g/dL (ref 13.0–17.0)
MCHC: 33.7 g/dL (ref 30.0–36.0)
MCV: 86.8 fl (ref 78.0–100.0)
PLATELETS: 272 10*3/uL (ref 150.0–400.0)
RBC: 4.83 Mil/uL (ref 4.22–5.81)
RDW: 14.4 % (ref 11.5–15.5)
WBC: 7.2 10*3/uL (ref 4.0–10.5)

## 2015-02-03 LAB — LIPID PANEL
Cholesterol: 144 mg/dL (ref 0–200)
HDL: 46 mg/dL (ref >39.00)
LDL Cholesterol: 74 mg/dL (ref 0–99)
NonHDL: 98
TRIGLYCERIDES: 122 mg/dL (ref 0.0–149.0)
Total CHOL/HDL Ratio: 3
VLDL: 24.4 mg/dL (ref 0.0–40.0)

## 2015-02-03 LAB — TSH: TSH: 0.47 u[IU]/mL (ref 0.35–4.50)

## 2015-02-03 LAB — TESTOSTERONE: Testosterone: 217.46 ng/dL — ABNORMAL LOW (ref 300.00–890.00)

## 2015-02-14 ENCOUNTER — Encounter: Payer: Self-pay | Admitting: Family Medicine

## 2015-02-14 ENCOUNTER — Ambulatory Visit (INDEPENDENT_AMBULATORY_CARE_PROVIDER_SITE_OTHER): Payer: BC Managed Care – PPO | Admitting: Family Medicine

## 2015-02-14 VITALS — BP 116/84 | HR 67 | Temp 98.0°F | Ht 74.0 in | Wt 199.2 lb

## 2015-02-14 DIAGNOSIS — E291 Testicular hypofunction: Secondary | ICD-10-CM

## 2015-02-14 DIAGNOSIS — E349 Endocrine disorder, unspecified: Secondary | ICD-10-CM

## 2015-02-14 DIAGNOSIS — E785 Hyperlipidemia, unspecified: Secondary | ICD-10-CM | POA: Diagnosis not present

## 2015-02-14 DIAGNOSIS — I10 Essential (primary) hypertension: Secondary | ICD-10-CM

## 2015-02-14 DIAGNOSIS — R739 Hyperglycemia, unspecified: Secondary | ICD-10-CM | POA: Diagnosis not present

## 2015-02-14 DIAGNOSIS — K219 Gastro-esophageal reflux disease without esophagitis: Secondary | ICD-10-CM

## 2015-02-14 MED ORDER — TESTOSTERONE CYPIONATE 200 MG/ML IM SOLN: 100.0000 mg | INTRAMUSCULAR | Status: DC

## 2015-02-14 MED ORDER — ZOLPIDEM TARTRATE 10 MG PO TABS: 10.0000 mg | ORAL_TABLET | Freq: Every evening | ORAL | Status: DC | PRN

## 2015-02-14 NOTE — Progress Notes (Signed)
Pre visit review using our clinic review tool, if applicable. No additional management support is needed unless otherwise documented below in the visit note. 

## 2015-02-14 NOTE — Progress Notes (Signed)
Taylor Mcdonald  [id number] 23-Oct-1955 02/14/2015      Progress Note-Follow Up  Subjective  Chief Complaint  Chief Complaint  Patient presents with  . Follow-up    4 months    HPI  Patient is a 59 y.o. male in today for routine medical care. Patient in for routine follow up. Doing well. Does struggle with ongoing fatigue and ED. No recent illness. No hospitalizations. Denies CP/palp/SOB/HA/congestion/fevers/GI or GU c/o. Taking meds as prescribed  Past Medical History  Diagnosis Date  . Gout   . Hypertension   . Low testosterone   . Migraine   . Allergy   . RLS (restless legs syndrome)   . Insomnia   . Hyperlipidemia   . GERD (gastroesophageal reflux disease)   . Hyperglycemia   . Fatigue   . Pain of left heel 10/01/2011  . Vitamin D deficiency 12/23/2011  . Sinusitis 03/04/2012  . Anxiety 03/04/2012  . Preventative health care 07/24/2012  . Lower back pain 09/09/2012  . Vasovagal near syncope 04/30/2013    Past Surgical History  Procedure Laterality Date  . Tonsillectomy    . Panendoscopy    . Lumbar laminectomy/decompression microdiscectomy N/A 05/13/2013    Procedure: LUMBAR LAMINECTOMY/DECOMPRESSION MICRODISCECTOMY 1 LEVEL L3-4;  Surgeon: Javier DockerJeffrey C Beane, MD;  Location: [facility];  Service: Orthopedics;  Laterality: N/A;    Family History  Problem Relation Age of Onset  . Obesity Mother   . Hypertension Son   . Hypertension Maternal Grandmother   . Obesity Maternal Grandmother   . Cancer Maternal Grandmother 50    intestinal  . Dementia Maternal Grandfather     History   Social History  . Marital Status: Married    Spouse Name: N/A  . Number of Children: N/A  . Years of Education: N/A   Occupational History  . Not on file.   Social History Main Topics  . Smoking status: Never Smoker   . Smokeless tobacco: Never Used  . Alcohol Use: No  . Drug Use: Yes    Special: Hydrocodone  . Sexual Activity: Yes   Other Topics Concern  . Not on file    Social History Narrative    Current Outpatient Prescriptions on File Prior to Visit  Medication Sig Dispense Refill  . allopurinol (ZYLOPRIM) 100 MG tablet TAKE 2 TABLETS (200 MG TOTAL) BY MOUTH DAILY. 60 tablet 5  . aspirin 81 MG tablet Take 1 tablet (81 mg total) by mouth daily. Resume 4 days post-op 30 tablet   . atorvastatin (LIPITOR) 10 MG tablet TAKE 1 TABLET BY MOUTH DAILY. 30 tablet 11  . colchicine 0.6 MG tablet Take 1 tablet (0.6 mg total) by mouth daily. (Patient taking differently: Take 0.6 mg by mouth daily as needed. ) 30 tablet 1  . fexofenadine-pseudoephedrine (ALLEGRA-D) 60-120 MG per tablet Take 1 tablet by mouth 2 (two) times daily as needed (allergies).    . Fish Oil-Cholecalciferol (FISH OIL + D3) 1200-1000 MG-UNIT CAPS Take 1 capsule by mouth daily.    . fluticasone (FLONASE) 50 MCG/ACT nasal spray PLACE 2 SPRAYS INTO THE NOSE DAILY. 48 g 3  . lisinopril (PRINIVIL,ZESTRIL) 20 MG tablet TAKE 1 TABLET (20 MG TOTAL) BY MOUTH DAILY. 90 tablet 3  . methylphenidate (RITALIN) 10 MG tablet Take 1 tablet (10 mg total) by mouth daily as needed. 30 tablet 0  . omeprazole (PRILOSEC) 20 MG capsule TAKE 1 CAPSULE (20 MG TOTAL) BY MOUTH DAILY. 90 capsule 2  . ondansetron (  ZOFRAN) 4 MG tablet Take 1 tablet (4 mg total) by mouth every 8 (eight) hours as needed for nausea or vomiting. 20 tablet 0  . promethazine (PHENERGAN) 25 MG suppository Place 25 mg rectally every 6 (six) hours as needed for nausea.    . propranolol (INDERAL) 40 MG tablet Take 1 tablet (40 mg total) by mouth daily. 90 tablet 3   No current facility-administered medications on file prior to visit.    No Known Allergies  Review of Systems  Review of Systems  Constitutional: Positive for malaise/fatigue. Negative for fever.  HENT: Negative for congestion.   Eyes: Negative for discharge.  Respiratory: Negative for shortness of breath.   Cardiovascular: Negative for chest pain, palpitations and leg swelling.   Gastrointestinal: Negative for nausea, abdominal pain and diarrhea.  Genitourinary: Negative for dysuria.  Musculoskeletal: Positive for joint pain. Negative for falls.  Skin: Negative for rash.  Neurological: Negative for loss of consciousness and headaches.  Endo/Heme/Allergies: Negative for polydipsia.  Psychiatric/Behavioral: Negative for depression and suicidal ideas. The patient is not nervous/anxious and does not have insomnia.     Objective  BP 116/84 mmHg  Pulse 67  Temp(Src) 98 F (36.7 C) (Oral)  Ht 6\' 2"  (1.88 m)  Wt 199 lb 3.2 oz (90.357 kg)  BMI 25.57 kg/m2  SpO2 100%  Physical Exam  Physical Exam  Constitutional: He is oriented to person, place, and time and well-developed, well-nourished, and in no distress. No distress.  HENT:  Head: Normocephalic and atraumatic.  Eyes: Conjunctivae are normal.  Neck: Neck supple. No thyromegaly present.  Cardiovascular: Normal rate, regular rhythm and normal heart sounds.   No murmur heard. Pulmonary/Chest: Effort normal and breath sounds normal. No respiratory distress.  Abdominal: He exhibits no distension and no mass. There is no tenderness.  Musculoskeletal: He exhibits no edema.  Neurological: He is alert and oriented to person, place, and time.  Skin: Skin is warm.  Psychiatric: Memory, affect and judgment normal.    Lab Results  Component Value Date   TSH 0.47 02/03/2015   Lab Results  Component Value Date   WBC 7.2 02/03/2015   HGB 14.1 02/03/2015   HCT 42.0 02/03/2015   MCV 86.8 02/03/2015   PLT 272.0 02/03/2015   Lab Results  Component Value Date   CREATININE 1.00 02/03/2015   BUN 19 02/03/2015   NA 138 02/03/2015   K 4.2 02/03/2015   CL 104 02/03/2015   CO2 25 02/03/2015   Lab Results  Component Value Date   ALT 23 02/03/2015   AST 22 02/03/2015   ALKPHOS 47 02/03/2015   BILITOT 0.8 02/03/2015   Lab Results  Component Value Date   CHOL 144 02/03/2015   Lab Results  Component Value  Date   HDL 46.00 02/03/2015   Lab Results  Component Value Date   LDLCALC 74 02/03/2015   Lab Results  Component Value Date   TRIG 122.0 02/03/2015   Lab Results  Component Value Date   CHOLHDL 3 02/03/2015     Assessment & Plan  Hyperglycemia  minimize simple carbs. Increase exercise as tolerated.  Testosterone deficiency Improving will continue to monitor and will begin supplements again, he declines topical treatments and would like to stay on shots.   Hypertension Well controlled, no changes to meds. Encouraged heart healthy diet such as the DASH diet and exercise as tolerated.   GERD (gastroesophageal reflux disease) Avoid offending foods, take probiotics. Do not eat large meals in late  evening and consider raising head of bed.   Hyperlipidemia Tolerating statin, encouraged heart healthy diet, avoid trans fats, minimize simple carbs and saturated fats. Increase exercise as tolerated

## 2015-02-14 NOTE — Patient Instructions (Addendum)
Vitamin D 2000 IU caps daily Probiotics daily [employer] or [facility] or online at [email or web address] NOW company 10 strain version daily   Testosterone injection What is this medicine? TESTOSTERONE (tes TOS ter one) is the main male hormone. It supports normal male development such as muscle growth, facial hair, and deep voice. It is used in males to treat low testosterone levels. This medicine may be used for other purposes; ask your health care provider or pharmacist if you have questions. COMMON BRAND NAME(S): Andro-L.A., Aveed, Delatestryl, Depo-Testosterone, Virilon What should I tell my health care provider before I take this medicine? They need to know if you have any of these conditions: -breast cancer -diabetes -heart disease -kidney disease -liver disease -lung disease -prostate cancer, enlargement -an unusual or allergic reaction to testosterone, other medicines, foods, dyes, or preservatives -pregnant or trying to get pregnant -breast-feeding How should I use this medicine? This medicine is for injection into a muscle. It is usually given by a health care professional in a hospital or clinic setting. Contact your pediatrician regarding the use of this medicine in children. While this medicine may be prescribed for children as young as 23 years of age for selected conditions, precautions do apply. Overdosage: If you think you have taken too much of this medicine contact a poison control center or emergency room at once. NOTE: This medicine is only for you. Do not share this medicine with others. What if I miss a dose? Try not to miss a dose. Your doctor or health care professional will tell you when your next injection is due. Notify the office if you are unable to keep an appointment. What may interact with this medicine? -medicines for diabetes -medicines that treat or prevent blood clots like warfarin -oxyphenbutazone -propranolol -steroid medicines  like prednisone or cortisone This list may not describe all possible interactions. Give your health care provider a list of all the medicines, herbs, non-prescription drugs, or dietary supplements you use. Also tell them if you smoke, drink alcohol, or use illegal drugs. Some items may interact with your medicine. What should I watch for while using this medicine? Visit your doctor or health care professional for regular checks on your progress. They will need to check the level of testosterone in your blood. This medicine is only approved for use in men who have low levels of testosterone related to certain medical conditions. Heart attacks and strokes have been reported with the use of this medicine. Notify your doctor or health care professional and seek emergency treatment if you develop breathing problems; changes in vision; confusion; chest pain or chest tightness; sudden arm pain; severe, sudden headache; trouble speaking or understanding; sudden numbness or weakness of the face, arm or leg; loss of balance or coordination. Talk to your doctor about the risks and benefits of this medicine. This medicine may affect blood sugar levels. If you have diabetes, check with your doctor or health care professional before you change your diet or the dose of your diabetic medicine. This drug is banned from use in athletes by most athletic organizations. What side effects may I notice from receiving this medicine? Side effects that you should report to your doctor or health care professional as soon as possible: -allergic reactions like skin rash, itching or hives, swelling of the face, lips, or tongue -breast enlargement -breathing problems -changes in mood, especially anger, depression, or rage -dark urine -general ill feeling or flu-like symptoms -light-colored stools -loss of appetite, nausea -nausea,  vomiting -right upper belly pain -stomach pain -swelling of ankles -too frequent or persistent  erections -trouble passing urine or change in the amount of urine -unusually weak or tired -yellowing of the eyes or skin Additional side effects that can occur in women include: -deep or hoarse voice -facial hair growth -irregular menstrual periods Side effects that usually do not require medical attention (report to your doctor or health care professional if they continue or are bothersome): -acne -change in sex drive or performance -hair loss -headache This list may not describe all possible side effects. Call your doctor for medical advice about side effects. You may report side effects to FDA at 1-800-FDA-1088. Where should I keep my medicine? Keep out of the reach of children. This medicine can be abused. Keep your medicine in a safe place to protect it from theft. Do not share this medicine with anyone. Selling or giving away this medicine is dangerous and against the law. Store at room temperature between 20 and 25 degrees C (68 and 77 degrees F). Do not freeze. Protect from light. Follow the directions for the product you are prescribed. Throw away any unused medicine after the expiration date. NOTE: This sheet is a summary. It may not cover all possible information. If you have questions about this medicine, talk to your doctor, pharmacist, or health care provider.  2015, Elsevier/Gold Standard. (2013-09-30 [id number]:[id number])

## 2015-02-27 NOTE — Assessment & Plan Note (Signed)
Avoid offending foods, take probiotics. Do not eat large meals in late evening and consider raising head of bed.  

## 2015-02-27 NOTE — Assessment & Plan Note (Signed)
Tolerating statin, encouraged heart healthy diet, avoid trans fats, minimize simple carbs and saturated fats. Increase exercise as tolerated 

## 2015-02-27 NOTE — Assessment & Plan Note (Addendum)
Improving will continue to monitor and will begin supplements again, he declines topical treatments and would like to stay on shots.

## 2015-02-27 NOTE — Assessment & Plan Note (Signed)
Well controlled, no changes to meds. Encouraged heart healthy diet such as the DASH diet and exercise as tolerated.  °

## 2015-02-27 NOTE — Assessment & Plan Note (Signed)
minimize simple carbs. Increase exercise as tolerated.  

## 2015-03-09 ENCOUNTER — Telehealth: Payer: Self-pay | Admitting: *Deleted

## 2015-03-09 NOTE — Telephone Encounter (Signed)
Prior auth for zolpidem tartrate initiated. Awaiting determination. JG//CMA

## 2015-03-16 ENCOUNTER — Telehealth: Payer: Self-pay | Admitting: Family Medicine

## 2015-03-16 NOTE — Telephone Encounter (Signed)
Caller name: Phoebe Sharps  Relation to pt: [facility]  Call back number: [phone]   Reason for call:  As per Phoebe Sharps from pharmacy called in insurance directly to find out the status due to patient inquiring and patient insurance states   zolpidem (AMBIEN) 10 MG tablet Pa was started on 03/09/15 but never finished and testosterone cypionate (DEPO-TESTOSTERONE) 200 MG/ML injection was never intiated  [employer] Prior-Auth direct line [phone]

## 2015-03-16 NOTE — Telephone Encounter (Signed)
Zolpidem approved. Testosterone processing

## 2015-03-22 NOTE — Telephone Encounter (Signed)
PA for testosterone approved effective 02/18/2015 - 03/19/2016. Case [id number]. Approval letter sent for scanning. JG//CMA

## 2015-03-24 NOTE — Telephone Encounter (Signed)
Received additional fax today from [employer] stating that the testosterone is approved from 01/21/2015 - 09/18/2015 (which differs from dates below). [id number]. Sent for scanning. JG//CMA

## 2015-05-11 ENCOUNTER — Other Ambulatory Visit: Payer: BC Managed Care – PPO

## 2015-05-12 ENCOUNTER — Other Ambulatory Visit: Payer: BC Managed Care – PPO

## 2015-05-18 ENCOUNTER — Other Ambulatory Visit: Payer: BC Managed Care – PPO

## 2015-05-22 ENCOUNTER — Other Ambulatory Visit (INDEPENDENT_AMBULATORY_CARE_PROVIDER_SITE_OTHER): Payer: BC Managed Care – PPO

## 2015-05-22 DIAGNOSIS — E785 Hyperlipidemia, unspecified: Secondary | ICD-10-CM

## 2015-05-22 DIAGNOSIS — R739 Hyperglycemia, unspecified: Secondary | ICD-10-CM | POA: Diagnosis not present

## 2015-05-22 DIAGNOSIS — E291 Testicular hypofunction: Secondary | ICD-10-CM

## 2015-05-22 DIAGNOSIS — E349 Endocrine disorder, unspecified: Secondary | ICD-10-CM

## 2015-05-22 DIAGNOSIS — I10 Essential (primary) hypertension: Secondary | ICD-10-CM

## 2015-05-22 LAB — COMPREHENSIVE METABOLIC PANEL
ALBUMIN: 4 g/dL (ref 3.5–5.2)
ALK PHOS: 44 U/L (ref 39–117)
ALT: 25 U/L (ref 0–53)
AST: 26 U/L (ref 0–37)
BUN: 21 mg/dL (ref 6–23)
CALCIUM: 9.4 mg/dL (ref 8.4–10.5)
CHLORIDE: 104 meq/L (ref 96–112)
CO2: 28 mEq/L (ref 19–32)
CREATININE: 1.21 mg/dL (ref 0.40–1.50)
GFR: 64.84 mL/min (ref >60.00)
Glucose, Bld: 112 mg/dL — ABNORMAL HIGH (ref 70–99)
Potassium: 4.1 mEq/L (ref 3.5–5.1)
SODIUM: 138 meq/L (ref 135–145)
TOTAL PROTEIN: 6.4 g/dL (ref 6.0–8.3)
Total Bilirubin: 0.6 mg/dL (ref 0.2–1.2)

## 2015-05-22 LAB — CBC
HCT: 45.4 % (ref 39.0–52.0)
HEMOGLOBIN: 14.9 g/dL (ref 13.0–17.0)
MCHC: 32.9 g/dL (ref 30.0–36.0)
MCV: 86.6 fl (ref 78.0–100.0)
Platelets: 236 10*3/uL (ref 150.0–400.0)
RBC: 5.24 Mil/uL (ref 4.22–5.81)
RDW: 14.4 % (ref 11.5–15.5)
WBC: 8.5 10*3/uL (ref 4.0–10.5)

## 2015-05-22 LAB — LIPID PANEL
CHOLESTEROL: 117 mg/dL (ref 0–200)
HDL: 40.2 mg/dL (ref >39.00)
LDL CALC: 51 mg/dL (ref 0–99)
NonHDL: 77.07
TRIGLYCERIDES: 128 mg/dL (ref 0.0–149.0)
Total CHOL/HDL Ratio: 3
VLDL: 25.6 mg/dL (ref 0.0–40.0)

## 2015-05-22 LAB — TESTOSTERONE: TESTOSTERONE: 222.78 ng/dL — AB (ref 300.00–890.00)

## 2015-05-22 LAB — VITAMIN D 25 HYDROXY (VIT D DEFICIENCY, FRACTURES): VITD: 37.09 ng/mL (ref 30.00–100.00)

## 2015-05-22 LAB — TSH: TSH: 0.82 u[IU]/mL (ref 0.35–4.50)

## 2015-05-22 LAB — HEMOGLOBIN A1C: HEMOGLOBIN A1C: 5.2 % (ref 4.6–6.5)

## 2015-05-25 ENCOUNTER — Ambulatory Visit (INDEPENDENT_AMBULATORY_CARE_PROVIDER_SITE_OTHER): Payer: BC Managed Care – PPO | Admitting: Family Medicine

## 2015-05-25 ENCOUNTER — Encounter: Payer: Self-pay | Admitting: Family Medicine

## 2015-05-25 VITALS — BP 111/77 | HR 71 | Temp 98.0°F | Ht 74.0 in | Wt 202.4 lb

## 2015-05-25 DIAGNOSIS — E291 Testicular hypofunction: Secondary | ICD-10-CM | POA: Diagnosis not present

## 2015-05-25 DIAGNOSIS — Z1211 Encounter for screening for malignant neoplasm of colon: Secondary | ICD-10-CM

## 2015-05-25 DIAGNOSIS — R41840 Attention and concentration deficit: Secondary | ICD-10-CM | POA: Diagnosis not present

## 2015-05-25 DIAGNOSIS — R7989 Other specified abnormal findings of blood chemistry: Secondary | ICD-10-CM

## 2015-05-25 DIAGNOSIS — E782 Mixed hyperlipidemia: Secondary | ICD-10-CM | POA: Diagnosis not present

## 2015-05-25 DIAGNOSIS — E785 Hyperlipidemia, unspecified: Secondary | ICD-10-CM

## 2015-05-25 DIAGNOSIS — G4700 Insomnia, unspecified: Secondary | ICD-10-CM

## 2015-05-25 DIAGNOSIS — E349 Endocrine disorder, unspecified: Secondary | ICD-10-CM

## 2015-05-25 DIAGNOSIS — K219 Gastro-esophageal reflux disease without esophagitis: Secondary | ICD-10-CM

## 2015-05-25 DIAGNOSIS — I10 Essential (primary) hypertension: Secondary | ICD-10-CM

## 2015-05-25 DIAGNOSIS — R739 Hyperglycemia, unspecified: Secondary | ICD-10-CM

## 2015-05-25 MED ORDER — TESTOSTERONE CYPIONATE 200 MG/ML IM SOLN: 200.0000 mg | INTRAMUSCULAR | Status: DC

## 2015-05-25 MED ORDER — METHYLPHENIDATE HCL 10 MG PO TABS: 10.0000 mg | ORAL_TABLET | Freq: Every day | ORAL | Status: DC | PRN

## 2015-05-25 NOTE — Patient Instructions (Signed)

## 2015-05-25 NOTE — Progress Notes (Signed)
Pre visit review using our clinic review tool, if applicable. No additional management support is needed unless otherwise documented below in the visit note. 

## 2015-06-04 NOTE — Assessment & Plan Note (Signed)
Tolerating statin, encouraged heart healthy diet, avoid trans fats, minimize simple carbs and saturated fats. Increase exercise as tolerated 

## 2015-06-04 NOTE — Assessment & Plan Note (Signed)
Well controlled, no changes to meds. Encouraged heart healthy diet such as the DASH diet and exercise as tolerated.  °

## 2015-06-04 NOTE — Assessment & Plan Note (Signed)
Encouraged good sleep hygiene such as dark, quiet room. No blue/green glowing lights such as computer screens in bedroom. No alcohol or stimulants in evening. Cut down on caffeine as able. Regular exercise is helpful but not just prior to bed time.  

## 2015-06-04 NOTE — Assessment & Plan Note (Signed)
Avoid offending foods, start probiotics. Do not eat large meals in late evening and consider raising head of bed.  

## 2015-06-04 NOTE — Assessment & Plan Note (Signed)
minimize simple carbs. Increase exercise as tolerated.  

## 2015-06-04 NOTE — Progress Notes (Signed)
Subjective:    Patient ID: Taylor Mcdonald, male    DOB: 1955/11/15, 59 y.o.   MRN: [id number]  Chief Complaint  Patient presents with  . Follow-up    3 month    HPI Patient is in today for follow-up. Feels well. No recent illness or acute concerns. Only uses his Ritalin once or twice a month now that he is retired. Denies any concerning symptoms such as headaches, palpitations or concerns. Denies CP/palp/SOB/HA/congestion/fevers/GI or GU c/o. Taking meds as prescribed  Past Medical History  Diagnosis Date  . Gout   . Hypertension   . Low testosterone   . Migraine   . Allergy   . RLS (restless legs syndrome)   . Insomnia   . Hyperlipidemia   . GERD (gastroesophageal reflux disease)   . Hyperglycemia   . Fatigue   . Pain of left heel 10/01/2011  . Vitamin D deficiency 12/23/2011  . Sinusitis 03/04/2012  . Anxiety 03/04/2012  . Preventative health care 07/24/2012  . Lower back pain 09/09/2012  . Vasovagal near syncope 04/30/2013    Past Surgical History  Procedure Laterality Date  . Tonsillectomy    . Panendoscopy    . Lumbar laminectomy/decompression microdiscectomy N/A 05/13/2013    Procedure: LUMBAR LAMINECTOMY/DECOMPRESSION MICRODISCECTOMY 1 LEVEL L3-4;  Surgeon: Javier Docker, MD;  Location: [facility];  Service: Orthopedics;  Laterality: N/A;    Family History  Problem Relation Age of Onset  . Obesity Mother   . Hypertension Son   . Hypertension Maternal Grandmother   . Obesity Maternal Grandmother   . Cancer Maternal Grandmother 50    intestinal  . Dementia Maternal Grandfather     Social History   Social History  . Marital Status: Married    Spouse Name: N/A  . Number of Children: N/A  . Years of Education: N/A   Occupational History  . Not on file.   Social History Main Topics  . Smoking status: Never Smoker   . Smokeless tobacco: Never Used  . Alcohol Use: No  . Drug Use: Yes    Special: Hydrocodone  . Sexual Activity: Yes   Other Topics  Concern  . Not on file   Social History Narrative    Outpatient Prescriptions Prior to Visit  Medication Sig Dispense Refill  . allopurinol (ZYLOPRIM) 100 MG tablet TAKE 2 TABLETS (200 MG TOTAL) BY MOUTH DAILY. 60 tablet 5  . aspirin 81 MG tablet Take 1 tablet (81 mg total) by mouth daily. Resume 4 days post-op 30 tablet   . atorvastatin (LIPITOR) 10 MG tablet TAKE 1 TABLET BY MOUTH DAILY. 30 tablet 11  . fexofenadine-pseudoephedrine (ALLEGRA-D) 60-120 MG per tablet Take 1 tablet by mouth 2 (two) times daily as needed (allergies).    . Fish Oil-Cholecalciferol (FISH OIL + D3) 1200-1000 MG-UNIT CAPS Take 1 capsule by mouth daily.    . fluticasone (FLONASE) 50 MCG/ACT nasal spray PLACE 2 SPRAYS INTO THE NOSE DAILY. 48 g 3  . lisinopril (PRINIVIL,ZESTRIL) 20 MG tablet TAKE 1 TABLET (20 MG TOTAL) BY MOUTH DAILY. 90 tablet 3  . omeprazole (PRILOSEC) 20 MG capsule TAKE 1 CAPSULE (20 MG TOTAL) BY MOUTH DAILY. 90 capsule 2  . propranolol (INDERAL) 40 MG tablet Take 1 tablet (40 mg total) by mouth daily. 90 tablet 3  . testosterone cypionate (DEPO-TESTOSTERONE) 200 MG/ML injection Inject 0.5 mLs (100 mg total) into the muscle every 14 (fourteen) days. 10 mL 1  . colchicine 0.6 MG tablet Take  1 tablet (0.6 mg total) by mouth daily. (Patient not taking: Reported on 05/25/2015) 30 tablet 1  . ondansetron (ZOFRAN) 4 MG tablet Take 1 tablet (4 mg total) by mouth every 8 (eight) hours as needed for nausea or vomiting. (Patient not taking: Reported on 05/25/2015) 20 tablet 0  . promethazine (PHENERGAN) 25 MG suppository Place 25 mg rectally every 6 (six) hours as needed for nausea.    . methylphenidate (RITALIN) 10 MG tablet Take 1 tablet (10 mg total) by mouth daily as needed. (Patient not taking: Reported on 05/25/2015) 30 tablet 0   No facility-administered medications prior to visit.    No Known Allergies  Review of Systems  Constitutional: Negative for fever and malaise/fatigue.  HENT: Negative  for congestion.   Eyes: Negative for discharge.  Respiratory: Negative for shortness of breath.   Cardiovascular: Negative for chest pain, palpitations and leg swelling.  Gastrointestinal: Negative for nausea and abdominal pain.  Genitourinary: Negative for dysuria.  Musculoskeletal: Negative for falls.  Skin: Negative for rash.  Neurological: Negative for loss of consciousness and headaches.  Endo/Heme/Allergies: Negative for environmental allergies.  Psychiatric/Behavioral: Negative for depression. The patient is not nervous/anxious.        Objective:    Physical Exam  Constitutional: He is oriented to person, place, and time. He appears well-developed and well-nourished. No distress.  HENT:  Head: Normocephalic and atraumatic.  Nose: Nose normal.  Eyes: Right eye exhibits no discharge. Left eye exhibits no discharge.  Neck: Normal range of motion. Neck supple.  Cardiovascular: Normal rate and regular rhythm.   No murmur heard. Pulmonary/Chest: Effort normal and breath sounds normal.  Abdominal: Soft. Bowel sounds are normal. There is no tenderness.  Musculoskeletal: He exhibits no edema.  Neurological: He is alert and oriented to person, place, and time.  Skin: Skin is warm and dry.  Psychiatric: He has a normal mood and affect.  Nursing note and vitals reviewed.   BP 111/77 mmHg  Pulse 71  Temp(Src) 98 F (36.7 C) (Oral)  Ht  (1.88 m)  Wt 202 lb 6 oz (91.797 kg)  BMI 25.97 kg/m2  SpO2 98% Wt Readings from Last 3 Encounters:  05/25/15 202 lb 6 oz (91.797 kg)  02/14/15 199 lb 3.2 oz (90.357 kg)  10/04/14 202 lb 2 oz (91.683 kg)     Lab Results  Component Value Date   WBC 8.5 05/22/2015   HGB 14.9 05/22/2015   HCT 45.4 05/22/2015   PLT 236.0 05/22/2015   GLUCOSE 112* 05/22/2015   CHOL 117 05/22/2015   TRIG 128.0 05/22/2015   HDL 40.20 05/22/2015   LDLDIRECT 125.7 02/10/2013   LDLCALC 51 05/22/2015   ALT 25 05/22/2015   AST 26 05/22/2015   NA 138  05/22/2015   K 4.1 05/22/2015   CL 104 05/22/2015   CREATININE 1.21 05/22/2015   BUN 21 05/22/2015   CO2 28 05/22/2015   TSH 0.82 05/22/2015   PSA 0.50 08/25/2014   HGBA1C 5.2 05/22/2015    Lab Results  Component Value Date   TSH 0.82 05/22/2015   Lab Results  Component Value Date   WBC 8.5 05/22/2015   HGB 14.9 05/22/2015   HCT 45.4 05/22/2015   MCV 86.6 05/22/2015   PLT 236.0 05/22/2015   Lab Results  Component Value Date   NA 138 05/22/2015   K 4.1 05/22/2015   CO2 28 05/22/2015   GLUCOSE 112* 05/22/2015   BUN 21 05/22/2015   CREATININE 1.21 05/22/2015  BILITOT 0.6 05/22/2015   ALKPHOS 44 05/22/2015   AST 26 05/22/2015   ALT 25 05/22/2015   PROT 6.4 05/22/2015   ALBUMIN 4.0 05/22/2015   CALCIUM 9.4 05/22/2015   GFR 64.84 05/22/2015   Lab Results  Component Value Date   CHOL 117 05/22/2015   Lab Results  Component Value Date   HDL 40.20 05/22/2015   Lab Results  Component Value Date   LDLCALC 51 05/22/2015   Lab Results  Component Value Date   TRIG 128.0 05/22/2015   Lab Results  Component Value Date   CHOLHDL 3 05/22/2015   Lab Results  Component Value Date   HGBA1C 5.2 05/22/2015       Assessment & Plan:   Problem List Items Addressed This Visit    Testosterone deficiency    Increase testosterone and reassess at next blood draw      Insomnia    Encouraged good sleep hygiene such as dark, quiet room. No blue/green glowing lights such as computer screens in bedroom. No alcohol or stimulants in evening. Cut down on caffeine as able. Regular exercise is helpful but not just prior to bed time.       Hypertension    Well controlled, no changes to meds. Encouraged heart healthy diet such as the DASH diet and exercise as tolerated.       Relevant Orders   TSH   CBC   Lipid panel   Vit D  25 hydroxy (rtn osteoporosis monitoring)   Comprehensive metabolic panel   PSA   Hyperlipidemia    Tolerating statin, encouraged heart healthy  diet, avoid trans fats, minimize simple carbs and saturated fats. Increase exercise as tolerated      Hyperglycemia    minimize simple carbs. Increase exercise as tolerated.       GERD (gastroesophageal reflux disease)    Avoid offending foods, start probiotics. Do not eat large meals in late evening and consider raising head of bed.        Other Visit Diagnoses    Poor concentration    -  Primary    Relevant Medications    methylphenidate (RITALIN) 10 MG tablet    Other Relevant Orders    TSH    CBC    Lipid panel    Vit D  25 hydroxy (rtn osteoporosis monitoring)    Comprehensive metabolic panel    PSA    Colon cancer screening        Relevant Orders    Ambulatory referral to Gastroenterology    TSH    CBC    Lipid panel    Vit D  25 hydroxy (rtn osteoporosis monitoring)    Comprehensive metabolic panel    PSA    Low testosterone        Relevant Orders    TSH    CBC    Lipid panel    Vit D  25 hydroxy (rtn osteoporosis monitoring)    Comprehensive metabolic panel    PSA    Testosterone    Hyperlipidemia, mixed        Relevant Orders    TSH    CBC    Lipid panel    Vit D  25 hydroxy (rtn osteoporosis monitoring)    Comprehensive metabolic panel    PSA       I have changed Mr. Hoctor testosterone cypionate. I am also having him maintain his fexofenadine-pseudoephedrine, FISH OIL + D3, promethazine, colchicine, aspirin, ondansetron, fluticasone, allopurinol,  atorvastatin, lisinopril, propranolol, omeprazole, and methylphenidate.  Meds ordered this encounter  Medications  . testosterone cypionate (DEPO-TESTOSTERONE) 200 MG/ML injection    Sig: Inject 1 mL (200 mg total) into the muscle every 14 (fourteen) days.    Dispense:  10 mL    Refill:  1  . methylphenidate (RITALIN) 10 MG tablet    Sig: Take 1 tablet (10 mg total) by mouth daily as needed.    Dispense:  10 tablet    Refill:  0     Danise EdgeBLYTH, [place], MD

## 2015-06-04 NOTE — Assessment & Plan Note (Signed)
Increase testosterone and reassess at next blood draw

## 2015-06-13 ENCOUNTER — Other Ambulatory Visit: Payer: Self-pay | Admitting: Family Medicine

## 2015-08-09 MED FILL — PROPRANOLOL 40 MG TABLET: 40 | 90 days supply | Qty: 90 | Fill #1

## 2015-08-24 MED FILL — ALLOPURINOL 100 MG TABLET: 100 | 30 days supply | Qty: 60 | Fill #1

## 2015-09-01 MED FILL — ATORVASTATIN 10 MG TABLET: 10 | 30 days supply | Qty: 30 | Fill #10

## 2015-09-01 MED FILL — TESTOSTERONE CYP 200 MG/ML: 200 | 90 days supply | Qty: 6 | Fill #1

## 2015-09-25 MED FILL — OMEPRAZOLE DR 20 MG CAPSULE: 20 | 90 days supply | Qty: 90 | Fill #2

## 2015-09-25 MED FILL — LISINOPRIL 20 MG TABLET: 20 | 90 days supply | Qty: 90 | Fill #2

## 2015-09-25 MED FILL — ALLOPURINOL 100 MG TABLET: 100 | 30 days supply | Qty: 60 | Fill #2

## 2015-09-25 MED FILL — ATORVASTATIN 10 MG TABLET: 10 | 30 days supply | Qty: 30 | Fill #11

## 2015-10-31 ENCOUNTER — Other Ambulatory Visit: Payer: Self-pay | Admitting: Family Medicine

## 2015-11-16 ENCOUNTER — Other Ambulatory Visit: Payer: Self-pay | Admitting: Family Medicine

## 2015-11-16 MED FILL — PROPRANOLOL 40 MG TABLET: 40 | 90 days supply | Qty: 90 | Fill #0

## 2015-11-17 MED FILL — ATORVASTATIN 10 MG TABLET: 10 | 30 days supply | Qty: 30 | Fill #0

## 2015-11-20 ENCOUNTER — Telehealth: Payer: Self-pay | Admitting: *Deleted

## 2015-11-20 NOTE — Telephone Encounter (Signed)
Completed PA and received response that PA is not needed. JG//CMA

## 2015-11-22 ENCOUNTER — Other Ambulatory Visit: Payer: Self-pay | Admitting: Family Medicine

## 2015-11-23 ENCOUNTER — Other Ambulatory Visit (INDEPENDENT_AMBULATORY_CARE_PROVIDER_SITE_OTHER): Payer: BC Managed Care – PPO

## 2015-11-23 DIAGNOSIS — E291 Testicular hypofunction: Secondary | ICD-10-CM

## 2015-11-23 DIAGNOSIS — R41840 Attention and concentration deficit: Secondary | ICD-10-CM

## 2015-11-23 DIAGNOSIS — R7989 Other specified abnormal findings of blood chemistry: Secondary | ICD-10-CM

## 2015-11-23 DIAGNOSIS — Z1211 Encounter for screening for malignant neoplasm of colon: Secondary | ICD-10-CM | POA: Diagnosis not present

## 2015-11-23 DIAGNOSIS — E782 Mixed hyperlipidemia: Secondary | ICD-10-CM | POA: Diagnosis not present

## 2015-11-23 DIAGNOSIS — I10 Essential (primary) hypertension: Secondary | ICD-10-CM

## 2015-11-23 LAB — COMPREHENSIVE METABOLIC PANEL
ALBUMIN: 4.4 g/dL (ref 3.5–5.2)
ALK PHOS: 45 U/L (ref 39–117)
ALT: 25 U/L (ref 0–53)
AST: 18 U/L (ref 0–37)
BILIRUBIN TOTAL: 0.9 mg/dL (ref 0.2–1.2)
BUN: 17 mg/dL (ref 6–23)
CO2: 29 mEq/L (ref 19–32)
Calcium: 9.7 mg/dL (ref 8.4–10.5)
Chloride: 101 mEq/L (ref 96–112)
Creatinine, Ser: 1.17 mg/dL (ref 0.40–1.50)
GFR: 67.3 mL/min (ref >60.00)
Glucose, Bld: 106 mg/dL — ABNORMAL HIGH (ref 70–99)
Potassium: 5 mEq/L (ref 3.5–5.1)
Sodium: 136 mEq/L (ref 135–145)
Total Protein: 6.8 g/dL (ref 6.0–8.3)

## 2015-11-23 LAB — CBC
HCT: 50.9 % (ref 39.0–52.0)
Hemoglobin: 17.1 g/dL — ABNORMAL HIGH (ref 13.0–17.0)
MCHC: 33.6 g/dL (ref 30.0–36.0)
MCV: 82.6 fl (ref 78.0–100.0)
Platelets: 242 10*3/uL (ref 150.0–400.0)
RBC: 6.16 Mil/uL — ABNORMAL HIGH (ref 4.22–5.81)
RDW: 16.4 % — ABNORMAL HIGH (ref 11.5–15.5)
WBC: 9.7 10*3/uL (ref 4.0–10.5)

## 2015-11-23 LAB — LIPID PANEL
CHOLESTEROL: 146 mg/dL (ref 0–200)
HDL: 45.2 mg/dL (ref >39.00)
LDL Cholesterol: 72 mg/dL (ref 0–99)
NONHDL: 100.67
TRIGLYCERIDES: 143 mg/dL (ref 0.0–149.0)
Total CHOL/HDL Ratio: 3
VLDL: 28.6 mg/dL (ref 0.0–40.0)

## 2015-11-23 LAB — TESTOSTERONE: TESTOSTERONE: 284.95 ng/dL — AB (ref 300.00–890.00)

## 2015-11-23 LAB — PSA: PSA: 0.63 ng/mL (ref 0.10–4.00)

## 2015-11-23 LAB — TSH: TSH: 0.74 u[IU]/mL (ref 0.35–4.50)

## 2015-11-23 LAB — VITAMIN D 25 HYDROXY (VIT D DEFICIENCY, FRACTURES): VITD: 32.49 ng/mL (ref 30.00–100.00)

## 2015-11-23 NOTE — Telephone Encounter (Signed)
Faxed hardcopy for Testosterone to [facility] 

## 2015-11-23 NOTE — Telephone Encounter (Signed)
Submitted new PA request on [email or web address]  Key: [id number] Outcome: PA is not needed for the patient/medication combination in the request.  DrugTestosterone Cypionate 200MG /ML solution  Form: [facility] Electronic PA Form (NCPDP)

## 2015-11-28 ENCOUNTER — Encounter: Payer: Self-pay | Admitting: Family Medicine

## 2015-11-28 ENCOUNTER — Telehealth: Payer: Self-pay

## 2015-11-28 ENCOUNTER — Ambulatory Visit (INDEPENDENT_AMBULATORY_CARE_PROVIDER_SITE_OTHER): Payer: BC Managed Care – PPO | Admitting: Family Medicine

## 2015-11-28 VITALS — BP 110/78 | HR 70 | Temp 97.8°F | Ht 74.0 in | Wt 204.1 lb

## 2015-11-28 DIAGNOSIS — E785 Hyperlipidemia, unspecified: Secondary | ICD-10-CM

## 2015-11-28 DIAGNOSIS — G4700 Insomnia, unspecified: Secondary | ICD-10-CM

## 2015-11-28 DIAGNOSIS — E291 Testicular hypofunction: Secondary | ICD-10-CM

## 2015-11-28 DIAGNOSIS — E559 Vitamin D deficiency, unspecified: Secondary | ICD-10-CM | POA: Diagnosis not present

## 2015-11-28 DIAGNOSIS — E349 Endocrine disorder, unspecified: Secondary | ICD-10-CM

## 2015-11-28 DIAGNOSIS — I10 Essential (primary) hypertension: Secondary | ICD-10-CM

## 2015-11-28 DIAGNOSIS — R739 Hyperglycemia, unspecified: Secondary | ICD-10-CM

## 2015-11-28 DIAGNOSIS — Z0000 Encounter for general adult medical examination without abnormal findings: Secondary | ICD-10-CM

## 2015-11-28 DIAGNOSIS — R41840 Attention and concentration deficit: Secondary | ICD-10-CM | POA: Diagnosis not present

## 2015-11-28 MED ORDER — METHYLPHENIDATE HCL 10 MG PO TABS: 10.0000 mg | ORAL_TABLET | Freq: Every day | ORAL | Status: DC | PRN

## 2015-11-28 MED FILL — METHYLPHENIDATE 10 MG TAB: 10 | 30 days supply | Qty: 30 | Fill #0

## 2015-11-28 NOTE — Progress Notes (Signed)
Pre visit review using our clinic review tool, if applicable. No additional management support is needed unless otherwise documented below in the visit note. 

## 2015-11-28 NOTE — Assessment & Plan Note (Signed)
hgba1c acceptable, minimize simple carbs. Increase exercise as tolerated. Continue current meds 

## 2015-11-28 NOTE — Telephone Encounter (Signed)
Patient needs a lab apt scheduled for 1 week before his 6 month lab apt. Labs have been ordered i called the patient to try to schedule but was not able to reach.

## 2015-11-28 NOTE — Assessment & Plan Note (Signed)
Maintain daily supplements.  

## 2015-11-28 NOTE — Patient Instructions (Addendum)
[facility] Preventive Care for Adults, Male A healthy lifestyle and preventive care can promote health and wellness. Preventive health guidelines for men include the following key practices:  A routine yearly physical is a good way to check with your health care provider about your health and preventative screening. It is a chance to share any concerns and updates on your health and to receive a thorough exam.  Visit your dentist for a routine exam and preventative care every 6 months. Brush your teeth twice a day and floss once a day. Good oral hygiene prevents tooth decay and gum disease.  The frequency of eye exams is based on your age, health, family medical history, use of contact lenses, and other factors. Follow your health care provider's recommendations for frequency of eye exams.  Eat a healthy diet. Foods such as vegetables, fruits, whole grains, low-fat dairy products, and lean protein foods contain the nutrients you need without too many calories. Decrease your intake of foods high in solid fats, added sugars, and salt. Eat the right amount of calories for you.Get information about a proper diet from your health care provider, if necessary.  Regular physical exercise is one of the most important things you can do for your health. Most adults should get at least 150 minutes of moderate-intensity exercise (any activity that increases your heart rate and causes you to sweat) each week. In addition, most adults need muscle-strengthening exercises on 2 or more days a week.  Maintain a healthy weight. The body mass index (BMI) is a screening tool to identify possible weight problems. It provides an estimate of body fat based on height and weight. Your health care provider can find your BMI and can help you achieve or maintain a healthy weight.For adults 20 years and older:  A BMI below 18.5 is considered underweight.  A BMI of 18.5 to 24.9 is normal.  A BMI of 25 to 29.9 is considered  overweight.  A BMI of 30 and above is considered obese.  Maintain normal blood lipids and cholesterol levels by exercising and minimizing your intake of saturated fat. Eat a balanced diet with plenty of fruit and vegetables. Blood tests for lipids and cholesterol should begin at age 63 and be repeated every 5 years. If your lipid or cholesterol levels are high, you are over 50, or you are at high risk for heart disease, you may need your cholesterol levels checked more frequently.Ongoing high lipid and cholesterol levels should be treated with medicines if diet and exercise are not working.  If you smoke, find out from your health care provider how to quit. If you do not use tobacco, do not start.  Lung cancer screening is recommended for adults aged 74-80 years who are at high risk for developing lung cancer because of a history of smoking. A yearly low-dose CT scan of the lungs is recommended for people who have at least a 30-pack-year history of smoking and are a current smoker or have quit within the past 15 years. A pack year of smoking is smoking an average of 1 pack of cigarettes a day for 1 year (for example: 1 pack a day for 30 years or 2 packs a day for 15 years). Yearly screening should continue until the smoker has stopped smoking for at least 15 years. Yearly screening should be stopped for people who develop a health problem that would prevent them from having lung cancer treatment.  If you choose to drink alcohol, do not have  than 2 drinks per day. One drink is considered to be 12 ounces (355 mL) of beer, 5 ounces (148 mL) of wine, or 1.5 ounces (44 mL) of liquor.  Avoid use of street drugs. Do not share needles with anyone. Ask for help if you need support or instructions about stopping the use of drugs.  High blood pressure causes heart disease and increases the risk of stroke. Your blood pressure should be checked at least every 1-2 years. Ongoing high blood pressure should be  treated with medicines, if weight loss and exercise are not effective.  If you are 45-79 years old, ask your health care provider if you should take aspirin to prevent heart disease.  Diabetes screening is done by taking a blood sample to check your blood glucose level after you have not eaten for a certain period of time (fasting). If you are not overweight and you do not have risk factors for diabetes, you should be screened once every 3 years starting at age 45. If you are overweight or obese and you are 40-70 years of age, you should be screened for diabetes every year as part of your cardiovascular risk assessment.  Colorectal cancer can be detected and often prevented. Most routine colorectal cancer screening begins at the age of 50 and continues through age 75. However, your health care provider may recommend screening at an earlier age if you have risk factors for colon cancer. On a yearly basis, your health care provider may provide home test kits to check for hidden blood in the stool. Use of a small camera at the end of a tube to directly examine the colon (sigmoidoscopy or colonoscopy) can detect the earliest forms of colorectal cancer. Talk to your health care provider about this at age 50, when routine screening begins. Direct exam of the colon should be repeated every 5-10 years through age 75, unless early forms of precancerous polyps or small growths are found.  People who are at an increased risk for hepatitis B should be screened for this virus. You are considered at high risk for hepatitis B if:  You were born in a country where hepatitis B occurs often. Talk with your health care provider about which countries are considered high risk.  Your parents were born in a high-risk country and you have not received a shot to protect against hepatitis B (hepatitis B vaccine).  You have HIV or AIDS.  You use needles to inject street drugs.  You live with, or have sex with, someone who  has hepatitis B.  You are a man who has sex with other men (MSM).  You get hemodialysis treatment.  You take certain medicines for conditions such as cancer, organ transplantation, and autoimmune conditions.  Hepatitis C blood testing is recommended for all people born from 1945 through 1965 and any individual with known risks for hepatitis C.  Practice safe sex. Use condoms and avoid high-risk sexual practices to reduce the spread of sexually transmitted infections (STIs). STIs include gonorrhea, chlamydia, syphilis, trichomonas, herpes, HPV, and human immunodeficiency virus (HIV). Herpes, HIV, and HPV are viral illnesses that have no cure. They can result in disability, cancer, and death.  If you are a man who has sex with other men, you should be screened at least once per year for:  HIV.  Urethral, rectal, and pharyngeal infection of gonorrhea, chlamydia, or both.  If you are at risk of being infected with HIV, it is recommended that you take a   a prescription medicine daily to prevent HIV infection. This is called preexposure prophylaxis (PrEP). You are considered at risk if:  You are a man who has sex with other men (MSM) and have other risk factors.  You are a heterosexual man, are sexually active, and are at increased risk for HIV infection.  You take drugs by injection.  You are sexually active with a partner who has HIV.  Talk with your health care provider about whether you are at high risk of being infected with HIV. If you choose to begin PrEP, you should first be tested for HIV. You should then be tested every 3 months for as long as you are taking PrEP.  A one-time screening for abdominal aortic aneurysm (AAA) and surgical repair of large AAAs by ultrasound are recommended for men ages 31 to 16 years who are current or former smokers.  Healthy men should no longer receive prostate-specific antigen (PSA) blood tests as part of routine cancer screening. Talk with your health  care provider about prostate cancer screening.  Testicular cancer screening is not recommended for adult males who have no symptoms. Screening includes self-exam, a health care provider exam, and other screening tests. Consult with your health care provider about any symptoms you have or any concerns you have about testicular cancer.  Use sunscreen. Apply sunscreen liberally and repeatedly throughout the day. You should seek shade when your shadow is shorter than you. Protect yourself by wearing long sleeves, pants, a wide-brimmed hat, and sunglasses year round, whenever you are outdoors.  Once a month, do a whole-body skin exam, using a mirror to look at the skin on your back. Tell your health care provider about new moles, moles that have irregular borders, moles that are larger than a pencil eraser, or moles that have changed in shape or color.  Stay current with required vaccines (immunizations).  Influenza vaccine. All adults should be immunized every year.  Tetanus, diphtheria, and acellular pertussis (Td, Tdap) vaccine. An adult who has not previously received Tdap or who does not know his vaccine status should receive 1 dose of Tdap. This initial dose should be followed by tetanus and diphtheria toxoids (Td) booster doses every 10 years. Adults with an unknown or incomplete history of completing a 3-dose immunization series with Td-containing vaccines should begin or complete a primary immunization series including a Tdap dose. Adults should receive a Td booster every 10 years.  Varicella vaccine. An adult without evidence of immunity to varicella should receive 2 doses or a second dose if he has previously received 1 dose.  Human papillomavirus (HPV) vaccine. Males aged 11-21 years who have not received the vaccine previously should receive the 3-dose series. Males aged 22-26 years may be immunized. Immunization is recommended through the age of 45 years for any male who has sex with males  and did not get any or all doses earlier. Immunization is recommended for any person with an immunocompromised condition through the age of 31 years if he did not get any or all doses earlier. During the 3-dose series, the second dose should be obtained 4-8 weeks after the first dose. The third dose should be obtained 24 weeks after the first dose and 16 weeks after the second dose.  Zoster vaccine. One dose is recommended for adults aged 26 years or older unless certain conditions are present.  Measles, mumps, and rubella (MMR) vaccine. Adults born before [birth year] generally are considered immune to measles and mumps. Adults born in  1957 or later should have 1 or more doses of MMR vaccine unless there is a contraindication to the vaccine or there is laboratory evidence of immunity to each of the three diseases. A routine second dose of MMR vaccine should be obtained at least 28 days after the first dose for students attending postsecondary schools, health care workers, or international travelers. People who received inactivated measles vaccine or an unknown type of measles vaccine during 1963-1967 should receive 2 doses of MMR vaccine. People who received inactivated mumps vaccine or an unknown type of mumps vaccine before 1979 and are at high risk for mumps infection should consider immunization with 2 doses of MMR vaccine. Unvaccinated health care workers born before [birth year] who lack laboratory evidence of measles, mumps, or rubella immunity or laboratory confirmation of disease should consider measles and mumps immunization with 2 doses of MMR vaccine or rubella immunization with 1 dose of MMR vaccine.  Pneumococcal 13-valent conjugate (PCV13) vaccine. When indicated, a person who is uncertain of his immunization history and has no record of immunization should receive the PCV13 vaccine. All adults 42 years of age and older should receive this vaccine. An adult aged 90 years or older who has certain medical  conditions and has not been previously immunized should receive 1 dose of PCV13 vaccine. This PCV13 should be followed with a dose of pneumococcal polysaccharide (PPSV23) vaccine. Adults who are at high risk for pneumococcal disease should obtain the PPSV23 vaccine at least 8 weeks after the dose of PCV13 vaccine. Adults older than 61 years of age who have normal immune system function should obtain the PPSV23 vaccine dose at least 1 year after the dose of PCV13 vaccine.  Pneumococcal polysaccharide (PPSV23) vaccine. When PCV13 is also indicated, PCV13 should be obtained first. All adults aged 70 years and older should be immunized. An adult younger than age 71 years who has certain medical conditions should be immunized. Any person who resides in a nursing home or long-term care facility should be immunized. An adult smoker should be immunized. People with an immunocompromised condition and certain other conditions should receive both PCV13 and PPSV23 vaccines. People with human immunodeficiency virus (HIV) infection should be immunized as soon as possible after diagnosis. Immunization during chemotherapy or radiation therapy should be avoided. Routine use of PPSV23 vaccine is not recommended for American Indians, [place] Natives, or people younger than 65 years unless there are medical conditions that require PPSV23 vaccine. When indicated, people who have unknown immunization and have no record of immunization should receive PPSV23 vaccine. One-time revaccination 5 years after the first dose of PPSV23 is recommended for people aged 19-64 years who have chronic kidney failure, nephrotic syndrome, asplenia, or immunocompromised conditions. People who received 1-2 doses of PPSV23 before age 77 years should receive another dose of PPSV23 vaccine at age 59 years or later if at least 5 years have passed since the previous dose. Doses of PPSV23 are not needed for people immunized with PPSV23 at or after age 61  years.  Meningococcal vaccine. Adults with asplenia or persistent complement component deficiencies should receive 2 doses of quadrivalent meningococcal conjugate (MenACWY-D) vaccine. The doses should be obtained at least 2 months apart. Microbiologists working with certain meningococcal bacteria, [employer] recruits, people at risk during an outbreak, and people who travel to or live in countries with a high rate of meningitis should be immunized. A first-year college student up through age 48 years who is living in a residence hall should receive  a dose if he did not receive a dose on or after his 16th birthday. Adults who have certain high-risk conditions should receive one or more doses of vaccine.  Hepatitis A vaccine. Adults who wish to be protected from this disease, have chronic liver disease, work with hepatitis A-infected animals, work in hepatitis A research labs, or travel to or work in countries with a high rate of hepatitis A should be immunized. Adults who were previously unvaccinated and who anticipate close contact with an international adoptee during the first 60 days after arrival in the [place] from a country with a high rate of hepatitis A should be immunized.  Hepatitis B vaccine. Adults should be immunized if they wish to be protected from this disease, are under age 91 years and have diabetes, have chronic liver disease, have had more than one sex partner in the past 6 months, may be exposed to blood or other infectious body fluids, are household contacts or sex partners of hepatitis B positive people, are clients or workers in certain care facilities, or travel to or work in countries with a high rate of hepatitis B.  Haemophilus influenzae type b (Hib) vaccine. A previously unvaccinated person with asplenia or sickle cell disease or having a scheduled splenectomy should receive 1 dose of Hib vaccine. Regardless of previous immunization, a recipient of a hematopoietic stem cell  transplant should receive a 3-dose series 6-12 months after his successful transplant. Hib vaccine is not recommended for adults with HIV infection. Preventive Service / Frequency Ages 82 to 41  Blood pressure check.** / Every 3-5 years.  Lipid and cholesterol check.** / Every 5 years beginning at age 45.  Hepatitis C blood test.** / For any individual with known risks for hepatitis C.  Skin self-exam. / Monthly.  Influenza vaccine. / Every year.  Tetanus, diphtheria, and acellular pertussis (Tdap, Td) vaccine.** / Consult your health care provider. 1 dose of Td every 10 years.  Varicella vaccine.** / Consult your health care provider.  HPV vaccine. / 3 doses over 6 months, if 72 or younger.  Measles, mumps, rubella (MMR) vaccine.** / You need at least 1 dose of MMR if you were born in 1957 or later. You may also need a second dose.  Pneumococcal 13-valent conjugate (PCV13) vaccine.** / Consult your health care provider.  Pneumococcal polysaccharide (PPSV23) vaccine.** / 1 to 2 doses if you smoke cigarettes or if you have certain conditions.  Meningococcal vaccine.** / 1 dose if you are age 69 to 66 years and a Market researcher living in a residence hall, or have one of several medical conditions. You may also need additional booster doses.  Hepatitis A vaccine.** / Consult your health care provider.  Hepatitis B vaccine.** / Consult your health care provider.  Haemophilus influenzae type b (Hib) vaccine.** / Consult your health care provider. Ages 71 to 25  Blood pressure check.** / Every year.  Lipid and cholesterol check.** / Every 5 years beginning at age 29.  Lung cancer screening. / Every year if you are aged 37-80 years and have a 30-pack-year history of smoking and currently smoke or have quit within the past 15 years. Yearly screening is stopped once you have quit smoking for at least 15 years or develop a health problem that would prevent you from having  lung cancer treatment.  Fecal occult blood test (FOBT) of stool. / Every year beginning at age 54 and continuing until age 15. You may not have to  this test if you get a colonoscopy every 10 years.  Flexible sigmoidoscopy** or colonoscopy.** / Every 5 years for a flexible sigmoidoscopy or every 10 years for a colonoscopy beginning at age 50 and continuing until age 75.  Hepatitis C blood test.** / For all people born from 1945 through 1965 and any individual with known risks for hepatitis C.  Skin self-exam. / Monthly.  Influenza vaccine. / Every year.  Tetanus, diphtheria, and acellular pertussis (Tdap/Td) vaccine.** / Consult your health care provider. 1 dose of Td every 10 years.  Varicella vaccine.** / Consult your health care provider.  Zoster vaccine.** / 1 dose for adults aged 60 years or older.  Measles, mumps, rubella (MMR) vaccine.** / You need at least 1 dose of MMR if you were born in 1957 or later. You may also need a second dose.  Pneumococcal 13-valent conjugate (PCV13) vaccine.** / Consult your health care provider.  Pneumococcal polysaccharide (PPSV23) vaccine.** / 1 to 2 doses if you smoke cigarettes or if you have certain conditions.  Meningococcal vaccine.** / Consult your health care provider.  Hepatitis A vaccine.** / Consult your health care provider.  Hepatitis B vaccine.** / Consult your health care provider.  Haemophilus influenzae type b (Hib) vaccine.** / Consult your health care provider. Ages 65 and over  Blood pressure check.** / Every year.  Lipid and cholesterol check.**/ Every 5 years beginning at age 20.  Lung cancer screening. / Every year if you are aged 55-80 years and have a 30-pack-year history of smoking and currently smoke or have quit within the past 15 years. Yearly screening is stopped once you have quit smoking for at least 15 years or develop a health problem that would prevent you from having lung cancer treatment.  Fecal  occult blood test (FOBT) of stool. / Every year beginning at age 50 and continuing until age 75. You may not have to do this test if you get a colonoscopy every 10 years.  Flexible sigmoidoscopy** or colonoscopy.** / Every 5 years for a flexible sigmoidoscopy or every 10 years for a colonoscopy beginning at age 50 and continuing until age 75.  Hepatitis C blood test.** / For all people born from 1945 through 1965 and any individual with known risks for hepatitis C.  Abdominal aortic aneurysm (AAA) screening.** / A one-time screening for ages 65 to 75 years who are current or former smokers.  Skin self-exam. / Monthly.  Influenza vaccine. / Every year.  Tetanus, diphtheria, and acellular pertussis (Tdap/Td) vaccine.** / 1 dose of Td every 10 years.  Varicella vaccine.** / Consult your health care provider.  Zoster vaccine.** / 1 dose for adults aged 60 years or older.  Pneumococcal 13-valent conjugate (PCV13) vaccine.** / 1 dose for all adults aged 65 years and older.  Pneumococcal polysaccharide (PPSV23) vaccine.** / 1 dose for all adults aged 65 years and older.  Meningococcal vaccine.** / Consult your health care provider.  Hepatitis A vaccine.** / Consult your health care provider.  Hepatitis B vaccine.** / Consult your health care provider.  Haemophilus influenzae type b (Hib) vaccine.** / Consult your health care provider. **Family history and personal history of risk and conditions may change your health care provider's recommendations.   This information is not intended to replace advice given to you by your health care provider. Make sure you discuss any questions you have with your health care provider.   Document Released: 09/10/2001 Document Revised: 08/05/2014 Document Reviewed: 12/10/2010 Elsevier Interactive Patient Education 2016   2016 [employer].

## 2015-11-28 NOTE — Assessment & Plan Note (Signed)
Tolerating statin, encouraged heart healthy diet, avoid trans fats, minimize simple carbs and saturated fats. Increase exercise as tolerated 

## 2015-11-28 NOTE — Assessment & Plan Note (Signed)
Well controlled, no changes to meds. Encouraged heart healthy diet such as the DASH diet and exercise as tolerated.  °

## 2015-11-28 NOTE — Progress Notes (Signed)
Subjective:    Patient ID: Taylor Mcdonald, male    DOB: 01/23/1956, 60 y.o.   MRN: [id number]  Chief Complaint  Patient presents with  . Annual Exam    HPI Patient is in today for Annual Physical Exam.  Patient reports he has been doing well he stil has some fatigue but has been ongoing for a while now, patient also has some concerns about testosterone. Patient does report he has not been getting much sleep due to his wife snoring but has been managing. No recent illness, fever or acute concerns. Denies CP/palp/SOB/HA/congestion/fevers or GU c/o. Taking meds as prescribed   Past Medical History  Diagnosis Date  . Gout   . Hypertension   . Low testosterone   . Migraine   . Allergy   . RLS (restless legs syndrome)   . Insomnia   . Hyperlipidemia   . GERD (gastroesophageal reflux disease)   . Hyperglycemia   . Fatigue   . Pain of left heel 10/01/2011  . Vitamin D deficiency 12/23/2011  . Sinusitis 03/04/2012  . Anxiety 03/04/2012  . Preventative health care 07/24/2012  . Lower back pain 09/09/2012  . Vasovagal near syncope 04/30/2013    Past Surgical History  Procedure Laterality Date  . Tonsillectomy    . Panendoscopy    . Lumbar laminectomy/decompression microdiscectomy N/A 05/13/2013    Procedure: LUMBAR LAMINECTOMY/DECOMPRESSION MICRODISCECTOMY 1 LEVEL L3-4;  Surgeon: Javier Docker, MD;  Location: [facility];  Service: Orthopedics;  Laterality: N/A;    Family History  Problem Relation Age of Onset  . Obesity Mother   . Hypertension Son   . Hypertension Maternal Grandmother   . Obesity Maternal Grandmother   . Cancer Maternal Grandmother 50    intestinal  . Dementia Maternal Grandfather     Social History   Social History  . Marital Status: Married    Spouse Name: N/A  . Number of Children: N/A  . Years of Education: N/A   Occupational History  . Not on file.   Social History Main Topics  . Smoking status: Never Smoker   . Smokeless tobacco: Never Used  .  Alcohol Use: No  . Drug Use: Yes    Special: Hydrocodone  . Sexual Activity: Yes   Other Topics Concern  . Not on file   Social History Narrative    Outpatient Prescriptions Prior to Visit  Medication Sig Dispense Refill  . allopurinol (ZYLOPRIM) 100 MG tablet TAKE 2 TABLETS (200 MG TOTAL) BY MOUTH DAILY. 60 tablet 5  . aspirin 81 MG tablet Take 1 tablet (81 mg total) by mouth daily. Resume 4 days post-op 30 tablet   . atorvastatin (LIPITOR) 10 MG tablet TAKE 1 TABLET BY MOUTH DAILY. 30 tablet 2  . colchicine 0.6 MG tablet Take 1 tablet (0.6 mg total) by mouth daily. 30 tablet 1  . fexofenadine-pseudoephedrine (ALLEGRA-D) 60-120 MG per tablet Take 1 tablet by mouth 2 (two) times daily as needed (allergies).    . Fish Oil-Cholecalciferol (FISH OIL + D3) 1200-1000 MG-UNIT CAPS Take 1 capsule by mouth daily.    . fluticasone (FLONASE) 50 MCG/ACT nasal spray PLACE 2 SPRAYS INTO THE NOSE DAILY. 48 g 3  . lisinopril (PRINIVIL,ZESTRIL) 20 MG tablet TAKE 1 TABLET (20 MG TOTAL) BY MOUTH DAILY. 90 tablet 3  . omeprazole (PRILOSEC) 20 MG capsule TAKE 1 CAPSULE (20 MG TOTAL) BY MOUTH DAILY. 90 capsule 2  . ondansetron (ZOFRAN) 4 MG tablet Take 1 tablet (4  mg total) by mouth every 8 (eight) hours as needed for nausea or vomiting. 20 tablet 0  . promethazine (PHENERGAN) 25 MG suppository Place 25 mg rectally every 6 (six) hours as needed for nausea.    . propranolol (INDERAL) 40 MG tablet TAKE 1 TABLET (40 MG TOTAL) BY MOUTH DAILY. 90 tablet 3  . testosterone cypionate (DEPOTESTOSTERONE CYPIONATE) 200 MG/ML injection INJECT 1 ML INTO THE MUSCLE EVERY 14 DAYS 10 mL 1  . methylphenidate (RITALIN) 10 MG tablet Take 1 tablet (10 mg total) by mouth daily as needed. 10 tablet 0   No facility-administered medications prior to visit.    No Known Allergies  Review of Systems  Constitutional: Negative for fever and malaise/fatigue.  HENT: Negative for congestion.   Eyes: Negative for blurred vision.    Respiratory: Negative for shortness of breath.   Cardiovascular: Negative for chest pain, palpitations and leg swelling.  Gastrointestinal: Negative for nausea, abdominal pain and blood in stool.  Genitourinary: Negative for dysuria and frequency.  Musculoskeletal: Negative for falls.  Skin: Negative for rash.  Neurological: Negative for dizziness, loss of consciousness and headaches.  Endo/Heme/Allergies: Negative for environmental allergies.  Psychiatric/Behavioral: Negative for depression. The patient is not nervous/anxious.        Objective:    Physical Exam  Constitutional: He is oriented to person, place, and time. He appears well-developed and well-nourished. No distress.  HENT:  Head: Normocephalic and atraumatic.  Eyes: Conjunctivae are normal.  Neck: Neck supple. No thyromegaly present.  Cardiovascular: Normal rate, regular rhythm and normal heart sounds.   No murmur heard. Pulmonary/Chest: Effort normal and breath sounds normal. No respiratory distress. He has no wheezes.  Abdominal: Soft. Bowel sounds are normal. He exhibits no mass. There is no tenderness.  Musculoskeletal: He exhibits no edema.  Lymphadenopathy:    He has no cervical adenopathy.  Neurological: He is alert and oriented to person, place, and time.  Skin: Skin is warm and dry.  Psychiatric: He has a normal mood and affect. His behavior is normal.    BP 110/78 mmHg  Pulse 70  Temp(Src) 97.8 F (36.6 C) (Oral)  Ht 6\' 2"  (1.88 m)  Wt 204 lb 2 oz (92.59 kg)  BMI 26.20 kg/m2  SpO2 97% Wt Readings from Last 3 Encounters:  11/28/15 204 lb 2 oz (92.59 kg)  05/25/15 202 lb 6 oz (91.797 kg)  02/14/15 199 lb 3.2 oz (90.357 kg)     Lab Results  Component Value Date   WBC 9.7 11/23/2015   HGB 17.1* 11/23/2015   HCT 50.9 11/23/2015   PLT 242.0 11/23/2015   GLUCOSE 106* 11/23/2015   CHOL 146 11/23/2015   TRIG 143.0 11/23/2015   HDL 45.20 11/23/2015   LDLDIRECT 125.7 02/10/2013   LDLCALC 72  11/23/2015   ALT 25 11/23/2015   AST 18 11/23/2015   NA 136 11/23/2015   K 5.0 11/23/2015   CL 101 11/23/2015   CREATININE 1.17 11/23/2015   BUN 17 11/23/2015   CO2 29 11/23/2015   TSH 0.74 11/23/2015   PSA 0.63 11/23/2015   HGBA1C 5.2 05/22/2015    Lab Results  Component Value Date   TSH 0.74 11/23/2015   Lab Results  Component Value Date   WBC 9.7 11/23/2015   HGB 17.1* 11/23/2015   HCT 50.9 11/23/2015   MCV 82.6 11/23/2015   PLT 242.0 11/23/2015   Lab Results  Component Value Date   NA 136 11/23/2015   K 5.0 11/23/2015  CO2 29 11/23/2015   GLUCOSE 106* 11/23/2015   BUN 17 11/23/2015   CREATININE 1.17 11/23/2015   BILITOT 0.9 11/23/2015   ALKPHOS 45 11/23/2015   AST 18 11/23/2015   ALT 25 11/23/2015   PROT 6.8 11/23/2015   ALBUMIN 4.4 11/23/2015   CALCIUM 9.7 11/23/2015   GFR 67.30 11/23/2015   Lab Results  Component Value Date   CHOL 146 11/23/2015   Lab Results  Component Value Date   HDL 45.20 11/23/2015   Lab Results  Component Value Date   LDLCALC 72 11/23/2015   Lab Results  Component Value Date   TRIG 143.0 11/23/2015   Lab Results  Component Value Date   CHOLHDL 3 11/23/2015   Lab Results  Component Value Date   HGBA1C 5.2 05/22/2015       Assessment & Plan:   Problem List Items Addressed This Visit    Hyperglycemia    hgba1c acceptable, minimize simple carbs. Increase exercise as tolerated. Continue current meds      Relevant Orders   Comprehensive metabolic panel   CBC   Testos,Total,Free and SHBG (Male)   TSH   Lipid panel   Hemoglobin A1c   VITAMIN D 25 Hydroxy (Vit-D Deficiency, Fractures)   Hyperlipidemia    Tolerating statin, encouraged heart healthy diet, avoid trans fats, minimize simple carbs and saturated fats. Increase exercise as tolerated      Relevant Orders   Comprehensive metabolic panel   CBC   Testos,Total,Free and SHBG (Male)   TSH   Lipid panel   Hemoglobin A1c   VITAMIN D 25 Hydroxy  (Vit-D Deficiency, Fractures)   Hypertension    Well controlled, no changes to meds. Encouraged heart healthy diet such as the DASH diet and exercise as tolerated.       Relevant Orders   Comprehensive metabolic panel   CBC   Testos,Total,Free and SHBG (Male)   TSH   Lipid panel   Hemoglobin A1c   VITAMIN D 25 Hydroxy (Vit-D Deficiency, Fractures)   Insomnia   Relevant Orders   Comprehensive metabolic panel   CBC   Testos,Total,Free and SHBG (Male)   TSH   Lipid panel   Hemoglobin A1c   VITAMIN D 25 Hydroxy (Vit-D Deficiency, Fractures)   Preventative health care    Patient encouraged to maintain heart healthy diet, regular exercise, adequate sleep. Consider daily probiotics. Take medications as prescribed. Labs reviewed.      Relevant Orders   Comprehensive metabolic panel   CBC   Testos,Total,Free and SHBG (Male)   TSH   Lipid panel   Hemoglobin A1c   VITAMIN D 25 Hydroxy (Vit-D Deficiency, Fractures)   Testosterone deficiency    Improving with treatment, encouraged 1/2 ml IM qoweek      Relevant Orders   Comprehensive metabolic panel   CBC   Testos,Total,Free and SHBG (Male)   TSH   Lipid panel   Hemoglobin A1c   VITAMIN D 25 Hydroxy (Vit-D Deficiency, Fractures)   Vitamin D deficiency    Maintain daily supplements.       Relevant Orders   Comprehensive metabolic panel   CBC   Testos,Total,Free and SHBG (Male)   TSH   Lipid panel   Hemoglobin A1c   VITAMIN D 25 Hydroxy (Vit-D Deficiency, Fractures)    Other Visit Diagnoses    Poor concentration    -  Primary    Relevant Medications    methylphenidate (RITALIN) 10 MG tablet    Other  Relevant Orders    Comprehensive metabolic panel    CBC    Testos,Total,Free and SHBG (Male)    TSH    Lipid panel    Hemoglobin A1c    VITAMIN D 25 Hydroxy (Vit-D Deficiency, Fractures)       I am having Mr. Andrey CampanileWilson maintain his fexofenadine-pseudoephedrine, FISH OIL + D3, promethazine, colchicine,  aspirin, ondansetron, fluticasone, lisinopril, omeprazole, allopurinol, atorvastatin, propranolol, testosterone cypionate, and methylphenidate.  Meds ordered this encounter  Medications  . methylphenidate (RITALIN) 10 MG tablet    Sig: Take 1 tablet (10 mg total) by mouth daily as needed.    Dispense:  30 tablet    Refill:  0     Danise EdgeBLYTH, Britanie Harshman, MD

## 2015-11-28 NOTE — Assessment & Plan Note (Signed)
Improving with treatment, encouraged 1/2 ml IM qoweek

## 2015-11-28 NOTE — Assessment & Plan Note (Signed)
Patient encouraged to maintain heart healthy diet, regular exercise, adequate sleep. Consider daily probiotics. Take medications as prescribed. Labs reviewed 

## 2015-12-04 ENCOUNTER — Telehealth: Payer: Self-pay | Admitting: *Deleted

## 2015-12-04 NOTE — Telephone Encounter (Signed)
Received request for PA on Ritalin 10mg ; Initiated via Cover My Meds, awaiting response/SLS 05/08

## 2015-12-07 NOTE — Telephone Encounter (Signed)
PA denied and can be approved if pt has a dx of ADD or ADHD. Please advise. JG//CMA

## 2015-12-07 NOTE — Telephone Encounter (Signed)
He does not have those diagnosis and he has previously agree d to pay for his med because his insurance will not cover and he knows it. So PA not necessary.

## 2015-12-13 ENCOUNTER — Telehealth: Payer: Self-pay | Admitting: Family Medicine

## 2015-12-13 NOTE — Telephone Encounter (Signed)
Previous phone note is referring to another medication. Pharmacy would like to speak with nurse directly regarding status

## 2015-12-13 NOTE — Telephone Encounter (Signed)
Please refer to other phone note.  °

## 2015-12-13 NOTE — Telephone Encounter (Signed)
Caller Name: T[facility]iana [facility]  Relation to pt: self Call back number: [phone]   Reason for call:  Pharmacy called checking on the status of testosterone cypionate (DEPOTESTOSTERONE CYPIONATE) 200 MG/ML injection  PA since 11/23/15 and would like to know the status. Pharmacy stated the insurance PA # to expedite is [phone] reference # [id number]. Please advise

## 2015-12-14 NOTE — Telephone Encounter (Signed)
Elveria RoyalsJessica A Glover, CMA at 11/23/2015 9:15 AM     Status: Signed       Expand All Collapse All   Submitted new PA request on [email or web address]  Key: [id number] Outcome: PA is not needed for the patient/medication combination in the request.  DrugTestosterone Cypionate 200MG /ML solution  Form: [facility] Electronic PA Form (NCPDP)             Elveria RoyalsJessica A Glover, CMA at 11/20/2015 10:27 AM     Status: Signed       Expand All Collapse All   Completed PA and received response that PA is not needed. JG//CMA        Per note above, PA not need.  Called to verify with pharmacy.  Per pharm tech, medication per [employer] insurance still needs PA.  Called to initiate PA.  PA approved from today to 12/13/16.  Called pharmacy and made them aware.

## 2015-12-20 MED FILL — ATORVASTATIN 10 MG TABLET: 10 | 30 days supply | Qty: 30 | Fill #1

## 2015-12-22 MED FILL — TESTOSTERONE CYP 200 MG/ML: 200 | 28 days supply | Qty: 2 | Fill #0

## 2015-12-27 ENCOUNTER — Other Ambulatory Visit: Payer: Self-pay | Admitting: Family Medicine

## 2015-12-27 NOTE — Telephone Encounter (Signed)
Rx sent to the pharmacy by e-script.//AB/CMA 

## 2016-01-01 MED FILL — ALLOPURINOL 100 MG TABLET: 100 | 30 days supply | Qty: 60 | Fill #3

## 2016-01-01 MED FILL — LISINOPRIL 20 MG TABLET: 20 | 90 days supply | Qty: 90 | Fill #0

## 2016-01-19 MED FILL — TESTOSTERONE CYP 200 MG/ML: 200 | 28 days supply | Qty: 2 | Fill #1

## 2016-01-22 MED FILL — ATORVASTATIN 10 MG TABLET: 10 | 30 days supply | Qty: 30 | Fill #2

## 2016-01-25 ENCOUNTER — Other Ambulatory Visit: Payer: Self-pay | Admitting: Family Medicine

## 2016-01-26 MED FILL — FLUTICASONE PROP 50 MCG SPR: 50 | 90 days supply | Qty: 48 | Fill #0

## 2016-02-20 MED FILL — TESTOSTERONE CYP 200 MG/ML: 200 | 28 days supply | Qty: 2 | Fill #2

## 2016-02-20 MED FILL — PROPRANOLOL 40 MG TABLET: 40 | 90 days supply | Qty: 90 | Fill #1

## 2016-03-04 ENCOUNTER — Other Ambulatory Visit: Payer: Self-pay | Admitting: Family Medicine

## 2016-03-05 MED FILL — ALLOPURINOL 100 MG TABLET: 100 | 30 days supply | Qty: 60 | Fill #4

## 2016-03-05 MED FILL — ATORVASTATIN 10 MG TABLET: 10 | 30 days supply | Qty: 30 | Fill #0

## 2016-03-11 ENCOUNTER — Encounter: Payer: Self-pay | Admitting: Medical

## 2016-03-11 ENCOUNTER — Ambulatory Visit (INDEPENDENT_AMBULATORY_CARE_PROVIDER_SITE_OTHER): Payer: BC Managed Care – PPO | Admitting: Medical

## 2016-03-11 VITALS — BP 116/78 | HR 80 | Temp 99.0°F | Ht 74.0 in | Wt 208.8 lb

## 2016-03-11 DIAGNOSIS — R6883 Chills (without fever): Secondary | ICD-10-CM

## 2016-03-11 DIAGNOSIS — J0120 Acute ethmoidal sinusitis, unspecified: Secondary | ICD-10-CM | POA: Diagnosis not present

## 2016-03-11 LAB — CBC WITH DIFFERENTIAL/PLATELET
Basophils Absolute: 0 10*3/uL (ref 0.0–0.1)
Basophils Relative: 0.3 % (ref 0.0–3.0)
EOS ABS: 0 10*3/uL (ref 0.0–0.7)
Eosinophils Relative: 0.5 % (ref 0.0–5.0)
HCT: 48.3 % (ref 39.0–52.0)
HEMOGLOBIN: 16.6 g/dL (ref 13.0–17.0)
LYMPHS ABS: 1.1 10*3/uL (ref 0.7–4.0)
Lymphocytes Relative: 16.6 % (ref 12.0–46.0)
MCHC: 34.3 g/dL (ref 30.0–36.0)
MCV: 84.2 fl (ref 78.0–100.0)
MONO ABS: 1 10*3/uL (ref 0.1–1.0)
Monocytes Relative: 14.2 % — ABNORMAL HIGH (ref 3.0–12.0)
NEUTROS PCT: 68.4 % (ref 43.0–77.0)
Neutro Abs: 4.7 10*3/uL (ref 1.4–7.7)
Platelets: 165 10*3/uL (ref 150.0–400.0)
RBC: 5.74 Mil/uL (ref 4.22–5.81)
RDW: 15.1 % (ref 11.5–15.5)
WBC: 6.8 10*3/uL (ref 4.0–10.5)

## 2016-03-11 MED ORDER — AMOXICILLIN-POT CLAVULANATE 875-125 MG PO TABS
1.0000 | ORAL_TABLET | Freq: Two times a day (BID) | ORAL | 0 refills | Status: DC
Start: 2016-03-11 — End: 2016-08-01

## 2016-03-11 MED FILL — AMOX-CLAV 875-125 MG TABLET: 875-125 | 10 days supply | Qty: 20 | Fill #0

## 2016-03-11 NOTE — Progress Notes (Signed)
Subjective:    Patient ID: Taylor Mcdonald, male    DOB: 1955-08-04, 60 y.o.   MRN: [id number]  HPI  Pt in feeling fever, chills and sinus pressure.  Pt states 4-5 days of increasing both maxillary sinus area down frontal sinus area(but on exam only ethmoid sinus pain). Pt states sinus infection usually about 1 time a year. Pt states usually takes z-packs. Pt describes some nasal congestion before symptoms came on.   Pt took advil for his symptoms today for some relief.    Review of Systems  Constitutional: Positive for diaphoresis and fever. Negative for chills and fatigue.       Yesterday broke out with sweat. Feels little sweaty today.  HENT: Positive for congestion and sinus pressure. Negative for ear pain, sneezing, sore throat, tinnitus and trouble swallowing.   Respiratory: Negative for cough, choking, shortness of breath and wheezing.   Cardiovascular: Negative for chest pain and palpitations.  Gastrointestinal: Negative for abdominal pain.  Endocrine: Negative for polydipsia, polyphagia and polyuria.  Genitourinary: Negative for dysuria.  Musculoskeletal: Negative for back pain.       Describes faint achiness last night with fever.  Skin: Negative for rash.  Neurological: Positive for headaches. Negative for dizziness.       Describes mostly sinus pressure but also reports some HA.  Hematological: Negative for adenopathy. Does not bruise/bleed easily.  Psychiatric/Behavioral: Negative for behavioral problems and confusion.    Past Medical History:  Diagnosis Date  . Allergy   . Anxiety 03/04/2012  . Fatigue   . GERD (gastroesophageal reflux disease)   . Gout   . Hyperglycemia   . Hyperlipidemia   . Hypertension   . Insomnia   . Low testosterone   . Lower back pain 09/09/2012  . Migraine   . Pain of left heel 10/01/2011  . Preventative health care 07/24/2012  . RLS (restless legs syndrome)   . Sinusitis 03/04/2012  . Vasovagal near syncope 04/30/2013  . Vitamin D  deficiency 12/23/2011     Social History   Social History  . Marital status: Married    Spouse name: N/A  . Number of children: N/A  . Years of education: N/A   Occupational History  . Not on file.   Social History Main Topics  . Smoking status: Never Smoker  . Smokeless tobacco: Never Used  . Alcohol use No  . Drug use:     Types: Hydrocodone  . Sexual activity: Yes   Other Topics Concern  . Not on file   Social History Narrative  . No narrative on file    Past Surgical History:  Procedure Laterality Date  . LUMBAR LAMINECTOMY/DECOMPRESSION MICRODISCECTOMY N/A 05/13/2013   Procedure: LUMBAR LAMINECTOMY/DECOMPRESSION MICRODISCECTOMY 1 LEVEL L3-4;  Surgeon: Javier Docker, MD;  Location: [facility];  Service: Orthopedics;  Laterality: N/A;  . PANENDOSCOPY    . TONSILLECTOMY      Family History  Problem Relation Age of Onset  . Obesity Mother   . Hypertension Son   . Hypertension Maternal Grandmother   . Obesity Maternal Grandmother   . Cancer Maternal Grandmother 50    intestinal  . Dementia Maternal Grandfather     No Known Allergies  Current Outpatient Prescriptions on File Prior to Visit  Medication Sig Dispense Refill  . allopurinol (ZYLOPRIM) 100 MG tablet TAKE 2 TABLETS (200 MG TOTAL) BY MOUTH DAILY. 60 tablet 5  . aspirin 81 MG tablet Take 1 tablet (81 mg total) by  mouth daily. Resume 4 days post-op 30 tablet   . atorvastatin (LIPITOR) 10 MG tablet TAKE 1 TABLET BY MOUTH DAILY. 30 tablet 2  . colchicine 0.6 MG tablet Take 1 tablet (0.6 mg total) by mouth daily. 30 tablet 1  . fexofenadine-pseudoephedrine (ALLEGRA-D) 60-120 MG per tablet Take 1 tablet by mouth 2 (two) times daily as needed (allergies).    . Fish Oil-Cholecalciferol (FISH OIL + D3) 1200-1000 MG-UNIT CAPS Take 1 capsule by mouth daily.    . fluticasone (FLONASE) 50 MCG/ACT nasal spray PLACE 2 SPRAYS INTO THE NOSE DAILY. 48 g 3  . lisinopril (PRINIVIL,ZESTRIL) 20 MG tablet TAKE 1 TABLET (20 MG  TOTAL) BY MOUTH DAILY. 90 tablet 3  . methylphenidate (RITALIN) 10 MG tablet Take 1 tablet (10 mg total) by mouth daily as needed. 30 tablet 0  . omeprazole (PRILOSEC) 20 MG capsule TAKE 1 CAPSULE (20 MG TOTAL) BY MOUTH DAILY. 90 capsule 2  . ondansetron (ZOFRAN) 4 MG tablet Take 1 tablet (4 mg total) by mouth every 8 (eight) hours as needed for nausea or vomiting. 20 tablet 0  . promethazine (PHENERGAN) 25 MG suppository Place 25 mg rectally every 6 (six) hours as needed for nausea.    . propranolol (INDERAL) 40 MG tablet TAKE 1 TABLET (40 MG TOTAL) BY MOUTH DAILY. 90 tablet 3  . testosterone cypionate (DEPOTESTOSTERONE CYPIONATE) 200 MG/ML injection INJECT 1 ML INTO THE MUSCLE EVERY 14 DAYS 10 mL 1   No current facility-administered medications on file prior to visit.     BP 116/78 (BP Location: Left Arm, Patient Position: Sitting, Cuff Size: Normal)   Pulse 80   Temp 99 F (37.2 C) (Oral)   Ht 6\' 2"  (1.88 m)   Wt 208 lb 12.8 oz (94.7 kg)   SpO2 96%   BMI 26.81 kg/m       Objective:   Physical Exam  General  Mental Status - Alert. General Appearance - Well groomed. Not in acute distress.  Skin Rashes- No Rashes.  HEENT Head- Normal. Ear Auditory Canal - Left- Normal. Right - Normal.Tympanic Membrane- Left- Normal. Right- Normal. Eye Sclera/Conjunctiva- Left- Normal. Right- Normal. Nose & Sinuses Nasal Mucosa- Left-  Mild  Boggy and Congested. Right-   Mild Boggy and  Congested.Bilateral no maxillary and  No frontal sinus pressure. But ethmoid sinus tender directly. Mouth & Throat Lips: Upper Lip- Normal: no dryness, cracking, pallor, cyanosis, or vesicular eruption. Lower Lip-Normal: no dryness, cracking, pallor, cyanosis or vesicular eruption. Buccal Mucosa- Bilateral- No Aphthous ulcers. Oropharynx- No Discharge or Erythema. Tonsils: Characteristics- Bilateral- No Erythema or Congestion. Size/Enlargement- Bilateral- No enlargement. Discharge-  bilateral-None.  Neck Neck- Supple. No Masses. No lymphadenopathy. Good rom. No stiffness.   Chest and Lung Exam Auscultation: Breath Sounds:-Clear even and unlabored.  Cardiovascular Auscultation:Rythm- Regular, rate and rhythm. Murmurs & Other Heart Sounds:Ausculatation of the heart reveal- No Murmurs.  Lymphatic Head & Neck General Head & Neck Lymphatics: Bilateral: Description- No Localized lymphadenopathy.    Neurologic Cranial Nerve exam:- CN III-XII intact(No nystagmus), symmetric smile. Drift Test:- No drift. Finger to Nose:- Normal/Intact Strength:- 5/5 equal and symmetric strength both upper and lower extremities.       Assessment & Plan:  For your probable sinus infection rx of augmentin antibiotic. Would also ask you start your flonase nasal spray.  For fever or chills can alternate tylenol and ibuprofen.  For you chills will get cbc and assess wbc.  You have some neck soreness but good neck  range of motion. If your sinus pressure becomes severe HA and you develop neck stiffness would in that scenario recommend ED evaluation. If that is occurring and you go to ED  please let us know.  Follow up 7 days or as needed  Brittanni Cariker, Ramon DredgeEdward, VF CorporationPA-C

## 2016-03-11 NOTE — Progress Notes (Signed)
Pre visit review using our clinic review tool, if applicable. No additional management support is needed unless otherwise documented below in the visit note. 

## 2016-03-11 NOTE — Patient Instructions (Signed)
For your probable sinus infection rx of augmentin antibiotic. Would also ask you start your flonase nasal spray.  For fever or chills can alternate tylenol and ibuprofen.  For you chills will get cbc and assess wbc.  You have some neck soreness but good neck range of motion. If your sinus pressure becomes severe HA and you develop neck stiffness would in that scenario recommend ED evaluation. If that is occurring and you go to ED  please let us know.  Follow up 7 days or as needed

## 2016-03-28 ENCOUNTER — Other Ambulatory Visit: Payer: Self-pay | Admitting: Family Medicine

## 2016-03-28 NOTE — Telephone Encounter (Signed)
°  Relationship to patient: Self  Can be reached: [phone]   Pharmacy: [facility] [place] [address] [phone] (Phone) [phone] (Fax)     Reason for call: Request refill on testosterone cypionate (DEPOTESTOSTERONE CYPIONATE) 200 MG/ML injection [[id number]][[id number]]

## 2016-03-29 MED ORDER — TESTOSTERONE CYPIONATE 200 MG/ML IM SOLN: INTRAMUSCULAR | 1 refills | Status: DC

## 2016-03-29 NOTE — Telephone Encounter (Signed)
[facility] [place] [facility] to confirm that we have received PA forms and that this PA would get done asap.

## 2016-03-29 NOTE — Telephone Encounter (Signed)
Fax[facility] hardcopy to [facility]

## 2016-03-29 NOTE — Telephone Encounter (Signed)
Advise on refill. 

## 2016-03-29 NOTE — Telephone Encounter (Signed)
Ben from pharmacy states that a PA is needing for this rx , states he has faxed the form 4 times this week and still has not gotten a response.

## 2016-04-03 MED FILL — ATORVASTATIN 10 MG TABLET: 10 | 30 days supply | Qty: 30 | Fill #1

## 2016-04-04 MED FILL — TESTOSTERONE CYP 200 MG/ML: 200 | 84 days supply | Qty: 6 | Fill #0

## 2016-04-04 NOTE — Telephone Encounter (Signed)
Called [phone] [facility] to get approval of testosterone.  PA was done on 11/23/15 (see note), but had an override that expired on 03/19/16 and is why medication could not get refilled PA completed [phone](PA#[id number]) and will be faxed to our office of approval throught 04/05/2019.  [facility] informed of approval/patient informed as well and will be able to pickup medication today.

## 2016-04-16 ENCOUNTER — Telehealth: Payer: Self-pay

## 2016-04-16 NOTE — Telephone Encounter (Signed)
PA completed for Testosterone. Covermymeds [id number]. Approved 04/04/16-04/05/2019.  Approval scanned in chart. KP

## 2016-05-08 ENCOUNTER — Other Ambulatory Visit: Payer: Self-pay | Admitting: Family Medicine

## 2016-05-08 MED FILL — LISINOPRIL 20 MG TABLET: 20 | 90 days supply | Qty: 90 | Fill #1

## 2016-05-08 MED FILL — ATORVASTATIN 10 MG TABLET: 10 | 30 days supply | Qty: 30 | Fill #2

## 2016-05-09 MED FILL — OMEPRAZOLE DR 20 MG CAPSULE: 20 | 90 days supply | Qty: 90 | Fill #0

## 2016-05-10 MED FILL — FLUTICASONE PROP 50 MCG SPR: 50 | 90 days supply | Qty: 48 | Fill #1

## 2016-05-22 MED FILL — ALLOPURINOL 100 MG TABLET: 100 | 30 days supply | Qty: 60 | Fill #5

## 2016-05-22 MED FILL — PROPRANOLOL 40 MG TABLET: 40 | 90 days supply | Qty: 90 | Fill #2

## 2016-05-23 ENCOUNTER — Other Ambulatory Visit: Payer: BC Managed Care – PPO

## 2016-05-30 ENCOUNTER — Ambulatory Visit: Payer: BC Managed Care – PPO | Admitting: Family Medicine

## 2016-06-06 ENCOUNTER — Other Ambulatory Visit: Payer: Self-pay | Admitting: Family Medicine

## 2016-06-06 MED FILL — ATORVASTATIN 10 MG TABLET: 10 | 30 days supply | Qty: 30 | Fill #0

## 2016-06-24 MED FILL — TESTOSTERONE CYP 200 MG/ML: 200 | 84 days supply | Qty: 6 | Fill #1

## 2016-06-27 ENCOUNTER — Other Ambulatory Visit (INDEPENDENT_AMBULATORY_CARE_PROVIDER_SITE_OTHER): Payer: BC Managed Care – PPO

## 2016-06-27 ENCOUNTER — Other Ambulatory Visit: Payer: BC Managed Care – PPO

## 2016-06-27 DIAGNOSIS — E785 Hyperlipidemia, unspecified: Secondary | ICD-10-CM

## 2016-06-27 DIAGNOSIS — Z0000 Encounter for general adult medical examination without abnormal findings: Secondary | ICD-10-CM | POA: Diagnosis not present

## 2016-06-27 DIAGNOSIS — E559 Vitamin D deficiency, unspecified: Secondary | ICD-10-CM | POA: Diagnosis not present

## 2016-06-27 DIAGNOSIS — R739 Hyperglycemia, unspecified: Secondary | ICD-10-CM

## 2016-06-27 DIAGNOSIS — R41840 Attention and concentration deficit: Secondary | ICD-10-CM

## 2016-06-27 DIAGNOSIS — G4700 Insomnia, unspecified: Secondary | ICD-10-CM

## 2016-06-27 DIAGNOSIS — I10 Essential (primary) hypertension: Secondary | ICD-10-CM

## 2016-06-27 DIAGNOSIS — E349 Endocrine disorder, unspecified: Secondary | ICD-10-CM

## 2016-06-27 LAB — CBC
HEMATOCRIT: 49.8 % (ref 39.0–52.0)
Hemoglobin: 16.8 g/dL (ref 13.0–17.0)
MCHC: 33.8 g/dL (ref 30.0–36.0)
MCV: 86.9 fl (ref 78.0–100.0)
Platelets: 250 10*3/uL (ref 150.0–400.0)
RBC: 5.74 Mil/uL (ref 4.22–5.81)
RDW: 14.7 % (ref 11.5–15.5)
WBC: 9.2 10*3/uL (ref 4.0–10.5)

## 2016-06-27 LAB — COMPREHENSIVE METABOLIC PANEL
ALT: 30 U/L (ref 0–53)
AST: 21 U/L (ref 0–37)
Albumin: 4.1 g/dL (ref 3.5–5.2)
Alkaline Phosphatase: 45 U/L (ref 39–117)
BUN: 18 mg/dL (ref 6–23)
CHLORIDE: 103 meq/L (ref 96–112)
CO2: 29 meq/L (ref 19–32)
CREATININE: 1.18 mg/dL (ref 0.40–1.50)
Calcium: 9.2 mg/dL (ref 8.4–10.5)
GFR: 66.51 mL/min (ref >60.00)
Glucose, Bld: 109 mg/dL — ABNORMAL HIGH (ref 70–99)
Potassium: 4.8 mEq/L (ref 3.5–5.1)
SODIUM: 138 meq/L (ref 135–145)
Total Bilirubin: 0.8 mg/dL (ref 0.2–1.2)
Total Protein: 6.3 g/dL (ref 6.0–8.3)

## 2016-06-27 LAB — VITAMIN D 25 HYDROXY (VIT D DEFICIENCY, FRACTURES): VITD: 30.42 ng/mL (ref 30.00–100.00)

## 2016-06-27 LAB — LIPID PANEL
CHOL/HDL RATIO: 3
Cholesterol: 132 mg/dL (ref 0–200)
HDL: 41.6 mg/dL (ref >39.00)
LDL CALC: 54 mg/dL (ref 0–99)
NonHDL: 90.19
TRIGLYCERIDES: 180 mg/dL — AB (ref 0.0–149.0)
VLDL: 36 mg/dL (ref 0.0–40.0)

## 2016-06-27 LAB — HEMOGLOBIN A1C: Hgb A1c MFr Bld: 5.3 % (ref 4.6–6.5)

## 2016-06-27 LAB — TSH: TSH: 0.52 u[IU]/mL (ref 0.35–4.50)

## 2016-06-29 LAB — TESTOS,TOTAL,FREE AND SHBG (FEMALE)
Sex Hormone Binding Glob.: 25 nmol/L (ref 22–77)
TESTOSTERONE,FREE: 77.8 pg/mL (ref 35.0–155.0)
Testosterone,Total,LC/MS/MS: 389 ng/dL (ref 250–1100)

## 2016-07-04 ENCOUNTER — Ambulatory Visit (INDEPENDENT_AMBULATORY_CARE_PROVIDER_SITE_OTHER): Payer: BC Managed Care – PPO | Admitting: Family Medicine

## 2016-07-04 ENCOUNTER — Encounter: Payer: Self-pay | Admitting: Family Medicine

## 2016-07-04 VITALS — BP 126/88 | HR 72 | Temp 97.5°F | Wt 208.6 lb

## 2016-07-04 DIAGNOSIS — E782 Mixed hyperlipidemia: Secondary | ICD-10-CM

## 2016-07-04 DIAGNOSIS — J3489 Other specified disorders of nose and nasal sinuses: Secondary | ICD-10-CM | POA: Insufficient documentation

## 2016-07-04 DIAGNOSIS — M1A9XX0 Chronic gout, unspecified, without tophus (tophi): Secondary | ICD-10-CM | POA: Diagnosis not present

## 2016-07-04 DIAGNOSIS — I10 Essential (primary) hypertension: Secondary | ICD-10-CM

## 2016-07-04 DIAGNOSIS — Z0000 Encounter for general adult medical examination without abnormal findings: Secondary | ICD-10-CM

## 2016-07-04 DIAGNOSIS — E349 Endocrine disorder, unspecified: Secondary | ICD-10-CM | POA: Diagnosis not present

## 2016-07-04 DIAGNOSIS — R739 Hyperglycemia, unspecified: Secondary | ICD-10-CM

## 2016-07-04 DIAGNOSIS — R7989 Other specified abnormal findings of blood chemistry: Secondary | ICD-10-CM

## 2016-07-04 DIAGNOSIS — Z1211 Encounter for screening for malignant neoplasm of colon: Secondary | ICD-10-CM

## 2016-07-04 HISTORY — DX: Other specified disorders of nose and nasal sinuses: J34.89

## 2016-07-04 MED ORDER — MUPIROCIN 2 % EX OINT: 1.0000 "application " | TOPICAL_OINTMENT | Freq: Two times a day (BID) | CUTANEOUS | 0 refills | Status: DC

## 2016-07-04 MED FILL — MUPIROCIN 2% OINTMENT: 2 | 30 days supply | Qty: 22 | Fill #0

## 2016-07-04 NOTE — Assessment & Plan Note (Signed)
No recent flares in heartburn

## 2016-07-04 NOTE — Progress Notes (Signed)
Pre visit review using our clinic review tool, if applicable. No additional management support is needed unless otherwise documented below in the visit note. 

## 2016-07-04 NOTE — Patient Instructions (Signed)
Hypertension Hypertension, commonly called high blood pressure, is when the force of blood pumping through your arteries is too strong. Your arteries are the blood vessels that carry blood from your heart throughout your body. A blood pressure reading consists of a higher number over a lower number, such as 110/72. The higher number (systolic) is the pressure inside your arteries when your heart pumps. The lower number (diastolic) is the pressure inside your arteries when your heart relaxes. Ideally you want your blood pressure below 120/80. Hypertension forces your heart to work harder to pump blood. Your arteries may become narrow or stiff. Having untreated or uncontrolled hypertension can cause heart attack, stroke, kidney disease, and other problems. What increases the risk? Some risk factors for high blood pressure are controllable. Others are not. Risk factors you cannot control include:  Race. You may be at higher risk if you are African American.  Age. Risk increases with age.  Gender. Men are at higher risk than women before age 45 years. After age 65, women are at higher risk than men. Risk factors you can control include:  Not getting enough exercise or physical activity.  Being overweight.  Getting too much fat, sugar, calories, or salt in your diet.  Drinking too much alcohol. What are the signs or symptoms? Hypertension does not usually cause signs or symptoms. Extremely high blood pressure (hypertensive crisis) may cause headache, anxiety, shortness of breath, and nosebleed. How is this diagnosed? To check if you have hypertension, your health care provider will measure your blood pressure while you are seated, with your arm held at the level of your heart. It should be measured at least twice using the same arm. Certain conditions can cause a difference in blood pressure between your right and left arms. A blood pressure reading that is higher than normal on one occasion does  not mean that you need treatment. If it is not clear whether you have high blood pressure, you may be asked to return on a different day to have your blood pressure checked again. Or, you may be asked to monitor your blood pressure at home for 1 or more weeks. How is this treated? Treating high blood pressure includes making lifestyle changes and possibly taking medicine. Living a healthy lifestyle can help lower high blood pressure. You may need to change some of your habits. Lifestyle changes may include:  Following the DASH diet. This diet is high in fruits, vegetables, and whole grains. It is low in salt, red meat, and added sugars.  Keep your sodium intake below 2,300 mg per day.  Getting at least 30-45 minutes of aerobic exercise at least 4 times per week.  Losing weight if necessary.  Not smoking.  Limiting alcoholic beverages.  Learning ways to reduce stress. Your health care provider may prescribe medicine if lifestyle changes are not enough to get your blood pressure under control, and if one of the following is true:  You are 18-59 years of age and your systolic blood pressure is above 140.  You are 60 years of age or older, and your systolic blood pressure is above 150.  Your diastolic blood pressure is above 90.  You have diabetes, and your systolic blood pressure is over 140 or your diastolic blood pressure is over 90.  You have kidney disease and your blood pressure is above 140/90.  You have heart disease and your blood pressure is above 140/90. Your personal target blood pressure may vary depending on your medical   conditions, your age, and other factors. Follow these instructions at home:  Have your blood pressure rechecked as directed by your health care provider.  Take medicines only as directed by your health care provider. Follow the directions carefully. Blood pressure medicines must be taken as prescribed. The medicine does not work as well when you skip  doses. Skipping doses also puts you at risk for problems.  Do not smoke.  Monitor your blood pressure at home as directed by your health care provider. Contact a health care provider if:  You think you are having a reaction to medicines taken.  You have recurrent headaches or feel dizzy.  You have swelling in your ankles.  You have trouble with your vision. Get help right away if:  You develop a severe headache or confusion.  You have unusual weakness, numbness, or feel faint.  You have severe chest or abdominal pain.  You vomit repeatedly.  You have trouble breathing. This information is not intended to replace advice given to you by your health care provider. Make sure you discuss any questions you have with your health care provider. Document Released: 07/15/2005 Document Revised: 12/21/2015 Document Reviewed: 05/07/2013 Elsevier Interactive Patient Education  2017 [employer]  

## 2016-07-04 NOTE — Assessment & Plan Note (Signed)
hgba1c acceptable, minimize simple carbs. Increase exercise as tolerated.  

## 2016-07-04 NOTE — Assessment & Plan Note (Signed)
Well controlled, no changes to meds. Encouraged heart healthy diet such as the DASH diet and exercise as tolerated.  °

## 2016-07-04 NOTE — Assessment & Plan Note (Signed)
Tolerating statin, encouraged heart healthy diet, avoid trans fats, minimize simple carbs and saturated fats. Increase exercise as tolerated 

## 2016-07-04 NOTE — Assessment & Plan Note (Signed)
For last couple of days notes a scab pulling off of mucosa in right nares. Add mupirocin ointment to nares twice daily

## 2016-07-05 ENCOUNTER — Encounter: Payer: Self-pay | Admitting: Gastroenterology

## 2016-07-05 ENCOUNTER — Telehealth: Payer: Self-pay | Admitting: Internal Medicine

## 2016-07-05 NOTE — Telephone Encounter (Signed)
Colonoscopy scheduled with Dr. Russella DarStark

## 2016-07-05 NOTE — Telephone Encounter (Signed)
OK 

## 2016-07-17 MED FILL — ATORVASTATIN 10 MG TABLET: 10 | 30 days supply | Qty: 30 | Fill #1

## 2016-07-24 ENCOUNTER — Other Ambulatory Visit: Payer: Self-pay | Admitting: Family Medicine

## 2016-07-24 MED FILL — ALLOPURINOL 100 MG TABLET: 100 | 30 days supply | Qty: 60 | Fill #0

## 2016-07-28 NOTE — Assessment & Plan Note (Signed)
Referred for screening colonoscopy 

## 2016-07-28 NOTE — Progress Notes (Signed)
Patient ID: Taylor LesserSpencer W Mcdonald, male   DOB: [birth year], 60 y.o.   MRN: [id number]   Subjective:    Patient ID: Taylor LesserSpencer W Snead, male    DOB: [birth year], 60 y.o.   MRN: [id number]  Chief Complaint  Patient presents with  . Follow-up    Poor concentration.  . Nose Cocerns    When blowing his nose has noticed a scab comesout. Isn't sure if the proper healing is occuring.    HPI Patient is in today for follow up. He feels well today. No recent hospitalization or febrile illness. He has noted some nasal congestion and infrequent blood from right nostril when he blows his nose. Is not difficult to control. Denies CP/palp/SOB/HA/fevers/GI or GU c/o. Taking meds as prescribed  Past Medical History:  Diagnosis Date  . Allergy   . Anxiety 03/04/2012  . Fatigue   . GERD (gastroesophageal reflux disease)   . Gout   . Hyperglycemia   . Hyperlipidemia   . Hypertension   . Insomnia   . Internal nasal lesion 07/04/2016  . Low testosterone   . Lower back pain 09/09/2012  . Migraine   . Pain of left heel 10/01/2011  . Preventative health care 07/24/2012  . RLS (restless legs syndrome)   . Sinusitis 03/04/2012  . Vasovagal near syncope 04/30/2013  . Vitamin D deficiency 12/23/2011    Past Surgical History:  Procedure Laterality Date  . LUMBAR LAMINECTOMY/DECOMPRESSION MICRODISCECTOMY N/A 05/13/2013   Procedure: LUMBAR LAMINECTOMY/DECOMPRESSION MICRODISCECTOMY 1 LEVEL L3-4;  Surgeon: Javier DockerJeffrey C Beane, MD;  Location: [facility];  Service: Orthopedics;  Laterality: N/A;  . PANENDOSCOPY    . TONSILLECTOMY      Family History  Problem Relation Age of Onset  . Obesity Mother   . Hypertension Son   . Hypertension Maternal Grandmother   . Obesity Maternal Grandmother   . Cancer Maternal Grandmother 50    intestinal  . Dementia Maternal Grandfather     Social History   Social History  . Marital status: Married    Spouse name: N/A  . Number of children: N/A  . Years of education: N/A    Occupational History  . Not on file.   Social History Main Topics  . Smoking status: Never Smoker  . Smokeless tobacco: Never Used  . Alcohol use No  . Drug use:     Types: Hydrocodone  . Sexual activity: Yes   Other Topics Concern  . Not on file   Social History Narrative  . No narrative on file    Outpatient Medications Prior to Visit  Medication Sig Dispense Refill  . amoxicillin-clavulanate (AUGMENTIN) 875-125 MG tablet Take 1 tablet by mouth 2 (two) times daily. 20 tablet 0  . aspirin 81 MG tablet Take 1 tablet (81 mg total) by mouth daily. Resume 4 days post-op 30 tablet   . atorvastatin (LIPITOR) 10 MG tablet TAKE 1 TABLET BY MOUTH DAILY. 30 tablet 2  . colchicine 0.6 MG tablet Take 1 tablet (0.6 mg total) by mouth daily. 30 tablet 1  . fexofenadine-pseudoephedrine (ALLEGRA-D) 60-120 MG per tablet Take 1 tablet by mouth 2 (two) times daily as needed (allergies).    . Fish Oil-Cholecalciferol (FISH OIL + D3) 1200-1000 MG-UNIT CAPS Take 1 capsule by mouth daily.    . fluticasone (FLONASE) 50 MCG/ACT nasal spray PLACE 2 SPRAYS INTO THE NOSE DAILY. 48 g 3  . lisinopril (PRINIVIL,ZESTRIL) 20 MG tablet TAKE 1 TABLET (20 MG TOTAL) BY MOUTH DAILY. 90 tablet  3  . methylphenidate (RITALIN) 10 MG tablet Take 1 tablet (10 mg total) by mouth daily as needed. 30 tablet 0  . omeprazole (PRILOSEC) 20 MG capsule TAKE 1 CAPSULE (20 MG TOTAL) BY MOUTH DAILY. 90 capsule 2  . ondansetron (ZOFRAN) 4 MG tablet Take 1 tablet (4 mg total) by mouth every 8 (eight) hours as needed for nausea or vomiting. 20 tablet 0  . promethazine (PHENERGAN) 25 MG suppository Place 25 mg rectally every 6 (six) hours as needed for nausea.    . propranolol (INDERAL) 40 MG tablet TAKE 1 TABLET (40 MG TOTAL) BY MOUTH DAILY. 90 tablet 3  . testosterone cypionate (DEPOTESTOSTERONE CYPIONATE) 200 MG/ML injection INJECT 1 ML INTO THE MUSCLE EVERY 14 DAYS 10 mL 1  . allopurinol (ZYLOPRIM) 100 MG tablet TAKE 2 TABLETS (200  MG TOTAL) BY MOUTH DAILY. 60 tablet 5   No facility-administered medications prior to visit.     No Known Allergies  Review of Systems  Constitutional: Negative for fever and malaise/fatigue.  HENT: Positive for nosebleeds. Negative for congestion.   Eyes: Negative for blurred vision.  Respiratory: Negative for cough and shortness of breath.   Cardiovascular: Negative for chest pain, palpitations and leg swelling.  Gastrointestinal: Negative for abdominal pain, blood in stool and nausea.  Genitourinary: Negative for dysuria and frequency.  Musculoskeletal: Negative for falls.  Skin: Negative for rash.  Neurological: Negative for dizziness, loss of consciousness and headaches.  Endo/Heme/Allergies: Negative for environmental allergies.  Psychiatric/Behavioral: Negative for depression. The patient is not nervous/anxious.        Objective:    Physical Exam  Constitutional: He is oriented to person, place, and time. He appears well-developed and well-nourished. No distress.  HENT:  Head: Normocephalic and atraumatic.  Nose: Nose normal.  Eyes: Right eye exhibits no discharge. Left eye exhibits no discharge.  Neck: Normal range of motion. Neck supple.  Cardiovascular: Normal rate and regular rhythm.   No murmur heard. Pulmonary/Chest: Effort normal and breath sounds normal.  Abdominal: Soft. Bowel sounds are normal. There is no tenderness.  Musculoskeletal: He exhibits no edema.  Neurological: He is alert and oriented to person, place, and time.  Skin: Skin is warm and dry.  Psychiatric: He has a normal mood and affect.  Nursing note and vitals reviewed.   BP 126/88 (BP Location: Left Arm, Patient Position: Sitting, Cuff Size: Large)   Pulse 72   Temp 97.5 F (36.4 C) (Oral)   Wt 208 lb 9.6 oz (94.6 kg)   SpO2 98% Comment: RA  BMI 26.78 kg/m  Wt Readings from Last 3 Encounters:  07/04/16 208 lb 9.6 oz (94.6 kg)  03/11/16 208 lb 12.8 oz (94.7 kg)  11/28/15 204 lb 2 oz  (92.6 kg)     Lab Results  Component Value Date   WBC 9.2 06/27/2016   HGB 16.8 06/27/2016   HCT 49.8 06/27/2016   PLT 250.0 06/27/2016   GLUCOSE 109 (H) 06/27/2016   CHOL 132 06/27/2016   TRIG 180.0 (H) 06/27/2016   HDL 41.60 06/27/2016   LDLDIRECT 125.7 02/10/2013   LDLCALC 54 06/27/2016   ALT 30 06/27/2016   AST 21 06/27/2016   NA 138 06/27/2016   K 4.8 06/27/2016   CL 103 06/27/2016   CREATININE 1.18 06/27/2016   BUN 18 06/27/2016   CO2 29 06/27/2016   TSH 0.52 06/27/2016   PSA 0.63 11/23/2015   HGBA1C 5.3 06/27/2016    Lab Results  Component Value Date  TSH 0.52 06/27/2016   Lab Results  Component Value Date   WBC 9.2 06/27/2016   HGB 16.8 06/27/2016   HCT 49.8 06/27/2016   MCV 86.9 06/27/2016   PLT 250.0 06/27/2016   Lab Results  Component Value Date   NA 138 06/27/2016   K 4.8 06/27/2016   CO2 29 06/27/2016   GLUCOSE 109 (H) 06/27/2016   BUN 18 06/27/2016   CREATININE 1.18 06/27/2016   BILITOT 0.8 06/27/2016   ALKPHOS 45 06/27/2016   AST 21 06/27/2016   ALT 30 06/27/2016   PROT 6.3 06/27/2016   ALBUMIN 4.1 06/27/2016   CALCIUM 9.2 06/27/2016   GFR 66.51 06/27/2016   Lab Results  Component Value Date   CHOL 132 06/27/2016   Lab Results  Component Value Date   HDL 41.60 06/27/2016   Lab Results  Component Value Date   LDLCALC 54 06/27/2016   Lab Results  Component Value Date   TRIG 180.0 (H) 06/27/2016   Lab Results  Component Value Date   CHOLHDL 3 06/27/2016   Lab Results  Component Value Date   HGBA1C 5.3 06/27/2016       Assessment & Plan:   Problem List Items Addressed This Visit    Gout    No recent flares in heartburn      Hypertension    Well controlled, no changes to meds. Encouraged heart healthy diet such as the DASH diet and exercise as tolerated.       Relevant Orders   CBC   Comprehensive metabolic panel   TSH   Hyperlipidemia    Tolerating statin, encouraged heart healthy diet, avoid trans fats,  minimize simple carbs and saturated fats. Increase exercise as tolerated      Relevant Orders   Lipid panel   Hyperglycemia    hgba1c acceptable, minimize simple carbs. Increase exercise as tolerated.       Relevant Orders   Hemoglobin A1c   Preventative health care    Referred for screening colonoscopy      Internal nasal lesion    For last couple of days notes a scab pulling off of mucosa in right nares. Add mupirocin ointment to nares twice daily       Other Visit Diagnoses    Low testosterone    -  Primary   Relevant Orders   Testosterone   PSA   Colon cancer screening       Relevant Orders   Ambulatory referral to Gastroenterology      I am having Mr. Andrey CampanileWilson start on mupirocin ointment. I am also having him maintain his fexofenadine-pseudoephedrine, FISH OIL + D3, promethazine, colchicine, aspirin, ondansetron, propranolol, methylphenidate, lisinopril, fluticasone, amoxicillin-clavulanate, testosterone cypionate, omeprazole, and atorvastatin.  Meds ordered this encounter  Medications  . mupirocin ointment (BACTROBAN) 2 %    Sig: Place 1 application into the nose 2 (two) times daily.    Dispense:  22 g    Refill:  0      Danise EdgeBLYTH, STACEY, MD

## 2016-08-01 ENCOUNTER — Encounter: Payer: Self-pay | Admitting: Medical

## 2016-08-01 ENCOUNTER — Ambulatory Visit (INDEPENDENT_AMBULATORY_CARE_PROVIDER_SITE_OTHER): Payer: BC Managed Care – PPO | Admitting: Medical

## 2016-08-01 VITALS — BP 112/78 | HR 66 | Temp 97.6°F | Ht 74.0 in | Wt 211.6 lb

## 2016-08-01 DIAGNOSIS — J0100 Acute maxillary sinusitis, unspecified: Secondary | ICD-10-CM

## 2016-08-01 DIAGNOSIS — J3489 Other specified disorders of nose and nasal sinuses: Secondary | ICD-10-CM | POA: Diagnosis not present

## 2016-08-01 MED ORDER — METHYLPREDNISOLONE ACETATE 40 MG/ML IJ SUSP
40.0000 mg | Freq: Once | INTRAMUSCULAR | Status: AC
  Administered 2016-08-01: 40 mg via INTRAMUSCULAR

## 2016-08-01 MED ORDER — BENZONATATE 100 MG PO CAPS
100.0000 mg | ORAL_CAPSULE | Freq: Three times a day (TID) | ORAL | 0 refills | Status: DC | PRN
Start: 2016-08-01 — End: 2016-08-26

## 2016-08-01 MED ORDER — DOXYCYCLINE HYCLATE 100 MG PO TABS: 100.0000 mg | ORAL_TABLET | Freq: Two times a day (BID) | ORAL | 0 refills | Status: DC

## 2016-08-01 MED FILL — DOXYCYCLINE HYCLATE 100 MG: 100 | 20 days supply | Qty: 20 | Fill #0

## 2016-08-01 MED FILL — BENZONATATE 100 MG CAPSULE: 100 | 7 days supply | Qty: 21 | Fill #0

## 2016-08-01 NOTE — Patient Instructions (Addendum)
Your appear to have a sinus infection. I am prescribing antibiotic doxycycline for the infection. To help with the nasal congestion use your flonase. For your associated cough, I prescribed cough medicine benzonatate.  Regarding your skin lesion in your nose I will go ahead and put in referral for 2-3 months out. I do think this is a good idea in light of persisting lesion.  Rest, hydrate, tylenol for fever.  Follow up in 7 days or as needed.  Please stop otc afrin like spray. To counter act possible rebound nasal congestion we gave depomedrol.

## 2016-08-01 NOTE — Progress Notes (Signed)
Subjective:    Patient ID: Taylor Mcdonald, male    DOB: 1955/09/16, 61 y.o.   MRN: [id number]  HPI  Pt in with some sinus pressure recently recently over past week nasal congestion and runny nose. Pt reports frontal and maxillary sinus pressure. Pt also states this morning rt eye was matted and shut.  Rt side nostril area that has bled intermittently over past month. Pt saw Dr. Abner Greenspan and she gave him a topical antibiotic. Pt states despite useo of ointment symptoms persist. Worse since sinus issues. Pt has used humidifyer but dry heat from house may be effecting him.   Review of Systems  Constitutional: Negative for chills, fatigue and fever.  HENT: Positive for congestion, postnasal drip, sinus pain and sinus pressure.   Respiratory: Positive for cough. Negative for chest tightness, shortness of breath and wheezing.        Rare occasional cough.  Cardiovascular: Negative for chest pain and palpitations.  Gastrointestinal: Negative for abdominal pain.  Musculoskeletal: Negative for back pain.  Skin: Negative for rash.  Neurological: Negative for dizziness, weakness and headaches.  Hematological: Negative for adenopathy. Does not bruise/bleed easily.    Past Medical History:  Diagnosis Date  . Allergy   . Anxiety 03/04/2012  . Fatigue   . GERD (gastroesophageal reflux disease)   . Gout   . Hyperglycemia   . Hyperlipidemia   . Hypertension   . Insomnia   . Internal nasal lesion 07/04/2016  . Low testosterone   . Lower back pain 09/09/2012  . Migraine   . Pain of left heel 10/01/2011  . Preventative health care 07/24/2012  . RLS (restless legs syndrome)   . Sinusitis 03/04/2012  . Vasovagal near syncope 04/30/2013  . Vitamin D deficiency 12/23/2011     Social History   Social History  . Marital status: Married    Spouse name: N/A  . Number of children: N/A  . Years of education: N/A   Occupational History  . Not on file.   Social History Main Topics  . Smoking  status: Never Smoker  . Smokeless tobacco: Never Used  . Alcohol use No  . Drug use:     Types: Hydrocodone  . Sexual activity: Yes   Other Topics Concern  . Not on file   Social History Narrative  . No narrative on file    Past Surgical History:  Procedure Laterality Date  . LUMBAR LAMINECTOMY/DECOMPRESSION MICRODISCECTOMY N/A 05/13/2013   Procedure: LUMBAR LAMINECTOMY/DECOMPRESSION MICRODISCECTOMY 1 LEVEL L3-4;  Surgeon: Javier Docker, MD;  Location: [facility];  Service: Orthopedics;  Laterality: N/A;  . PANENDOSCOPY    . TONSILLECTOMY      Family History  Problem Relation Age of Onset  . Obesity Mother   . Hypertension Son   . Hypertension Maternal Grandmother   . Obesity Maternal Grandmother   . Cancer Maternal Grandmother 50    intestinal  . Dementia Maternal Grandfather     No Known Allergies  Current Outpatient Prescriptions on File Prior to Visit  Medication Sig Dispense Refill  . allopurinol (ZYLOPRIM) 100 MG tablet TAKE 2 TABLETS (200 MG TOTAL) BY MOUTH DAILY. (Patient taking differently: TAKE 1 TABLETS (200 MG TOTAL) BY MOUTH DAILY.) 60 tablet 5  . aspirin 81 MG tablet Take 1 tablet (81 mg total) by mouth daily. Resume 4 days post-op 30 tablet   . atorvastatin (LIPITOR) 10 MG tablet TAKE 1 TABLET BY MOUTH DAILY. 30 tablet 2  . fexofenadine-pseudoephedrine (ALLEGRA-D)  60-120 MG per tablet Take 1 tablet by mouth 2 (two) times daily as needed (allergies).    . Fish Oil-Cholecalciferol (FISH OIL + D3) 1200-1000 MG-UNIT CAPS Take 1 capsule by mouth daily.    . fluticasone (FLONASE) 50 MCG/ACT nasal spray PLACE 2 SPRAYS INTO THE NOSE DAILY. 48 g 3  . lisinopril (PRINIVIL,ZESTRIL) 20 MG tablet TAKE 1 TABLET (20 MG TOTAL) BY MOUTH DAILY. 90 tablet 3  . methylphenidate (RITALIN) 10 MG tablet Take 1 tablet (10 mg total) by mouth daily as needed. 30 tablet 0  . omeprazole (PRILOSEC) 20 MG capsule TAKE 1 CAPSULE (20 MG TOTAL) BY MOUTH DAILY. 90 capsule 2  . ondansetron  (ZOFRAN) 4 MG tablet Take 1 tablet (4 mg total) by mouth every 8 (eight) hours as needed for nausea or vomiting. 20 tablet 0  . promethazine (PHENERGAN) 25 MG suppository Place 25 mg rectally every 6 (six) hours as needed for nausea.    . propranolol (INDERAL) 40 MG tablet TAKE 1 TABLET (40 MG TOTAL) BY MOUTH DAILY. 90 tablet 3  . testosterone cypionate (DEPOTESTOSTERONE CYPIONATE) 200 MG/ML injection INJECT 1 ML INTO THE MUSCLE EVERY 14 DAYS 10 mL 1  . colchicine 0.6 MG tablet Take 1 tablet (0.6 mg total) by mouth daily. (Patient not taking: Reported on 08/01/2016) 30 tablet 1  . mupirocin ointment (BACTROBAN) 2 % Place 1 application into the nose 2 (two) times daily. (Patient not taking: Reported on 08/01/2016) 22 g 0   No current facility-administered medications on file prior to visit.     BP 112/78 (BP Location: Right Arm, Patient Position: Sitting, Cuff Size: Large)   Pulse 66   Temp 97.6 F (36.4 C) (Oral)   Ht 6\' 2"  (1.88 m)   Wt 211 lb 9.6 oz (96 kg)   SpO2 99%   BMI 27.17 kg/m       Objective:   Physical Exam   General  Mental Status - Alert. General Appearance - Well groomed. Not in acute distress.  Skin Rashes- No Rashes.  HEENT Head- Normal. Ear Auditory Canal - Left- Normal. Right - Normal.Tympanic Membrane- Left- Normal. Right- Normal. Eye Sclera/Conjunctiva- Left- Normal. Right- Normal. Nose & Sinuses Nasal Mucosa- Left-  Boggy and Congested. Right-  Boggy and  Congested.Bilateral maxillary and frontal sinus pressure.(on inspection high in rt nostril difficult to view but partial view of lesion).  Mouth & Throat Lips: Upper Lip- Normal: no dryness, cracking, pallor, cyanosis, or vesicular eruption. Lower Lip-Normal: no dryness, cracking, pallor, cyanosis or vesicular eruption. Buccal Mucosa- Bilateral- No Aphthous ulcers. Oropharynx- No Discharge or Erythema. Tonsils: Characteristics- Bilateral- No Erythema or Congestion. Size/Enlargement- Bilateral- No  enlargement. Discharge- bilateral-None.  Neck Neck- Supple. No Masses.   Chest and Lung Exam Auscultation: Breath Sounds:-Clear even and unlabored.  Cardiovascular Auscultation:Rythm- Regular, rate and rhythm. Murmurs & Other Heart Sounds:Ausculatation of the heart reveal- No Murmurs.  Lymphatic Head & Neck General Head & Neck Lymphatics: Bilateral: Description- No Localized lymphadenopathy.      Assessment & Plan:  Your appear to have a sinus infection. I am prescribing antibiotic doxycycline for the infection. To help with the nasal congestion use your flonase. For your associated cough, I prescribed cough medicine benzonatate.  Regarding your skin lesion in your nose I will go ahead and put in referral for 2-3 months out. I do think this is a good idea in light of persisting lesion.  Rest, hydrate, tylenol for fever.  Follow up in 7 days or as  needed.  Please stop otc afrin like spray. To counter act possible rebound nasal congestion we gave depomedrol.  Seara Hinesley, Ramon Dredge, PA-C

## 2016-08-14 MED FILL — LISINOPRIL 20 MG TABLET: 20 | 90 days supply | Qty: 90 | Fill #2

## 2016-08-19 MED FILL — ATORVASTATIN 10 MG TABLET: 10 | 30 days supply | Qty: 30 | Fill #2

## 2016-08-22 MED FILL — CLINDAMYCIN HCL 300 MG CAPS: 300 | 14 days supply | Qty: 42 | Fill #0

## 2016-08-23 MED FILL — FLUTICASONE PROP 50 MCG SPR: 50 | 90 days supply | Qty: 48 | Fill #2

## 2016-08-26 ENCOUNTER — Ambulatory Visit (AMBULATORY_SURGERY_CENTER): Payer: Self-pay

## 2016-08-26 VITALS — Ht 74.0 in | Wt 211.0 lb

## 2016-08-26 DIAGNOSIS — Z1211 Encounter for screening for malignant neoplasm of colon: Secondary | ICD-10-CM

## 2016-08-26 MED ORDER — SUPREP BOWEL PREP KIT 17.5-3.13-1.6 GM/177ML PO SOLN: 1.0000 | Freq: Once | ORAL | 0 refills | Status: AC

## 2016-08-26 MED FILL — SUPREP BOWEL PREP KIT: 17.5-3.13-1 | 1 days supply | Qty: 354 | Fill #0

## 2016-08-26 NOTE — Progress Notes (Signed)
No allergies to eggs or soy No diet meds No home oxygen No past problems with anesthesia  Declined emmi 

## 2016-08-27 MED FILL — PROPRANOLOL 40 MG TABLET: 40 | 90 days supply | Qty: 90 | Fill #3

## 2016-09-06 ENCOUNTER — Encounter: Payer: Self-pay | Admitting: Family Medicine

## 2016-09-06 ENCOUNTER — Ambulatory Visit (INDEPENDENT_AMBULATORY_CARE_PROVIDER_SITE_OTHER): Payer: BC Managed Care – PPO | Admitting: Family Medicine

## 2016-09-06 VITALS — BP 110/70 | HR 85 | Temp 98.2°F | Ht 74.0 in | Wt 211.4 lb

## 2016-09-06 DIAGNOSIS — B9689 Other specified bacterial agents as the cause of diseases classified elsewhere: Secondary | ICD-10-CM

## 2016-09-06 DIAGNOSIS — J208 Acute bronchitis due to other specified organisms: Secondary | ICD-10-CM

## 2016-09-06 MED ORDER — PROMETHAZINE-CODEINE 6.25-10 MG/5ML PO SYRP: 5.0000 mL | ORAL_SOLUTION | Freq: Four times a day (QID) | ORAL | 0 refills | Status: DC | PRN

## 2016-09-06 MED ORDER — AZITHROMYCIN 250 MG PO TABS: ORAL_TABLET | ORAL | 0 refills | Status: DC

## 2016-09-06 MED FILL — AZITHROMYCIN 250 MG TABLET: 250 | 5 days supply | Qty: 6 | Fill #0

## 2016-09-06 MED FILL — PROMETHAZINE-CODEINE SYRUP: 6.25-10 | 6 days supply | Qty: 120 | Fill #0

## 2016-09-06 NOTE — Progress Notes (Signed)
Chief Complaint  Patient presents with  . Cough    dry-x 3 days-pt is on Clindamycin 300mg  given by ENT and took Bensonatate     . Sore Throat    x 3 days-worse in the am and at night better during the day    Taylor Mcdonald here for URI complaints.  Duration: 3 days  Associated symptoms: sore throat, nasal congestion, runny nose, feels very warm at night but has not taken temp, and dry cough Denies: ear pain/drainage, itchy/watery eyes, shaking, muscle aches, SOB Has colonoscopy on Monday and wants to make sure nothing else is going on.  Treatment to date: He is on Clindamycin for a separate issue regarding his nose, Allegra, Sudafed, Tessalon Perles Sick contacts: No  ROS:  Const: Has felt feverish HEENT: As noted in HPI Lungs: No SOB  Past Medical History:  Diagnosis Date  . Allergy   . Anxiety 03/04/2012  . Fatigue   . GERD (gastroesophageal reflux disease)   . Gout   . Hyperglycemia   . Hyperlipidemia   . Hypertension   . Insomnia   . Internal nasal lesion 07/04/2016  . Low testosterone   . Lower back pain 09/09/2012  . Migraine   . Pain of left heel 10/01/2011  . Preventative health care 07/24/2012  . RLS (restless legs syndrome)   . Sinusitis 03/04/2012  . Vasovagal near syncope 04/30/2013  . Vitamin D deficiency 12/23/2011   Family History  Problem Relation Age of Onset  . Obesity Mother   . Hypertension Son   . Hypertension Maternal Grandmother   . Obesity Maternal Grandmother   . Cancer Maternal Grandmother 50    intestinal  . Dementia Maternal Grandfather   . Colon cancer Neg Hx     BP 110/70 (BP Location: Left Arm, Patient Position: Sitting, Cuff Size: Large)   Pulse 85   Temp 98.2 F (36.8 C) (Oral)   Ht 6\' 2"  (1.88 m)   Wt 211 lb 6.4 oz (95.9 kg)   SpO2 98%   BMI 27.14 kg/m  General: Awake, alert, appears stated age HEENT: AT, [place], ears patent b/l and TM's neg, nares patent w/o discharge, no sinus tenderness, pharynx pink and without exudates,  MMM Neck: No masses or asymmetry Heart: RRR, no murmurs, no bruits Lungs: CTAB, no accessory muscle use Psych: Age appropriate judgment and insight, normal mood and affect  Acute bacterial bronchitis - Plan: azithromycin (ZITHROMAX) 250 MG tablet, promethazine-codeine (PHENERGAN WITH CODEINE) 6.25-10 MG/5ML syrup  Orders as above. If he is having fevers at night, OK to take abx. Also OK if SOB (should also consider seeking immediate care) or shaking. Take syrup at night, avoid during day if it makes him drowsy. Continue to push fluids, practice good hand hygiene, cover mouth when coughing. F/u prn. Pt voiced understanding and agreement to the plan.  Jilda Rocheicholas Paul SomersetWendling, DO 09/06/16 10:52 AM

## 2016-09-06 NOTE — Patient Instructions (Addendum)
Continue to push fluids, practice good hand hygiene, and cover your mouth if you cough.  This is likely viral. If you are having fevers at night, take the antibiotic. Also if you start to have shaking or shortness of breath, OK to take as well. Consider seeking immediate care if you have trouble breathing.  Do not use cough syrup during day if you are having drowsiness from it. Take at night before bed.

## 2016-09-06 NOTE — Progress Notes (Signed)
Pre visit review using our clinic review tool, if applicable. No additional management support is needed unless otherwise documented below in the visit note. 

## 2016-09-09 ENCOUNTER — Encounter: Payer: Self-pay | Admitting: Gastroenterology

## 2016-09-09 ENCOUNTER — Ambulatory Visit (AMBULATORY_SURGERY_CENTER): Payer: BC Managed Care – PPO | Admitting: Gastroenterology

## 2016-09-09 VITALS — BP 106/60 | HR 74 | Temp 98.0°F | Resp 14 | Ht 74.0 in | Wt 211.0 lb

## 2016-09-09 DIAGNOSIS — Z1211 Encounter for screening for malignant neoplasm of colon: Secondary | ICD-10-CM | POA: Diagnosis present

## 2016-09-09 DIAGNOSIS — Z1212 Encounter for screening for malignant neoplasm of rectum: Secondary | ICD-10-CM

## 2016-09-09 MED ORDER — SODIUM CHLORIDE 0.9 % IV SOLN: 500.0000 mL | INTRAVENOUS | Status: DC

## 2016-09-09 NOTE — Progress Notes (Signed)
Report to PACU, RN, vss, BBS= Clear.  

## 2016-09-09 NOTE — Progress Notes (Signed)
Pt's states no medical or surgical changes since previsit or office visit. 

## 2016-09-09 NOTE — Op Note (Addendum)
[facility] Patient Name: Taylor HinesSpencer Mcdonald Procedure Date: 09/09/2016 10:55 AM MRN: [id number] Endoscopist: Meryl DareMalcolm T Elleah Hemsley , MD Age: [age] Referring MD:  Date of Birth: [birth year] Gender: Male Account #: [id number] Procedure:                Colonoscopy Indications:              Screening for colorectal malignant neoplasm Medicines:                Monitored Anesthesia Care Procedure:                Pre-Anesthesia Assessment:                           - Prior to the procedure, a History and Physical                            was performed, and patient medications and                            allergies were reviewed. The patient's tolerance of                            previous anesthesia was also reviewed. The risks                            and benefits of the procedure and the sedation                            options and risks were discussed with the patient.                            All questions were answered, and informed consent                            was obtained. Prior Anticoagulants: The patient has                            taken no previous anticoagulant or antiplatelet                            agents. ASA Grade Assessment: II - A patient with                            mild systemic disease. After reviewing the risks                            and benefits, the patient was deemed in                            satisfactory condition to undergo the procedure.                           After obtaining informed consent, the colonoscope  was passed under direct vision. Throughout the                            procedure, the patient's blood pressure, pulse, and                            oxygen saturations were monitored continuously. The                            Model PCF-H190L [id number]) scope was introduced                            through the anus and advanced to the the cecum,                            identified by  appendiceal orifice and ileocecal                            valve. The ileocecal valve, appendiceal orifice,                            and rectum were photographed. The quality of the                            bowel preparation was excellent. The colonoscopy                            was performed without difficulty. The patient                            tolerated the procedure well. Scope In: 11:01:54 AM Scope Out: 11:13:42 AM Scope Withdrawal Time: 0 hours 7 minutes 52 seconds  Total Procedure Duration: 0 hours 11 minutes 48 seconds  Findings:                 The perianal and digital rectal examinations were                            normal.                           Internal hemorrhoids were found during                            retroflexion. The hemorrhoids were medium-sized and                            Grade I (internal hemorrhoids that do not prolapse).                           Multiple medium-mouthed diverticula were found in                            the sigmoid colon.  The exam was otherwise without abnormality on                            direct and retroflexion views. Complications:            No immediate complications. Estimated blood loss:                            None. Estimated Blood Loss:     Estimated blood loss: none. Impression:               - Internal hemorrhoids.                           - Diverticulosis in the sigmoid colon.                           - The examination was otherwise normal on direct                            and retroflexion views.                           - No specimens collected. Recommendation:           - Repeat colonoscopy in 10 years for screening                            purposes.                           - Patient has a contact number available for                            emergencies. The signs and symptoms of potential                            delayed complications were discussed with  the                            patient. Return to normal activities tomorrow.                            Written discharge instructions were provided to the                            patient.                           - High fiber diet.                           - Continue present medications. Meryl Dare, MD 09/09/2016 11:17:56 AM This report has been signed electronically.

## 2016-09-09 NOTE — Patient Instructions (Signed)
YOU HAD AN ENDOSCOPIC PROCEDURE TODAY AT THE [facility]:   Refer to the procedure report that was given to you for any specific questions about what was found during the examination.  If the procedure report does not answer your questions, please call your gastroenterologist to clarify.  If you requested that your care partner not be given the details of your procedure findings, then the procedure report has been included in a sealed envelope for you to review at your convenience later.  YOU SHOULD EXPECT: Some feelings of bloating in the abdomen. Passage of more gas than usual.  Walking can help get rid of the air that was put into your GI tract during the procedure and reduce the bloating. If you had a lower endoscopy (such as a colonoscopy or flexible sigmoidoscopy) you may notice spotting of blood in your stool or on the toilet paper. If you underwent a bowel prep for your procedure, you may not have a normal bowel movement for a few days.  Please Note:  You might notice some irritation and congestion in your nose or some drainage.  This is from the oxygen used during your procedure.  There is no need for concern and it should clear up in a day or so.  SYMPTOMS TO REPORT IMMEDIATELY:   Following lower endoscopy (colonoscopy or flexible sigmoidoscopy):  Excessive amounts of blood in the stool  Significant tenderness or worsening of abdominal pains  Swelling of the abdomen that is new, acute  Fever of 100F or higher  For urgent or emergent issues, a gastroenterologist can be reached at any hour by calling [phone].   DIET:  We do recommend a small meal at first, but then you may proceed to your regular diet.  Drink plenty of fluids but you should avoid alcoholic beverages for 24 hours. We recommend following a High Fiber Diet. Please see hand-out given to you at discharge.  ACTIVITY:  You should plan to take it easy for the rest of today and you should NOT DRIVE or use  heavy machinery until tomorrow (because of the sedation medicines used during the test).    FOLLOW UP: Our staff will call the number listed on your records the next business day following your procedure to check on you and address any questions or concerns that you may have regarding the information given to you following your procedure. If we do not reach you, we will leave a message.  However, if you are feeling well and you are not experiencing any problems, there is no need to return our call.  We will assume that you have returned to your regular daily activities without incident.  If any biopsies were taken you will be contacted by phone or by letter within the next 1-3 weeks.  Please call us at [phone] if you have not heard about the biopsies in 3 weeks.    SIGNATURES/CONFIDENTIALITY: You and/or your care partner have signed paperwork which will be entered into your electronic medical record.  These signatures attest to the fact that that the information above on your After Visit Summary has been reviewed and is understood.  Full responsibility of the confidentiality of this discharge information lies with you and/or your care-partner.

## 2016-09-10 ENCOUNTER — Telehealth: Payer: Self-pay | Admitting: *Deleted

## 2016-09-10 NOTE — Telephone Encounter (Signed)
  Follow up Call-  Call back number 09/09/2016  Post procedure Call Back phone  # [phone]  Permission to leave phone message Yes  Some recent data might be hidden     No answer at # given. Left message on VM.

## 2016-09-10 NOTE — Telephone Encounter (Signed)
No answer left message will call back later this afternoon. SM

## 2016-09-13 ENCOUNTER — Telehealth: Payer: Self-pay | Admitting: Family Medicine

## 2016-09-13 NOTE — Telephone Encounter (Signed)
Please advise. TL/CM

## 2016-09-13 NOTE — Telephone Encounter (Signed)
One was called in on 2/9. TY.

## 2016-09-13 NOTE — Telephone Encounter (Signed)
Relation to pt: self Call back number:[phone] Pharmacy: [facility] [place] - [address] [phone] (Phone) [phone] (Fax)     Reason for call:  Patient requesting ZPACK due to coughing and congestion, patient last seen 09/06/16, please advise

## 2016-09-16 NOTE — Telephone Encounter (Signed)
I have informed patient of Dr. Carmelia RollerWendling recommendation. I have also informed patient if he is not getting better he should schedule an appointment to follow up. TL/CMA

## 2016-09-19 MED FILL — TESTOSTERONE CYP 200 MG/ML: 200 | 84 days supply | Qty: 6 | Fill #2

## 2016-09-25 ENCOUNTER — Other Ambulatory Visit: Payer: Self-pay | Admitting: Family Medicine

## 2016-10-02 MED FILL — ALLOPURINOL 100 MG TABLET: 100 | 30 days supply | Qty: 60 | Fill #1

## 2016-10-02 MED FILL — ATORVASTATIN 10 MG TABLET: 10 | 90 days supply | Qty: 90 | Fill #0

## 2016-10-24 MED FILL — AMOXICILLIN 500 MG CAPSULE: 500 | 7 days supply | Qty: 28 | Fill #0

## 2016-10-31 MED FILL — ACETAMINOPHEN/COD #3 TABLET: 300-30 | 3 days supply | Qty: 12 | Fill #0

## 2016-11-26 MED FILL — FLUTICASONE PROP 50 MCG SPR: 50 | 90 days supply | Qty: 48 | Fill #3

## 2016-11-26 MED FILL — LISINOPRIL 20 MG TABLET: 20 | 90 days supply | Qty: 90 | Fill #3

## 2016-12-02 ENCOUNTER — Other Ambulatory Visit: Payer: Self-pay | Admitting: Family Medicine

## 2016-12-02 MED FILL — PROPRANOLOL 40 MG TABLET: 40 | 90 days supply | Qty: 90 | Fill #0

## 2016-12-02 MED FILL — ALLOPURINOL 100 MG TABLET: 100 | 30 days supply | Qty: 60 | Fill #2

## 2016-12-09 ENCOUNTER — Telehealth: Payer: Self-pay | Admitting: Family Medicine

## 2016-12-09 MED ORDER — TESTOSTERONE CYPIONATE 200 MG/ML IM SOLN: INTRAMUSCULAR | 0 refills | Status: DC

## 2016-12-09 MED FILL — OMEPRAZOLE DR 20 MG CAPSULE: 20 | 90 days supply | Qty: 90 | Fill #1

## 2016-12-09 MED FILL — TESTOSTERONE CYP 200 MG/ML: 200 | 84 days supply | Qty: 6 | Fill #0

## 2016-12-09 NOTE — Telephone Encounter (Signed)
Requesting:   testosterone Contract    08/25/14 UDS   Low risk 02/23/2015 Last OV    07/04/2016-----=future appt is on 01/09/2017 Last Refill   10 ml with 1 refill on 03/29/2016  Please Advise Pharmacy---[facility] [phone]

## 2016-12-09 NOTE — Telephone Encounter (Signed)
Pt brought medical exam form for [employer] he is working there this summer. Put in front office.

## 2016-12-09 NOTE — Telephone Encounter (Signed)
OK to give RF without further refills. Can update paperwork at appt in June.

## 2016-12-09 NOTE — Telephone Encounter (Signed)
Faxed hardcopy to [facility]. 

## 2016-12-11 NOTE — Telephone Encounter (Addendum)
Form received.  Pt's last seen by Dr. Abner GreenspanBlyth on 07/04/16 for follow up.  Afterwards, he was seen twice by providers in office for acute visits.  Given form requesting a medical examination.  Pt will need an appt before form can be completed.  Pt has an annual exam scheduled for 01/09/17.    Last CPE: 11/28/15  Called patient and explained need for an appt.  CPE moved up to 12/12/16 with Dr. Abner GreenspanBlyth.  [place]

## 2016-12-12 ENCOUNTER — Encounter: Payer: Self-pay | Admitting: Family Medicine

## 2016-12-12 ENCOUNTER — Ambulatory Visit (INDEPENDENT_AMBULATORY_CARE_PROVIDER_SITE_OTHER): Payer: BC Managed Care – PPO | Admitting: Family Medicine

## 2016-12-12 VITALS — BP 128/80 | HR 64 | Temp 98.4°F | Ht 74.0 in | Wt 207.8 lb

## 2016-12-12 DIAGNOSIS — R739 Hyperglycemia, unspecified: Secondary | ICD-10-CM | POA: Diagnosis not present

## 2016-12-12 DIAGNOSIS — E349 Endocrine disorder, unspecified: Secondary | ICD-10-CM | POA: Diagnosis not present

## 2016-12-12 DIAGNOSIS — R351 Nocturia: Secondary | ICD-10-CM | POA: Diagnosis not present

## 2016-12-12 DIAGNOSIS — E785 Hyperlipidemia, unspecified: Secondary | ICD-10-CM | POA: Diagnosis not present

## 2016-12-12 DIAGNOSIS — G4700 Insomnia, unspecified: Secondary | ICD-10-CM

## 2016-12-12 DIAGNOSIS — I10 Essential (primary) hypertension: Secondary | ICD-10-CM | POA: Diagnosis not present

## 2016-12-12 DIAGNOSIS — E559 Vitamin D deficiency, unspecified: Secondary | ICD-10-CM | POA: Diagnosis not present

## 2016-12-12 DIAGNOSIS — Z0000 Encounter for general adult medical examination without abnormal findings: Secondary | ICD-10-CM | POA: Diagnosis not present

## 2016-12-12 DIAGNOSIS — M1A9XX0 Chronic gout, unspecified, without tophus (tophi): Secondary | ICD-10-CM | POA: Diagnosis not present

## 2016-12-12 DIAGNOSIS — I8393 Asymptomatic varicose veins of bilateral lower extremities: Secondary | ICD-10-CM

## 2016-12-12 HISTORY — DX: Asymptomatic varicose veins of bilateral lower extremities: I83.93

## 2016-12-12 LAB — CBC
HEMATOCRIT: 53.2 % — AB (ref 39.0–52.0)
Hemoglobin: 17.8 g/dL — ABNORMAL HIGH (ref 13.0–17.0)
MCHC: 33.5 g/dL (ref 30.0–36.0)
MCV: 85.1 fl (ref 78.0–100.0)
Platelets: 217 10*3/uL (ref 150.0–400.0)
RBC: 6.25 Mil/uL — ABNORMAL HIGH (ref 4.22–5.81)
RDW: 15.3 % (ref 11.5–15.5)
WBC: 9.2 10*3/uL (ref 4.0–10.5)

## 2016-12-12 LAB — COMPREHENSIVE METABOLIC PANEL
ALT: 27 U/L (ref 0–53)
AST: 22 U/L (ref 0–37)
Albumin: 4.5 g/dL (ref 3.5–5.2)
Alkaline Phosphatase: 45 U/L (ref 39–117)
BUN: 14 mg/dL (ref 6–23)
CHLORIDE: 103 meq/L (ref 96–112)
CO2: 26 mEq/L (ref 19–32)
Calcium: 9.5 mg/dL (ref 8.4–10.5)
Creatinine, Ser: 1.03 mg/dL (ref 0.40–1.50)
GFR: 77.69 mL/min (ref >60.00)
GLUCOSE: 98 mg/dL (ref 70–99)
POTASSIUM: 4.2 meq/L (ref 3.5–5.1)
SODIUM: 135 meq/L (ref 135–145)
Total Bilirubin: 1.1 mg/dL (ref 0.2–1.2)
Total Protein: 7 g/dL (ref 6.0–8.3)

## 2016-12-12 LAB — URINALYSIS
BILIRUBIN URINE: NEGATIVE
Hgb urine dipstick: NEGATIVE
KETONES UR: NEGATIVE
Leukocytes, UA: NEGATIVE
NITRITE: NEGATIVE
PH: 6 (ref 5.0–8.0)
Specific Gravity, Urine: <=1.005 — AB (ref 1.000–1.030)
TOTAL PROTEIN, URINE-UPE24: NEGATIVE
Urine Glucose: NEGATIVE
Urobilinogen, UA: 0.2 (ref 0.0–1.0)

## 2016-12-12 LAB — VITAMIN D 25 HYDROXY (VIT D DEFICIENCY, FRACTURES): VITD: 43.58 ng/mL (ref 30.00–100.00)

## 2016-12-12 LAB — LIPID PANEL
Cholesterol: 134 mg/dL (ref 0–200)
HDL: 44.6 mg/dL (ref >39.00)
LDL CALC: 63 mg/dL (ref 0–99)
NONHDL: 89.68
Total CHOL/HDL Ratio: 3
Triglycerides: 133 mg/dL (ref 0.0–149.0)
VLDL: 26.6 mg/dL (ref 0.0–40.0)

## 2016-12-12 LAB — HEMOGLOBIN A1C: Hgb A1c MFr Bld: 5.6 % (ref 4.6–6.5)

## 2016-12-12 LAB — TSH: TSH: 0.42 u[IU]/mL (ref 0.35–4.50)

## 2016-12-12 LAB — TESTOSTERONE: Testosterone: 619.37 ng/dL (ref 300.00–890.00)

## 2016-12-12 MED ORDER — ZOLPIDEM TARTRATE 10 MG PO TABS: 5.0000 mg | ORAL_TABLET | Freq: Every evening | ORAL | 1 refills | Status: DC | PRN

## 2016-12-12 MED ORDER — ROPINIROLE HCL 0.5 MG PO TABS: 0.5000 mg | ORAL_TABLET | Freq: Every day | ORAL | 3 refills | Status: DC

## 2016-12-12 MED FILL — rOPINIRole HCL 0.5 MG TABS: 0.5 | 30 days supply | Qty: 60 | Fill #0

## 2016-12-12 MED FILL — ZOLPIDEM TARTRATE 10 MG TAB: 10 | 15 days supply | Qty: 15 | Fill #0

## 2016-12-12 NOTE — Assessment & Plan Note (Signed)
hgba1c acceptable, minimize simple carbs. Increase exercise as tolerated.  

## 2016-12-12 NOTE — Assessment & Plan Note (Signed)
Well controlled, no changes to meds. Encouraged heart healthy diet such as the DASH diet and exercise as tolerated.  °

## 2016-12-12 NOTE — Assessment & Plan Note (Signed)
Encouraged good sleep hygiene such as dark, quiet room. No blue/green glowing lights such as computer screens in bedroom. No alcohol or stimulants in evening. Cut down on caffeine as able. Regular exercise is helpful but not just prior to bed time. Last prescription of Ambien was from 2016, only 15 tabs at that time. Still has a few but requests a refill.

## 2016-12-12 NOTE — Progress Notes (Signed)
Patient ID: Taylor Mcdonald, male   DOB: 01/28/1956, 61 y.o.   MRN: [id number]   Subjective:  I acted as a Neurosurgeon for Danise Edge, MD. [place], [place]   Patient ID: Taylor Mcdonald, male    DOB: 19-Jul-1956, 61 y.o.   MRN: [id number]  Chief Complaint  Patient presents with  . Annual Exam  . Hypertension  . Hyperlipidemia    Hypertension  This is a chronic problem. The problem is controlled. Pertinent negatives include no blurred vision, chest pain, headaches, malaise/fatigue, palpitations or shortness of breath. Risk factors for coronary artery disease include male gender.  Hyperlipidemia  This is a chronic problem. The problem is controlled. Pertinent negatives include no chest pain or shortness of breath. Risk factors for coronary artery disease include hypertension and male sex.    Patient is in today for an annual examination. Patient has a Hx of HTN, GERD, arthritis, gout, testosterone deficiency, hyperlipidemia, hyperglycemia. Patient has no acute concerns noted at this time. 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 No recent febrile illness or hospitalizations. His varicose veins are not painful but continue to enlarge. Doing well maintaining heart healthy diet and regular exercise.  Patient Care Team: Bradd Canary, MD as PCP - General (Family Medicine)   Past Medical History:  Diagnosis Date  . Allergy   . Anxiety 03/04/2012  . Fatigue   . GERD (gastroesophageal reflux disease)   . Gout   . Hyperglycemia   . Hyperlipidemia   . Hypertension   . Insomnia   . Internal nasal lesion 07/04/2016  . Low testosterone   . Lower back pain 09/09/2012  . Migraine   . Pain of left heel 10/01/2011  . Preventative health care 07/24/2012  . RLS (restless legs syndrome)   . Sinusitis 03/04/2012  . Varicose veins of both lower extremities 12/12/2016  . Vasovagal near syncope 04/30/2013  . Vitamin D deficiency 12/23/2011    Past  Surgical History:  Procedure Laterality Date  . LUMBAR LAMINECTOMY/DECOMPRESSION MICRODISCECTOMY N/A 05/13/2013   Procedure: LUMBAR LAMINECTOMY/DECOMPRESSION MICRODISCECTOMY 1 LEVEL L3-4;  Surgeon: Javier Docker, MD;  Location: [facility];  Service: Orthopedics;  Laterality: N/A;  . PANENDOSCOPY    . TONSILLECTOMY      Family History  Problem Relation Age of Onset  . Obesity Mother   . Hypertension Son   . Hypertension Maternal Grandmother   . Obesity Maternal Grandmother   . Cancer Maternal Grandmother 50       intestinal  . Dementia Maternal Grandfather   . Colon cancer Neg Hx     Social History   Social History  . Marital status: Married    Spouse name: N/A  . Number of children: N/A  . Years of education: N/A   Occupational History  . Not on file.   Social History Main Topics  . Smoking status: Never Smoker  . Smokeless tobacco: Never Used  . Alcohol use No  . Drug use: Yes    Types: Hydrocodone  . Sexual activity: Yes   Other Topics Concern  . Not on file   Social History Narrative  . No narrative on file    Outpatient Medications Prior to Visit  Medication Sig Dispense Refill  . allopurinol (ZYLOPRIM) 100 MG tablet TAKE 2 TABLETS (200 MG TOTAL) BY MOUTH DAILY. (Patient taking differently: TAKE 1 TABLETS (200 MG TOTAL) BY MOUTH DAILY.) 60 tablet 5  .  aspirin 81 MG tablet Take 1 tablet (81 mg total) by mouth daily. Resume 4 days post-op 30 tablet   . atorvastatin (LIPITOR) 10 MG tablet TAKE 1 TABLET BY MOUTH DAILY. 90 tablet 1  . Fish Oil-Cholecalciferol (FISH OIL + D3) 1200-1000 MG-UNIT CAPS Take 1 capsule by mouth daily.    . fluticasone (FLONASE) 50 MCG/ACT nasal spray PLACE 2 SPRAYS INTO THE NOSE DAILY. 48 g 3  . lisinopril (PRINIVIL,ZESTRIL) 20 MG tablet TAKE 1 TABLET (20 MG TOTAL) BY MOUTH DAILY. 90 tablet 3  . omeprazole (PRILOSEC) 20 MG capsule TAKE 1 CAPSULE (20 MG TOTAL) BY MOUTH DAILY. 90 capsule 2  . OVER THE COUNTER MEDICATION 12 hr decongestant  120mg     . promethazine-codeine (PHENERGAN WITH CODEINE) 6.25-10 MG/5ML syrup Take 5 mLs by mouth every 6 (six) hours as needed for cough. 120 mL 0  . propranolol (INDERAL) 40 MG tablet TAKE 1 TABLET (40 MG TOTAL) BY MOUTH DAILY. 90 tablet 0  . testosterone cypionate (DEPOTESTOSTERONE CYPIONATE) 200 MG/ML injection INJECT 1 ML INTO THE MUSCLE EVERY 14 DAYS 10 mL 0  . azithromycin (ZITHROMAX) 250 MG tablet Take 2 tabs the first day and then 1 tab daily until you run out. (Patient not taking: Reported on 12/12/2016) 6 tablet 0  . 0.9 %  sodium chloride infusion      No facility-administered medications prior to visit.     No Known Allergies  Review of Systems  Constitutional: Negative for fever and malaise/fatigue.  HENT: Negative for congestion.   Eyes: Negative for blurred vision.  Respiratory: Negative for cough and shortness of breath.   Cardiovascular: Negative for chest pain, palpitations and leg swelling.  Gastrointestinal: Negative for vomiting.  Musculoskeletal: Negative for back pain.  Skin: Negative for rash.  Neurological: Negative for loss of consciousness and headaches.  Psychiatric/Behavioral: Negative for depression. The patient has insomnia.        Objective:    Physical Exam  Constitutional: He is oriented to person, place, and time. He appears well-developed and well-nourished. No distress.  HENT:  Head: Normocephalic and atraumatic.  Eyes: Conjunctivae are normal.  Neck: Normal range of motion. No thyromegaly present.  Cardiovascular: Normal rate and regular rhythm.   Pulmonary/Chest: Effort normal and breath sounds normal. He has no wheezes.  Abdominal: Soft. Bowel sounds are normal. There is no tenderness.  Musculoskeletal: He exhibits no edema or deformity.  Varicose veins diffusely b/l lower extremities R>L.  Neurological: He is alert and oriented to person, place, and time.  Skin: Skin is warm and dry. He is not diaphoretic.  Psychiatric: He has a normal  mood and affect.    BP 128/80 (BP Location: Left Arm, Patient Position: Sitting, Cuff Size: Normal)   Pulse 64   Temp 98.4 F (36.9 C) (Oral)   Ht 6\' 2"  (1.88 m)   Wt 207 lb 12.8 oz (94.3 kg)   SpO2 100% Comment: RA  BMI 26.68 kg/m  Wt Readings from Last 3 Encounters:  12/12/16 207 lb 12.8 oz (94.3 kg)  09/09/16 211 lb (95.7 kg)  09/06/16 211 lb 6.4 oz (95.9 kg)   BP Readings from Last 3 Encounters:  12/12/16 128/80  09/09/16 106/60  09/06/16 110/70     Immunization History  Administered Date(s) Administered  . Influenza Whole 07/25/2009, 05/12/2012  . Influenza,inj,Quad PF,36+ Mos 08/18/2013  . Influenza-Unspecified 04/28/2014, 06/27/2016  . Td 03/06/2005, 05/15/2010    Health Maintenance  Topic Date Due  . Hepatitis C Screening  1955-10-17  . HIV Screening  08/10/1969  . INFLUENZA VACCINE  02/26/2017  . TETANUS/TDAP  05/15/2020  . COLONOSCOPY  09/09/2026    Lab Results  Component Value Date   WBC 9.2 06/27/2016   HGB 16.8 06/27/2016   HCT 49.8 06/27/2016   PLT 250.0 06/27/2016   GLUCOSE 109 (H) 06/27/2016   CHOL 132 06/27/2016   TRIG 180.0 (H) 06/27/2016   HDL 41.60 06/27/2016   LDLDIRECT 125.7 02/10/2013   LDLCALC 54 06/27/2016   ALT 30 06/27/2016   AST 21 06/27/2016   NA 138 06/27/2016   K 4.8 06/27/2016   CL 103 06/27/2016   CREATININE 1.18 06/27/2016   BUN 18 06/27/2016   CO2 29 06/27/2016   TSH 0.52 06/27/2016   PSA 0.63 11/23/2015   HGBA1C 5.3 06/27/2016    Lab Results  Component Value Date   TSH 0.52 06/27/2016   Lab Results  Component Value Date   WBC 9.2 06/27/2016   HGB 16.8 06/27/2016   HCT 49.8 06/27/2016   MCV 86.9 06/27/2016   PLT 250.0 06/27/2016   Lab Results  Component Value Date   NA 138 06/27/2016   K 4.8 06/27/2016   CO2 29 06/27/2016   GLUCOSE 109 (H) 06/27/2016   BUN 18 06/27/2016   CREATININE 1.18 06/27/2016   BILITOT 0.8 06/27/2016   ALKPHOS 45 06/27/2016   AST 21 06/27/2016   ALT 30 06/27/2016   PROT  6.3 06/27/2016   ALBUMIN 4.1 06/27/2016   CALCIUM 9.2 06/27/2016   GFR 66.51 06/27/2016   Lab Results  Component Value Date   CHOL 132 06/27/2016   Lab Results  Component Value Date   HDL 41.60 06/27/2016   Lab Results  Component Value Date   LDLCALC 54 06/27/2016   Lab Results  Component Value Date   TRIG 180.0 (H) 06/27/2016   Lab Results  Component Value Date   CHOLHDL 3 06/27/2016   Lab Results  Component Value Date   HGBA1C 5.3 06/27/2016         Assessment & Plan:   Problem List Items Addressed This Visit    Gout    No recent flares. Check level today      Hypertension    Well controlled, no changes to meds. Encouraged heart healthy diet such as the DASH diet and exercise as tolerated.       Relevant Orders   CBC   Comprehensive metabolic panel   TSH   Testosterone deficiency    Check level today and continue current dose      Relevant Orders   Testosterone   Insomnia    Encouraged good sleep hygiene such as dark, quiet room. No blue/green glowing lights such as computer screens in bedroom. No alcohol or stimulants in evening. Cut down on caffeine as able. Regular exercise is helpful but not just prior to bed time. Last prescription of Ambien was from 2016, only 15 tabs at that time. Still has a few but requests a refill.       Hyperlipidemia    Tolerating statin, encouraged heart healthy diet, avoid trans fats, minimize simple carbs and saturated fats. Increase exercise as tolerated      Relevant Orders   Lipid panel   Hyperglycemia    hgba1c acceptable, minimize simple carbs. Increase exercise as tolerated.       Relevant Orders   Hemoglobin A1c   Vitamin D deficiency    Check vitamin D level today. Only taking an  MVI      Relevant Orders   VITAMIN D 25 Hydroxy (Vit-D Deficiency, Fractures)   Preventative health care - Primary    Patient encouraged to maintain heart healthy diet, regular exercise, adequate sleep. Consider daily  probiotics. Take medications as prescribed. Given and reviewed copy of ACP documents from [employer] and encouraged to complete and return.      Nocturia   Relevant Orders   Urinalysis   Varicose veins of both lower extremities    Not painful but enlarging significantly he is given paperwork on compression hose and he will let us know if he wants to have a vascular surgery consultation          I have discontinued Mr. Favia zolpidem, zolpidem, zolpidem, and azithromycin. I am also having him start on zolpidem and rOPINIRole. Additionally, I am having him maintain his FISH OIL + D3, aspirin, lisinopril, fluticasone, omeprazole, allopurinol, OVER THE COUNTER MEDICATION, promethazine-codeine, atorvastatin, propranolol, and testosterone cypionate. We will stop administering sodium chloride.  Meds ordered this encounter  Medications  . zolpidem (AMBIEN) 10 MG tablet    Sig: Take 0.5-1 tablets (5-10 mg total) by mouth at bedtime as needed for sleep.    Dispense:  15 tablet    Refill:  1  . rOPINIRole (REQUIP) 0.5 MG tablet    Sig: Take 1-2 tablets (0.5-1 mg total) by mouth at bedtime.    Dispense:  60 tablet    Refill:  3    CMA served as scribe during this visit. History, Physical and Plan performed by medical provider. Documentation and orders reviewed and attested to.  Danise Edge, MD

## 2016-12-12 NOTE — Assessment & Plan Note (Addendum)
Check level today and continue current dose

## 2016-12-12 NOTE — Patient Instructions (Addendum)
Shingrix shot for shingles call insurance and ask if they cover.   Preventive Care 40-64 Years, Male Preventive care refers to lifestyle choices and visits with your health care provider that can promote health and wellness. What does preventive care include?  A yearly physical exam. This is also called an annual well check.  Dental exams once or twice a year.  Routine eye exams. Ask your health care provider how often you should have your eyes checked.  Personal lifestyle choices, including:  Daily care of your teeth and gums.  Regular physical activity.  Eating a healthy diet.  Avoiding tobacco and drug use.  Limiting alcohol use.  Practicing safe sex.  Taking low-dose aspirin every day starting at age 43. What happens during an annual well check? The services and screenings done by your health care provider during your annual well check will depend on your age, overall health, lifestyle risk factors, and family history of disease. Counseling  Your health care provider may ask you questions about your:  Alcohol use.  Tobacco use.  Drug use.  Emotional well-being.  Home and relationship well-being.  Sexual activity.  Eating habits.  Work and work Statistician. Screening  You may have the following tests or measurements:  Height, weight, and BMI.  Blood pressure.  Lipid and cholesterol levels. These may be checked every 5 years, or more frequently if you are over 29 years old.  Skin check.  Lung cancer screening. You may have this screening every year starting at age 24 if you have a 30-pack-year history of smoking and currently smoke or have quit within the past 15 years.  Fecal occult blood test (FOBT) of the stool. You may have this test every year starting at age 78.  Flexible sigmoidoscopy or colonoscopy. You may have a sigmoidoscopy every 5 years or a colonoscopy every 10 years starting at age 28.  Prostate cancer screening. Recommendations will  vary depending on your family history and other risks.  Hepatitis C blood test.  Hepatitis B blood test.  Sexually transmitted disease (STD) testing.  Diabetes screening. This is done by checking your blood sugar (glucose) after you have not eaten for a while (fasting). You may have this done every 1-3 years. Discuss your test results, treatment options, and if necessary, the need for more tests with your health care provider. Vaccines  Your health care provider may recommend certain vaccines, such as:  Influenza vaccine. This is recommended every year.  Tetanus, diphtheria, and acellular pertussis (Tdap, Td) vaccine. You may need a Td booster every 10 years.  Varicella vaccine. You may need this if you have not been vaccinated.  Zoster vaccine. You may need this after age 32.  Measles, mumps, and rubella (MMR) vaccine. You may need at least one dose of MMR if you were born in 1957 or later. You may also need a second dose.  Pneumococcal 13-valent conjugate (PCV13) vaccine. You may need this if you have certain conditions and have not been vaccinated.  Pneumococcal polysaccharide (PPSV23) vaccine. You may need one or two doses if you smoke cigarettes or if you have certain conditions.  Meningococcal vaccine. You may need this if you have certain conditions.  Hepatitis A vaccine. You may need this if you have certain conditions or if you travel or work in places where you may be exposed to hepatitis A.  Hepatitis B vaccine. You may need this if you have certain conditions or if you travel or work in places where  you may be exposed to hepatitis B.  Haemophilus influenzae type b (Hib) vaccine. You may need this if you have certain risk factors. Talk to your health care provider about which screenings and vaccines you need and how often you need them. This information is not intended to replace advice given to you by your health care provider. Make sure you discuss any questions you  have with your health care provider. Document Released: 08/11/2015 Document Revised: 04/03/2016 Document Reviewed: 05/16/2015 Elsevier Interactive Patient Education  2017 [employer].

## 2016-12-12 NOTE — Assessment & Plan Note (Signed)
Tolerating statin, encouraged heart healthy diet, avoid trans fats, minimize simple carbs and saturated fats. Increase exercise as tolerated 

## 2016-12-12 NOTE — Assessment & Plan Note (Signed)
No recent flares. Check level today

## 2016-12-12 NOTE — Assessment & Plan Note (Signed)
Patient encouraged to maintain heart healthy diet, regular exercise, adequate sleep. Consider daily probiotics. Take medications as prescribed. Given and reviewed copy of ACP documents from [place] Secretary of State and encouraged to complete and return 

## 2016-12-12 NOTE — Progress Notes (Signed)
Pre visit review using our clinic review tool, if applicable. No additional management support is needed unless otherwise documented below in the visit note. 

## 2016-12-12 NOTE — Assessment & Plan Note (Addendum)
Check vitamin D level today. Only taking an MVI

## 2016-12-12 NOTE — Assessment & Plan Note (Signed)
Not painful but enlarging significantly he is given paperwork on compression hose and he will let us know if he wants to have a vascular surgery consultation

## 2016-12-13 ENCOUNTER — Other Ambulatory Visit: Payer: Self-pay | Admitting: Family Medicine

## 2016-12-13 DIAGNOSIS — D582 Other hemoglobinopathies: Secondary | ICD-10-CM

## 2017-01-02 MED FILL — ATORVASTATIN 10 MG TABLET: 10 | 90 days supply | Qty: 90 | Fill #1

## 2017-01-03 ENCOUNTER — Other Ambulatory Visit: Payer: BC Managed Care – PPO

## 2017-01-03 ENCOUNTER — Other Ambulatory Visit (INDEPENDENT_AMBULATORY_CARE_PROVIDER_SITE_OTHER): Payer: BC Managed Care – PPO

## 2017-01-03 DIAGNOSIS — D582 Other hemoglobinopathies: Secondary | ICD-10-CM | POA: Diagnosis not present

## 2017-01-03 LAB — CBC
HEMATOCRIT: 52.9 % — AB (ref 39.0–52.0)
HEMOGLOBIN: 17.5 g/dL — AB (ref 13.0–17.0)
MCHC: 33.1 g/dL (ref 30.0–36.0)
MCV: 84.6 fl (ref 78.0–100.0)
Platelets: 234 10*3/uL (ref 150.0–400.0)
RBC: 6.25 Mil/uL — AB (ref 4.22–5.81)
RDW: 15.5 % (ref 11.5–15.5)
WBC: 8.3 10*3/uL (ref 4.0–10.5)

## 2017-01-09 ENCOUNTER — Encounter: Payer: BC Managed Care – PPO | Admitting: Family Medicine

## 2017-01-13 ENCOUNTER — Other Ambulatory Visit: Payer: BC Managed Care – PPO

## 2017-02-04 ENCOUNTER — Other Ambulatory Visit: Payer: Self-pay | Admitting: Family Medicine

## 2017-02-04 MED FILL — FLUTICASONE PROP 50 MCG SPR: 50 | 90 days supply | Qty: 48 | Fill #0

## 2017-02-07 MED FILL — FLUOROURACIL 5% CREAM: 5 | 14 days supply | Qty: 40 | Fill #0

## 2017-02-17 MED FILL — ALLOPURINOL 100 MG TABLET: 100 | 30 days supply | Qty: 60 | Fill #3

## 2017-03-03 ENCOUNTER — Other Ambulatory Visit: Payer: Self-pay | Admitting: Family Medicine

## 2017-03-03 MED FILL — LISINOPRIL 20 MG TABLET: 20 | 90 days supply | Qty: 90 | Fill #0

## 2017-03-03 MED FILL — PROPRANOLOL 40 MG TABLET: 40 | 90 days supply | Qty: 90 | Fill #0

## 2017-03-17 ENCOUNTER — Telehealth: Payer: Self-pay | Admitting: Family Medicine

## 2017-03-17 ENCOUNTER — Telehealth: Payer: Self-pay

## 2017-03-17 MED FILL — TESTOSTERONE CYP 200 MG/ML: 200 | 56 days supply | Qty: 4 | Fill #1

## 2017-03-17 NOTE — Telephone Encounter (Signed)
PA initiated via Covermymeds; KEY: [id number]. Awaiting determination.

## 2017-03-17 NOTE — Telephone Encounter (Signed)
PA approved through 03/17/2018.

## 2017-03-17 NOTE — Telephone Encounter (Signed)
Pt dropped off document to be filled out by provider (Department of Transportation Document - 2 pages in a white small envelope). Pt would like to be called at [phone] when document ready to pick up. Document put at front office tray.

## 2017-03-24 NOTE — Telephone Encounter (Signed)
Paperwork was placed on my desk after leaving on Friday; just received Monday, 08/027/18, will forward to provider for completion/SLS 08/27 Per provider VO, researched in Media in EMR and found the last DOT paperwork that we completed in 06/2016, with all normal pages attached. This form only has Cardio & Endo on the sheet, and provider did complete on last paperwork. Also, did call and speak with patient and he said that he was not expecting this paperwork and "doesn't know why they sent this one sheet out of the blue"; informed and forwarded to provider/SLS 03/28

## 2017-03-25 NOTE — Telephone Encounter (Signed)
Has Dr. Abner Greenspan returned this to you?Marland Kitchen Thanks/SLS 08/28

## 2017-03-26 NOTE — Telephone Encounter (Signed)
[place] [facility] [facility] [place] at [phone] for Medical Review Program; pt made aware, will leave copy at front desk; understood & agreed/SLS 08/29

## 2017-04-03 ENCOUNTER — Other Ambulatory Visit: Payer: Self-pay | Admitting: Family Medicine

## 2017-04-04 ENCOUNTER — Other Ambulatory Visit: Payer: Self-pay

## 2017-04-04 MED ORDER — ATORVASTATIN CALCIUM 10 MG PO TABS: 10.0000 mg | ORAL_TABLET | Freq: Every day | ORAL | 0 refills | Status: DC

## 2017-04-04 MED FILL — ATORVASTATIN 10 MG TABLET: 10 | 90 days supply | Qty: 90 | Fill #0

## 2017-04-04 NOTE — Telephone Encounter (Signed)
Faxed90d Lipitor till appt/thx dmf

## 2017-05-05 MED FILL — ALLOPURINOL 100 MG TABS: 100 | 30 days supply | Qty: 60 | Fill #4

## 2017-05-23 ENCOUNTER — Other Ambulatory Visit: Payer: Self-pay

## 2017-05-23 MED ORDER — TESTOSTERONE CYPIONATE 200 MG/ML IM SOLN: INTRAMUSCULAR | 0 refills | Status: DC

## 2017-05-23 NOTE — Telephone Encounter (Signed)
f °

## 2017-05-23 NOTE — Telephone Encounter (Signed)
Faxed hardcopy for Testosterone to [facility] [address] [place]

## 2017-05-23 NOTE — Telephone Encounter (Signed)
Requesting:testosterone cypionate Contract:no UDS:no Last OV:12/12/16 Next OV:06/16/17 Last Refill:12/09/16   #[id number]   Please advise

## 2017-05-27 MED FILL — FLUTICASONE PROP 50 MCG SPR: 50 | 90 days supply | Qty: 48 | Fill #1

## 2017-06-02 ENCOUNTER — Other Ambulatory Visit: Payer: Self-pay | Admitting: Family Medicine

## 2017-06-02 MED FILL — PROPRANOLOL 40 MG TABLET: 40 | 90 days supply | Qty: 90 | Fill #0

## 2017-06-10 MED FILL — LISINOPRIL 20 MG TABLET: 20 | 90 days supply | Qty: 90 | Fill #1

## 2017-06-16 ENCOUNTER — Ambulatory Visit (INDEPENDENT_AMBULATORY_CARE_PROVIDER_SITE_OTHER): Payer: BC Managed Care – PPO | Admitting: Family Medicine

## 2017-06-16 ENCOUNTER — Telehealth: Payer: Self-pay

## 2017-06-16 ENCOUNTER — Encounter: Payer: Self-pay | Admitting: Family Medicine

## 2017-06-16 VITALS — BP 122/64 | HR 64 | Temp 97.5°F | Resp 16 | Ht 74.02 in | Wt 193.2 lb

## 2017-06-16 DIAGNOSIS — Z23 Encounter for immunization: Secondary | ICD-10-CM

## 2017-06-16 DIAGNOSIS — E782 Mixed hyperlipidemia: Secondary | ICD-10-CM

## 2017-06-16 DIAGNOSIS — I10 Essential (primary) hypertension: Secondary | ICD-10-CM

## 2017-06-16 DIAGNOSIS — E349 Endocrine disorder, unspecified: Secondary | ICD-10-CM

## 2017-06-16 DIAGNOSIS — R739 Hyperglycemia, unspecified: Secondary | ICD-10-CM | POA: Diagnosis not present

## 2017-06-16 DIAGNOSIS — E559 Vitamin D deficiency, unspecified: Secondary | ICD-10-CM | POA: Diagnosis not present

## 2017-06-16 DIAGNOSIS — M1A9XX0 Chronic gout, unspecified, without tophus (tophi): Secondary | ICD-10-CM | POA: Diagnosis not present

## 2017-06-16 LAB — CBC
HCT: 52.3 % — ABNORMAL HIGH (ref 39.0–52.0)
Hemoglobin: 17.8 g/dL — ABNORMAL HIGH (ref 13.0–17.0)
MCHC: 34 g/dL (ref 30.0–36.0)
MCV: 88.4 fl (ref 78.0–100.0)
Platelets: 244 10*3/uL (ref 150.0–400.0)
RBC: 5.91 Mil/uL — ABNORMAL HIGH (ref 4.22–5.81)
RDW: 14.7 % (ref 11.5–15.5)
WBC: 8.4 10*3/uL (ref 4.0–10.5)

## 2017-06-16 LAB — URIC ACID: Uric Acid, Serum: 7.9 mg/dL — ABNORMAL HIGH (ref 4.0–7.8)

## 2017-06-16 LAB — COMPREHENSIVE METABOLIC PANEL
ALT: 27 U/L (ref 0–53)
AST: 23 U/L (ref 0–37)
Albumin: 4.5 g/dL (ref 3.5–5.2)
Alkaline Phosphatase: 47 U/L (ref 39–117)
BUN: 18 mg/dL (ref 6–23)
CALCIUM: 10 mg/dL (ref 8.4–10.5)
CHLORIDE: 99 meq/L (ref 96–112)
CO2: 30 meq/L (ref 19–32)
Creatinine, Ser: 1.05 mg/dL (ref 0.40–1.50)
GFR: 75.86 mL/min (ref >60.00)
GLUCOSE: 103 mg/dL — AB (ref 70–99)
Potassium: 4.8 mEq/L (ref 3.5–5.1)
SODIUM: 135 meq/L (ref 135–145)
TOTAL PROTEIN: 7.2 g/dL (ref 6.0–8.3)
Total Bilirubin: 1.3 mg/dL — ABNORMAL HIGH (ref 0.2–1.2)

## 2017-06-16 LAB — TESTOSTERONE: TESTOSTERONE: 127.38 ng/dL — AB (ref 300.00–890.00)

## 2017-06-16 LAB — LIPID PANEL
CHOL/HDL RATIO: 3
Cholesterol: 138 mg/dL (ref 0–200)
HDL: 50.3 mg/dL (ref >39.00)
LDL CALC: 67 mg/dL (ref 0–99)
NonHDL: 87.96
TRIGLYCERIDES: 107 mg/dL (ref 0.0–149.0)
VLDL: 21.4 mg/dL (ref 0.0–40.0)

## 2017-06-16 LAB — HEMOGLOBIN A1C: HEMOGLOBIN A1C: 5.5 % (ref 4.6–6.5)

## 2017-06-16 LAB — TSH: TSH: 0.51 u[IU]/mL (ref 0.35–4.50)

## 2017-06-16 MED ORDER — ZOLPIDEM TARTRATE 10 MG PO TABS: 5.0000 mg | ORAL_TABLET | Freq: Every evening | ORAL | 2 refills | Status: DC | PRN

## 2017-06-16 NOTE — Assessment & Plan Note (Signed)
Encouraged heart healthy diet, increase exercise, avoid trans fats, consider a krill oil cap daily 

## 2017-06-16 NOTE — Patient Instructions (Addendum)
shingrix is the new shingles shot, 2 shots over2-6 months, check with insurance to insure they will cover.  Hypertension Hypertension is another name for high blood pressure. High blood pressure forces your heart to work harder to pump blood. This can cause problems over time. There are two numbers in a blood pressure reading. There is a top number (systolic) over a bottom number (diastolic). It is best to have a blood pressure below 120/80. Healthy choices can help lower your blood pressure. You may need medicine to help lower your blood pressure if:  Your blood pressure cannot be lowered with healthy choices.  Your blood pressure is higher than 130/80.  Follow these instructions at home: Eating and drinking  If directed, follow the DASH eating plan. This diet includes: ? Filling half of your plate at each meal with fruits and vegetables. ? Filling one quarter of your plate at each meal with whole grains. Whole grains include whole wheat pasta, brown rice, and whole grain bread. ? Eating or drinking low-fat dairy products, such as skim milk or low-fat yogurt. ? Filling one quarter of your plate at each meal with low-fat (lean) proteins. Low-fat proteins include fish, skinless chicken, eggs, beans, and tofu. ? Avoiding fatty meat, cured and processed meat, or chicken with skin. ? Avoiding premade or processed food.  Eat less than 1,500 mg of salt (sodium) a day.  Limit alcohol use to no more than 1 drink a day for nonpregnant women and 2 drinks a day for men. One drink equals 12 oz of beer, 5 oz of wine, or 1 oz of hard liquor. Lifestyle  Work with your doctor to stay at a healthy weight or to lose weight. Ask your doctor what the best weight is for you.  Get at least 30 minutes of exercise that causes your heart to beat faster (aerobic exercise) most days of the week. This may include walking, swimming, or biking.  Get at least 30 minutes of exercise that strengthens your muscles  (resistance exercise) at least 3 days a week. This may include lifting weights or pilates.  Do not use any products that contain nicotine or tobacco. This includes cigarettes and e-cigarettes. If you need help quitting, ask your doctor.  Check your blood pressure at home as told by your doctor.  Keep all follow-up visits as told by your doctor. This is important. Medicines  Take over-the-counter and prescription medicines only as told by your doctor. Follow directions carefully.  Do not skip doses of blood pressure medicine. The medicine does not work as well if you skip doses. Skipping doses also puts you at risk for problems.  Ask your doctor about side effects or reactions to medicines that you should watch for. Contact a doctor if:  You think you are having a reaction to the medicine you are taking.  You have headaches that keep coming back (recurring).  You feel dizzy.  You have swelling in your ankles.  You have trouble with your vision. Get help right away if:  You get a very bad headache.  You start to feel confused.  You feel weak or numb.  You feel faint.  You get very bad pain in your: ? Chest. ? Belly (abdomen).  You throw up (vomit) more than once.  You have trouble breathing. Summary  Hypertension is another name for high blood pressure.  Making healthy choices can help lower blood pressure. If your blood pressure cannot be controlled with healthy choices, you may  need to take medicine. This information is not intended to replace advice given to you by your health care provider. Make sure you discuss any questions you have with your health care provider. Document Released: 01/01/2008 Document Revised: 06/12/2016 Document Reviewed: 06/12/2016 Elsevier Interactive Patient Education  [employer].

## 2017-06-16 NOTE — Progress Notes (Signed)
Subjective:  I acted as a Neurosurgeonscribe for Text[employer] IncDr.Reegan Bouffard. Fuller SongShaneka, RMA   Patient ID: Taylor Mcdonald, male    DOB: 12/26/1955, 61 y.o.   MRN: [id number]  Chief Complaint  Patient presents with  . Follow-up    HPI  Patient is in today for 6 month follow up and is feeling well. No recent febrile illness or acute hospitalizations. Still has trouble sleeping well. Trying not to take Ambien regularly. Has been trying Nyquil prn and that helps. Melatonin did not help. Denies CP/palp/SOB/HA/congestion/fevers/GI or GU c/o. Taking meds as prescribed. No recent gout flares.   Patient Care Team: Bradd CanaryBlyth, Merced Brougham A, MD as PCP - General (Family Medicine)   Past Medical History:  Diagnosis Date  . Allergy   . Anxiety 03/04/2012  . Fatigue   . GERD (gastroesophageal reflux disease)   . Gout   . Hyperglycemia   . Hyperlipidemia   . Hypertension   . Insomnia   . Internal nasal lesion 07/04/2016  . Low testosterone   . Lower back pain 09/09/2012  . Migraine   . Pain of left heel 10/01/2011  . Preventative health care 07/24/2012  . RLS (restless legs syndrome)   . Sinusitis 03/04/2012  . Varicose veins of both lower extremities 12/12/2016  . Vasovagal near syncope 04/30/2013  . Vitamin D deficiency 12/23/2011    Past Surgical History:  Procedure Laterality Date  . LUMBAR LAMINECTOMY/DECOMPRESSION MICRODISCECTOMY 1 LEVEL L3-4 N/A 05/13/2013   Performed [facility] Javier DockerBeane, Jeffrey C[facility]  . PANENDOSCOPY    . TONSILLECTOMY      Family History  Problem Relation Age of Onset  . Obesity Mother   . Hypertension Son   . Hypertension Maternal Grandmother   . Obesity Maternal Grandmother   . Cancer Maternal Grandmother 50       intestinal  . Dementia Maternal Grandfather   . Colon cancer Neg Hx     Social History   Socioeconomic History  . Marital status: Married    Spouse name: Not on file  . Number of children: Not on file  . Years of education: Not on file  . Highest education level: Not on file    Social Needs  . Financial resource strain: Not on file  . Food insecurity - worry: Not on file  . Food insecurity - inability: Not on file  . Transportation needs - medical: Not on file  . Transportation needs - non-medical: Not on file  Occupational History  . Not on file  Tobacco Use  . Smoking status: Never Smoker  . Smokeless tobacco: Never Used  Substance and Sexual Activity  . Alcohol use: No  . Drug use: Yes    Types: Hydrocodone  . Sexual activity: Yes  Other Topics Concern  . Not on file  Social History Narrative  . Not on file    Outpatient Medications Prior to Visit  Medication Sig Dispense Refill  . allopurinol (ZYLOPRIM) 100 MG tablet TAKE 2 TABLETS (200 MG TOTAL) BY MOUTH DAILY. (Patient taking differently: TAKE 1 TABLETS (200 MG TOTAL) BY MOUTH DAILY.) 60 tablet 5  . aspirin 81 MG tablet Take 1 tablet (81 mg total) by mouth daily. Resume 4 days post-op 30 tablet   . atorvastatin (LIPITOR) 10 MG tablet Take 1 tablet (10 mg total) by mouth daily. 90 tablet 0  . Fish Oil-Cholecalciferol (FISH OIL + D3) 1200-1000 MG-UNIT CAPS Take 1 capsule by mouth daily.    . fluticasone (FLONASE) 50  MCG/ACT nasal spray PLACE 2 SPRAYS INTO THE NOSE DAILY. 48 g 3  . lisinopril (PRINIVIL,ZESTRIL) 20 MG tablet TAKE 1 TABLET (20 MG TOTAL) BY MOUTH DAILY. 90 tablet 3  . omeprazole (PRILOSEC) 20 MG capsule TAKE 1 CAPSULE (20 MG TOTAL) BY MOUTH DAILY. 90 capsule 2  . OVER THE COUNTER MEDICATION 12 hr decongestant 120mg     . propranolol (INDERAL) 40 MG tablet TAKE 1 TABLET (40 MG TOTAL) BY MOUTH DAILY. 90 tablet 0  . rOPINIRole (REQUIP) 0.5 MG tablet Take 1-2 tablets (0.5-1 mg total) by mouth at bedtime. 60 tablet 3  . testosterone cypionate (DEPOTESTOSTERONE CYPIONATE) 200 MG/ML injection INJECT 1 ML INTO THE MUSCLE EVERY 14 DAYS 10 mL 0  . zolpidem (AMBIEN) 10 MG tablet Take 0.5-1 tablets (5-10 mg total) by mouth at bedtime as needed for sleep. 15 tablet 1  . promethazine-codeine  (PHENERGAN WITH CODEINE) 6.25-10 MG/5ML syrup Take 5 mLs by mouth every 6 (six) hours as needed for cough. (Patient not taking: Reported on 06/16/2017) 120 mL 0   No facility-administered medications prior to visit.     No Known Allergies  Review of Systems  Constitutional: Negative for fever and malaise/fatigue.  HENT: Negative for congestion.   Eyes: Negative for blurred vision.  Respiratory: Negative for shortness of breath.   Cardiovascular: Negative for chest pain, palpitations and leg swelling.  Gastrointestinal: Negative for abdominal pain, blood in stool and nausea.  Genitourinary: Negative for dysuria and frequency.  Musculoskeletal: Negative for falls.  Skin: Negative for rash.  Neurological: Negative for dizziness, loss of consciousness and headaches.  Endo/Heme/Allergies: Negative for environmental allergies.  Psychiatric/Behavioral: Negative for depression. The patient is not nervous/anxious.        Objective:    Physical Exam  Constitutional: He is oriented to person, place, and time. He appears well-developed and well-nourished. No distress.  HENT:  Head: Normocephalic and atraumatic.  Nose: Nose normal.  Eyes: Right eye exhibits no discharge. Left eye exhibits no discharge.  Neck: Normal range of motion. Neck supple.  Cardiovascular: Normal rate and regular rhythm.  No murmur heard. Pulmonary/Chest: Effort normal and breath sounds normal.  Abdominal: Soft. Bowel sounds are normal. There is no tenderness.  Musculoskeletal: He exhibits no edema.  Neurological: He is alert and oriented to person, place, and time.  Skin: Skin is warm and dry.  Psychiatric: He has a normal mood and affect.  Nursing note and vitals reviewed.   BP 122/64 (BP Location: Right Arm, Patient Position: Sitting, Cuff Size: Small)   Pulse 64   Temp (!) 97.5 F (36.4 C) (Oral)   Resp 16   Ht 6' 2.02" (1.88 m)   Wt 193 lb 3.2 oz (87.6 kg)   BMI 24.79 kg/m  Wt Readings from Last 3  Encounters:  06/16/17 193 lb 3.2 oz (87.6 kg)  12/12/16 207 lb 12.8 oz (94.3 kg)  09/09/16 211 lb (95.7 kg)   BP Readings from Last 3 Encounters:  06/16/17 122/64  12/12/16 128/80  09/09/16 106/60     Immunization History  Administered Date(s) Administered  . Influenza Whole 07/25/2009, 05/12/2012  . Influenza,inj,Quad PF,6+ Mos 08/18/2013, 06/16/2017  . Influenza-Unspecified 04/28/2014, 06/27/2016  . Td 03/06/2005, 05/15/2010    Health Maintenance  Topic Date Due  . Hepatitis C Screening  Jan 15, 1956  . HIV Screening  08/10/1969  . INFLUENZA VACCINE  02/26/2017  . TETANUS/TDAP  05/15/2020  . COLONOSCOPY  09/09/2026    Lab Results  Component Value Date  WBC 8.3 01/03/2017   HGB 17.5 (H) 01/03/2017   HCT 52.9 (H) 01/03/2017   PLT 234.0 01/03/2017   GLUCOSE 98 12/12/2016   CHOL 134 12/12/2016   TRIG 133.0 12/12/2016   HDL 44.60 12/12/2016   LDLDIRECT 125.7 02/10/2013   LDLCALC 63 12/12/2016   ALT 27 12/12/2016   AST 22 12/12/2016   NA 135 12/12/2016   K 4.2 12/12/2016   CL 103 12/12/2016   CREATININE 1.03 12/12/2016   BUN 14 12/12/2016   CO2 26 12/12/2016   TSH 0.42 12/12/2016   PSA 0.63 11/23/2015   HGBA1C 5.6 12/12/2016    Lab Results  Component Value Date   TSH 0.42 12/12/2016   Lab Results  Component Value Date   WBC 8.3 01/03/2017   HGB 17.5 (H) 01/03/2017   HCT 52.9 (H) 01/03/2017   MCV 84.6 01/03/2017   PLT 234.0 01/03/2017   Lab Results  Component Value Date   NA 135 12/12/2016   K 4.2 12/12/2016   CO2 26 12/12/2016   GLUCOSE 98 12/12/2016   BUN 14 12/12/2016   CREATININE 1.03 12/12/2016   BILITOT 1.1 12/12/2016   ALKPHOS 45 12/12/2016   AST 22 12/12/2016   ALT 27 12/12/2016   PROT 7.0 12/12/2016   ALBUMIN 4.5 12/12/2016   CALCIUM 9.5 12/12/2016   GFR 77.69 12/12/2016   Lab Results  Component Value Date   CHOL 134 12/12/2016   Lab Results  Component Value Date   HDL 44.60 12/12/2016   Lab Results  Component Value Date    LDLCALC 63 12/12/2016   Lab Results  Component Value Date   TRIG 133.0 12/12/2016   Lab Results  Component Value Date   CHOLHDL 3 12/12/2016   Lab Results  Component Value Date   HGBA1C 5.6 12/12/2016         Assessment & Plan:   Problem List Items Addressed This Visit    Gout    Uric acid checked today      Relevant Orders   Uric acid   Hypertension    Well controlled, no changes to meds. Encouraged heart healthy diet such as the DASH diet and exercise as tolerated.       Relevant Orders   CBC   Comprehensive metabolic panel   TSH   Testosterone deficiency    Tolerating treatment continue the same      Relevant Orders   Testosterone   Hyperlipidemia    Encouraged heart healthy diet, increase exercise, avoid trans fats, consider a krill oil cap daily      Relevant Orders   Lipid panel   Hyperglycemia    hgba1c acceptable, minimize simple carbs. Increase exercise as tolerated. Continue current meds      Relevant Orders   Hemoglobin A1c   Vitamin D deficiency    Check level       Other Visit Diagnoses    Needs flu shot    -  Primary   Relevant Orders   Flu Vaccine QUAD 6+ mos PF IM (Fluarix Quad PF) (Completed)      I have discontinued Leeroy Bock. Sar's promethazine-codeine. I am also having him maintain his FISH OIL + D3, aspirin, omeprazole, allopurinol, OVER THE COUNTER MEDICATION, zolpidem, rOPINIRole, fluticasone, lisinopril, atorvastatin, testosterone cypionate, and propranolol.  No orders of the defined types were placed in this encounter.   CMA served as Neurosurgeon during this visit. History, Physical and Plan performed by medical provider. Documentation and orders reviewed and  attested to.  Penni Homans, MD

## 2017-06-16 NOTE — Assessment & Plan Note (Signed)
Well controlled, no changes to meds. Encouraged heart healthy diet such as the DASH diet and exercise as tolerated.  °

## 2017-06-16 NOTE — Assessment & Plan Note (Signed)
Uric acid checked today

## 2017-06-16 NOTE — Assessment & Plan Note (Signed)
Check level 

## 2017-06-16 NOTE — Assessment & Plan Note (Signed)
hgba1c acceptable, minimize simple carbs. Increase exercise as tolerated. Continue current meds 

## 2017-06-16 NOTE — Assessment & Plan Note (Signed)
Tolerating treatment continue the same

## 2017-06-16 NOTE — Telephone Encounter (Signed)
PA approved 05/17/2017 through 06/16/2018.

## 2017-06-16 NOTE — Telephone Encounter (Signed)
PA initiated via Covermymeds; KEY: [id number]. Awaiting determination.

## 2017-06-17 MED FILL — ZOLPIDEM TARTRATE 10 MG TAB: 10 | 30 days supply | Qty: 30 | Fill #0

## 2017-07-09 MED FILL — levoFLOXacin 500 MG TABS: 500 | 5 days supply | Qty: 5 | Fill #0

## 2017-07-14 ENCOUNTER — Other Ambulatory Visit: Payer: Self-pay | Admitting: Family Medicine

## 2017-07-14 MED FILL — ATORVASTATIN 10 MG TABLET: 10 | 90 days supply | Qty: 90 | Fill #0

## 2017-07-23 MED FILL — ALLOPURINOL 100 MG TABS: 100 | 30 days supply | Qty: 60 | Fill #5

## 2017-08-19 ENCOUNTER — Other Ambulatory Visit: Payer: Self-pay | Admitting: Family Medicine

## 2017-08-20 MED FILL — OMEPRAZOLE 20 MG CAP: 20 | 90 days supply | Qty: 90 | Fill #0

## 2017-09-03 ENCOUNTER — Other Ambulatory Visit: Payer: Self-pay | Admitting: Family Medicine

## 2017-09-03 MED FILL — FLUTICASONE PROP 50 MCG SPR: 50 | 90 days supply | Qty: 48 | Fill #2

## 2017-09-03 MED FILL — TESTOSTERONE CYPIONATE 200: 200 | 56 days supply | Qty: 4 | Fill #0

## 2017-09-04 MED FILL — AMOXICILLIN 500 MG CAPSULE: 500 | 7 days supply | Qty: 24 | Fill #0

## 2017-09-04 MED FILL — PROPRANOLOL 40 MG TABLET: 40 | 90 days supply | Qty: 90 | Fill #0

## 2017-09-11 ENCOUNTER — Other Ambulatory Visit: Payer: Self-pay | Admitting: Family Medicine

## 2017-09-11 MED FILL — LISINOPRIL 20 MG TABLET: 20 | 90 days supply | Qty: 90 | Fill #2

## 2017-09-12 MED FILL — ALLOPURINOL 100 MG TABS: 100 | 30 days supply | Qty: 60 | Fill #0

## 2017-09-22 DIAGNOSIS — M545 Low back pain: Secondary | ICD-10-CM | POA: Diagnosis not present

## 2017-09-22 DIAGNOSIS — M5136 Other intervertebral disc degeneration, lumbar region: Secondary | ICD-10-CM | POA: Diagnosis not present

## 2017-09-22 MED FILL — predniSONE 5 MG TABS: 5 | 6 days supply | Qty: 21 | Fill #0

## 2017-09-22 MED FILL — METHOCARBAMOL 500 MG TABLET: 500 | 10 days supply | Qty: 40 | Fill #0

## 2017-09-22 MED FILL — HYDROCODON-APAP 5-325: 5-325 | 5 days supply | Qty: 20 | Fill #0

## 2017-09-23 MED FILL — ZOLPIDEM TARTRATE 10 MG TAB: 10 | 30 days supply | Qty: 30 | Fill #1

## 2017-10-28 ENCOUNTER — Other Ambulatory Visit: Payer: Self-pay | Admitting: Family Medicine

## 2017-10-28 MED FILL — ATORVASTATIN 10 MG TABLET: 10 | 90 days supply | Qty: 90 | Fill #0

## 2017-11-17 ENCOUNTER — Telehealth: Payer: Self-pay | Admitting: Family Medicine

## 2017-11-17 MED FILL — ALLOPURINOL 100 MG TABLET: 100 | 30 days supply | Qty: 60 | Fill #1

## 2017-11-19 NOTE — Telephone Encounter (Signed)
Requesting: testosterone cypionate  Contract: 08/25/14 UDS: 08/25/14  Low risk Last OV:  06/16/17 Next OV:  12/25/17 Last Refill: 05/23/17  #10 ML   Please advise in absents of Dr. Abner GreenspanBlyth

## 2017-11-20 MED FILL — TESTOSTERONE CYP 200 MG/ML: 200 | 56 days supply | Qty: 4 | Fill #0

## 2017-11-20 NOTE — Telephone Encounter (Signed)
Can you send electronically please?

## 2017-11-20 NOTE — Telephone Encounter (Signed)
Sent!

## 2017-11-20 NOTE — Telephone Encounter (Signed)
Ok RF x 3 months  

## 2017-12-09 MED FILL — ZOLPIDEM TARTRATE 10 MG TAB: 10 | 30 days supply | Qty: 30 | Fill #2

## 2017-12-10 ENCOUNTER — Other Ambulatory Visit: Payer: Self-pay | Admitting: Family Medicine

## 2017-12-10 MED FILL — PROPRANOLOL 40 MG TABLET: 40 | 90 days supply | Qty: 90 | Fill #0

## 2017-12-23 ENCOUNTER — Encounter: Payer: BC Managed Care – PPO | Admitting: Family Medicine

## 2017-12-25 ENCOUNTER — Encounter: Payer: Self-pay | Admitting: Family Medicine

## 2017-12-25 ENCOUNTER — Ambulatory Visit (INDEPENDENT_AMBULATORY_CARE_PROVIDER_SITE_OTHER): Payer: BC Managed Care – PPO | Admitting: Family Medicine

## 2017-12-25 VITALS — BP 110/76 | HR 66 | Temp 97.3°F | Resp 18 | Ht 74.0 in | Wt 197.4 lb

## 2017-12-25 DIAGNOSIS — R351 Nocturia: Secondary | ICD-10-CM | POA: Diagnosis not present

## 2017-12-25 DIAGNOSIS — Z79899 Other long term (current) drug therapy: Secondary | ICD-10-CM | POA: Diagnosis not present

## 2017-12-25 DIAGNOSIS — M1A9XX0 Chronic gout, unspecified, without tophus (tophi): Secondary | ICD-10-CM | POA: Diagnosis not present

## 2017-12-25 DIAGNOSIS — K219 Gastro-esophageal reflux disease without esophagitis: Secondary | ICD-10-CM

## 2017-12-25 DIAGNOSIS — I10 Essential (primary) hypertension: Secondary | ICD-10-CM

## 2017-12-25 DIAGNOSIS — E782 Mixed hyperlipidemia: Secondary | ICD-10-CM

## 2017-12-25 DIAGNOSIS — Z0000 Encounter for general adult medical examination without abnormal findings: Secondary | ICD-10-CM

## 2017-12-25 DIAGNOSIS — G4700 Insomnia, unspecified: Secondary | ICD-10-CM | POA: Diagnosis not present

## 2017-12-25 DIAGNOSIS — E559 Vitamin D deficiency, unspecified: Secondary | ICD-10-CM

## 2017-12-25 DIAGNOSIS — N529 Male erectile dysfunction, unspecified: Secondary | ICD-10-CM | POA: Diagnosis not present

## 2017-12-25 DIAGNOSIS — R739 Hyperglycemia, unspecified: Secondary | ICD-10-CM | POA: Diagnosis not present

## 2017-12-25 DIAGNOSIS — E349 Endocrine disorder, unspecified: Secondary | ICD-10-CM | POA: Diagnosis not present

## 2017-12-25 LAB — COMPREHENSIVE METABOLIC PANEL
ALBUMIN: 4.5 g/dL (ref 3.5–5.2)
ALT: 28 U/L (ref 0–53)
AST: 21 U/L (ref 0–37)
Alkaline Phosphatase: 43 U/L (ref 39–117)
BUN: 17 mg/dL (ref 6–23)
CHLORIDE: 101 meq/L (ref 96–112)
CO2: 29 meq/L (ref 19–32)
CREATININE: 1.1 mg/dL (ref 0.40–1.50)
Calcium: 9.8 mg/dL (ref 8.4–10.5)
GFR: 71.77 mL/min (ref >60.00)
GLUCOSE: 112 mg/dL — AB (ref 70–99)
Potassium: 4.9 mEq/L (ref 3.5–5.1)
SODIUM: 136 meq/L (ref 135–145)
Total Bilirubin: 1.4 mg/dL — ABNORMAL HIGH (ref 0.2–1.2)
Total Protein: 7 g/dL (ref 6.0–8.3)

## 2017-12-25 LAB — MICROALBUMIN / CREATININE URINE RATIO
CREATININE, U: 95.7 mg/dL
MICROALB/CREAT RATIO: 0.7 mg/g (ref 0.0–30.0)
Microalb, Ur: <0.7 mg/dL (ref 0.0–1.9)

## 2017-12-25 LAB — HEMOGLOBIN A1C: Hgb A1c MFr Bld: 5.5 % (ref 4.6–6.5)

## 2017-12-25 LAB — CBC
HEMATOCRIT: 53.3 % — AB (ref 39.0–52.0)
Hemoglobin: 17.9 g/dL — ABNORMAL HIGH (ref 13.0–17.0)
MCHC: 33.6 g/dL (ref 30.0–36.0)
MCV: 89.1 fl (ref 78.0–100.0)
Platelets: 231 10*3/uL (ref 150.0–400.0)
RBC: 5.98 Mil/uL — ABNORMAL HIGH (ref 4.22–5.81)
RDW: 14.9 % (ref 11.5–15.5)
WBC: 8.5 10*3/uL (ref 4.0–10.5)

## 2017-12-25 LAB — URINALYSIS
Bilirubin Urine: NEGATIVE
Hgb urine dipstick: NEGATIVE
Ketones, ur: NEGATIVE
LEUKOCYTES UA: NEGATIVE
Nitrite: NEGATIVE
PH: 6 (ref 5.0–8.0)
SPECIFIC GRAVITY, URINE: 1.01 (ref 1.000–1.030)
TOTAL PROTEIN, URINE-UPE24: NEGATIVE
Urine Glucose: NEGATIVE
Urobilinogen, UA: 0.2 (ref 0.0–1.0)

## 2017-12-25 LAB — LIPID PANEL
CHOL/HDL RATIO: 3
CHOLESTEROL: 138 mg/dL (ref 0–200)
HDL: 45.5 mg/dL (ref >39.00)
LDL CALC: 66 mg/dL (ref 0–99)
NonHDL: 92.73
Triglycerides: 135 mg/dL (ref 0.0–149.0)
VLDL: 27 mg/dL (ref 0.0–40.0)

## 2017-12-25 LAB — URIC ACID: URIC ACID, SERUM: 7.8 mg/dL (ref 4.0–7.8)

## 2017-12-25 LAB — VITAMIN D 25 HYDROXY (VIT D DEFICIENCY, FRACTURES): VITD: 34.45 ng/mL (ref 30.00–100.00)

## 2017-12-25 LAB — PSA: PSA: 0.64 ng/mL (ref 0.10–4.00)

## 2017-12-25 LAB — TSH: TSH: 0.54 u[IU]/mL (ref 0.35–4.50)

## 2017-12-25 LAB — TESTOSTERONE: TESTOSTERONE: 116.31 ng/dL — AB (ref 300.00–890.00)

## 2017-12-25 MED ORDER — SILDENAFIL CITRATE 20 MG PO TABS: 20.0000 mg | ORAL_TABLET | Freq: Every day | ORAL | 1 refills | Status: DC | PRN

## 2017-12-25 MED ORDER — ZOLPIDEM TARTRATE 10 MG PO TABS: 5.0000 mg | ORAL_TABLET | Freq: Every evening | ORAL | 2 refills | Status: DC | PRN

## 2017-12-25 NOTE — Assessment & Plan Note (Signed)
Check labs 

## 2017-12-25 NOTE — Patient Instructions (Addendum)
GoodRx or CoverMyMeds to check prices  [facility] or [facility]  Get [place] a copy of your advanced directives  Shingrix is the new shingles shot, 2 shots over 2-6 months at pharmacy or here, check insurance  Preventive Care 40-64 Years, Male Preventive care refers to lifestyle choices and visits with your health care provider that can promote health and wellness. What does preventive care include?  A yearly physical exam. This is also called an annual well check.  Dental exams once or twice a year.  Routine eye exams. Ask your health care provider how often you should have your eyes checked.  Personal lifestyle choices, including: ? Daily care of your teeth and gums. ? Regular physical activity. ? Eating a healthy diet. ? Avoiding tobacco and drug use. ? Limiting alcohol use. ? Practicing safe sex. ? Taking low-dose aspirin every day starting at age 48. What happens during an annual well check? The services and screenings done by your health care provider during your annual well check will depend on your age, overall health, lifestyle risk factors, and family history of disease. Counseling Your health care provider may ask you questions about your:  Alcohol use.  Tobacco use.  Drug use.  Emotional well-being.  Home and relationship well-being.  Sexual activity.  Eating habits.  Work and work Statistician.  Screening You may have the following tests or measurements:  Height, weight, and BMI.  Blood pressure.  Lipid and cholesterol levels. These may be checked every 5 years, or more frequently if you are over 36 years old.  Skin check.  Lung cancer screening. You may have this screening every year starting at age 1 if you have a 30-pack-year history of smoking and currently smoke or have quit within the past 15 years.  Fecal occult blood test (FOBT) of the stool. You may have this test every year starting at age 29.  Flexible sigmoidoscopy or colonoscopy. You may  have a sigmoidoscopy every 5 years or a colonoscopy every 10 years starting at age 63.  Prostate cancer screening. Recommendations will vary depending on your family history and other risks.  Hepatitis C blood test.  Hepatitis B blood test.  Sexually transmitted disease (STD) testing.  Diabetes screening. This is done by checking your blood sugar (glucose) after you have not eaten for a while (fasting). You may have this done every 1-3 years.  Discuss your test results, treatment options, and if necessary, the need for more tests with your health care provider. Vaccines Your health care provider may recommend certain vaccines, such as:  Influenza vaccine. This is recommended every year.  Tetanus, diphtheria, and acellular pertussis (Tdap, Td) vaccine. You may need a Td booster every 10 years.  Varicella vaccine. You may need this if you have not been vaccinated.  Zoster vaccine. You may need this after age 3.  Measles, mumps, and rubella (MMR) vaccine. You may need at least one dose of MMR if you were born in 1957 or later. You may also need a second dose.  Pneumococcal 13-valent conjugate (PCV13) vaccine. You may need this if you have certain conditions and have not been vaccinated.  Pneumococcal polysaccharide (PPSV23) vaccine. You may need one or two doses if you smoke cigarettes or if you have certain conditions.  Meningococcal vaccine. You may need this if you have certain conditions.  Hepatitis A vaccine. You may need this if you have certain conditions or if you travel or work in places where you may be exposed to  hepatitis A.  Hepatitis B vaccine. You may need this if you have certain conditions or if you travel or work in places where you may be exposed to hepatitis B.  Haemophilus influenzae type b (Hib) vaccine. You may need this if you have certain risk factors.  Talk to your health care provider about which screenings and vaccines you need and how often you need  them. This information is not intended to replace advice given to you by your health care provider. Make sure you discuss any questions you have with your health care provider. Document Released: 08/11/2015 Document Revised: 04/03/2016 Document Reviewed: 05/16/2015 Elsevier Interactive Patient Education  [employer].

## 2017-12-25 NOTE — Assessment & Plan Note (Signed)
No recent flare.  Check uric acid level.   

## 2017-12-25 NOTE — Assessment & Plan Note (Signed)
Encouraged heart healthy diet, increase exercise, avoid trans fats, consider a krill oil cap daily 

## 2017-12-25 NOTE — Assessment & Plan Note (Signed)
Will try course of Revatio 20 mg

## 2017-12-25 NOTE — Assessment & Plan Note (Signed)
Patient encouraged to maintain heart healthy diet, regular exercise, adequate sleep. Consider daily probiotics. Take medications as prescribed 

## 2017-12-25 NOTE — Assessment & Plan Note (Signed)
Well controlled, no changes to meds. Encouraged heart healthy diet such as the DASH diet and exercise as tolerated.  °

## 2017-12-25 NOTE — Assessment & Plan Note (Signed)
Check level today 

## 2017-12-25 NOTE — Assessment & Plan Note (Signed)
hgba1c acceptable, minimize simple carbs. Increase exercise as tolerated. Continue current meds 

## 2017-12-25 NOTE — Progress Notes (Signed)
Subjective:  I acted as a Neurosurgeon for Dr. Abner Greenspan. [place], [place]  Patient ID: Taylor Mcdonald, male    DOB: Dec 07, 1955, 62 y.o.   MRN: [id number]  Chief Complaint  Patient presents with  . Annual Exam    HPI  Patient is in today for an annual exam and follow up on Hypertension, hyperlipidemia, low testosterone, allergies and more. He feels well. No recent febrile illness or hospitalizations. Is doing well with activities of daily living. Is trying to maintain high activity level. Maintains a heart healthy diet. They stay mostly on a heart healthy diet. [facility].Denies CP/palp/SOB/HA/congestion/fevers/GI or GU c/o. Taking meds as prescribed. Is noting increased trouble with Erectile dysfunction worsening over the past year.   Patient Care Team: Bradd Canary, MD as PCP - General (Family Medicine)   Past Medical History:  Diagnosis Date  . Allergy   . Anxiety 03/04/2012  . Fatigue   . GERD (gastroesophageal reflux disease)   . Gout   . Hyperglycemia   . Hyperlipidemia   . Hypertension   . Insomnia   . Internal nasal lesion 07/04/2016  . Low testosterone   . Lower back pain 09/09/2012  . Migraine   . Pain of left heel 10/01/2011  . Preventative health care 07/24/2012  . RLS (restless legs syndrome)   . Sinusitis 03/04/2012  . Varicose veins of both lower extremities 12/12/2016  . Vasovagal near syncope 04/30/2013  . Vitamin D deficiency 12/23/2011    Past Surgical History:  Procedure Laterality Date  . LUMBAR LAMINECTOMY/DECOMPRESSION MICRODISCECTOMY N/A 05/13/2013   Procedure: LUMBAR LAMINECTOMY/DECOMPRESSION MICRODISCECTOMY 1 LEVEL L3-4;  Surgeon: Javier Docker, MD;  Location: [facility];  Service: Orthopedics;  Laterality: N/A;  . PANENDOSCOPY    . TONSILLECTOMY      Family History  Problem Relation Age of Onset  . Obesity Mother   . Hypertension Son   . Hypertension Maternal Grandmother   . Obesity Maternal Grandmother   . Cancer Maternal Grandmother 50        intestinal  . Dementia Maternal Grandfather   . Colon cancer Neg Hx     Social History   Socioeconomic History  . Marital status: Married    Spouse name: Not on file  . Number of children: Not on file  . Years of education: Not on file  . Highest education level: Not on file  Occupational History  . Not on file  Social Needs  . Financial resource strain: Not on file  . Food insecurity:    Worry: Not on file    Inability: Not on file  . Transportation needs:    Medical: Not on file    Non-medical: Not on file  Tobacco Use  . Smoking status: Never Smoker  . Smokeless tobacco: Never Used  Substance and Sexual Activity  . Alcohol use: No  . Drug use: Yes    Types: Hydrocodone  . Sexual activity: Yes  Lifestyle  . Physical activity:    Days per week: Not on file    Minutes per session: Not on file  . Stress: Not on file  Relationships  . Social connections:    Talks on phone: Not on file    Gets together: Not on file    Attends religious service: Not on file    Active member of club or organization: Not on file    Attends meetings of clubs or organizations: Not on file    Relationship status:  Not on file  . Intimate partner violence:    Fear of current or ex partner: Not on file    Emotionally abused: Not on file    Physically abused: Not on file    Forced sexual activity: Not on file  Other Topics Concern  . Not on file  Social History Narrative  . Not on file    Outpatient Medications Prior to Visit  Medication Sig Dispense Refill  . allopurinol (ZYLOPRIM) 100 MG tablet TAKE 2 TABLETS (200 MG TOTAL) BY MOUTH DAILY. 60 tablet 3  . aspirin 81 MG tablet Take 1 tablet (81 mg total) by mouth daily. Resume 4 days post-op 30 tablet   . atorvastatin (LIPITOR) 10 MG tablet TAKE 1 TABLET (10 MG TOTAL) BY MOUTH DAILY. 90 tablet 0  . Fish Oil-Cholecalciferol (FISH OIL + D3) 1200-1000 MG-UNIT CAPS Take 1 capsule by mouth daily.    . fluticasone (FLONASE) 50 MCG/ACT  nasal spray PLACE 2 SPRAYS INTO THE NOSE DAILY. 48 g 3  . lisinopril (PRINIVIL,ZESTRIL) 20 MG tablet TAKE 1 TABLET (20 MG TOTAL) BY MOUTH DAILY. 90 tablet 3  . omeprazole (PRILOSEC) 20 MG capsule TAKE 1 CAPSULE (20 MG TOTAL) BY MOUTH DAILY. 90 capsule 2  . OVER THE COUNTER MEDICATION 12 hr decongestant     . propranolol (INDERAL) 40 MG tablet TAKE 1 TABLET (40 MG TOTAL) BY MOUTH DAILY. 90 tablet 0  . testosterone cypionate (DEPOTESTOSTERONE CYPIONATE) 200 MG/ML injection INJECT 1 ML UNDER THE SKIN EVERY 14 DAYS 4 mL 2  . rOPINIRole (REQUIP) 0.5 MG tablet Take 1-2 tablets (0.5-1 mg total) by mouth at bedtime. 60 tablet 3  . zolpidem (AMBIEN) 10 MG tablet Take 0.5-1 tablets (5-10 mg total) at bedtime as needed by mouth for sleep. 30 tablet 2   No facility-administered medications prior to visit.     No Known Allergies  Review of Systems  Constitutional: Negative for fever and malaise/fatigue.  HENT: Negative for congestion.   Eyes: Negative for blurred vision.  Respiratory: Negative for shortness of breath.   Cardiovascular: Negative for chest pain, palpitations and leg swelling.  Gastrointestinal: Negative for abdominal pain, blood in stool and nausea.  Genitourinary: Negative for dysuria and frequency.  Musculoskeletal: Negative for falls.  Skin: Negative for rash.  Neurological: Negative for dizziness, loss of consciousness and headaches.  Endo/Heme/Allergies: Negative for environmental allergies.  Psychiatric/Behavioral: Negative for depression. The patient is not nervous/anxious.        Objective:    Physical Exam  Constitutional: He is oriented to person, place, and time. No distress.  HENT:  Head: Normocephalic and atraumatic.  Eyes: Conjunctivae are normal.  Neck: Neck supple. No thyromegaly present.  Cardiovascular: Normal rate, regular rhythm and normal heart sounds.  No murmur heard. Pulmonary/Chest: Effort normal and breath sounds normal. No respiratory  distress.  Abdominal: He exhibits no distension and no mass. There is no tenderness.  Musculoskeletal: He exhibits no edema.  Neurological: He is alert and oriented to person, place, and time.  Skin: Skin is warm.  Psychiatric: Judgment normal.    BP 110/76 (BP Location: Left Arm, Patient Position: Sitting, Cuff Size: Normal)   Pulse 66   Temp (!) 97.3 F (36.3 C) (Oral)   Resp 18   Ht  (1.88 m)   Wt 197 lb 6.4 oz (89.5 kg)   SpO2 98%   BMI 25.34 kg/m  Wt Readings from Last 3 Encounters:  12/25/17 197 lb 6.4 oz (89.5 kg)  06/16/17 193 lb 3.2 oz (87.6 kg)  12/12/16 207 lb 12.8 oz (94.3 kg)   BP Readings from Last 3 Encounters:  12/25/17 110/76  06/16/17 122/64  12/12/16 128/80     Immunization History  Administered Date(s) Administered  . Influenza Whole 07/25/2009, 05/12/2012  . Influenza,inj,Quad PF,6+ Mos 08/18/2013, 06/16/2017  . Influenza-Unspecified 04/28/2014, 06/27/2016  . Td 03/06/2005, 05/15/2010    Health Maintenance  Topic Date Due  . Hepatitis C Screening  16-May-1956  . HIV Screening  08/10/1969  . INFLUENZA VACCINE  02/26/2018  . TETANUS/TDAP  05/15/2020  . COLONOSCOPY  09/09/2026    Lab Results  Component Value Date   WBC 8.4 06/16/2017   HGB 17.8 (H) 06/16/2017   HCT 52.3 (H) 06/16/2017   PLT 244.0 06/16/2017   GLUCOSE 103 (H) 06/16/2017   CHOL 138 06/16/2017   TRIG 107.0 06/16/2017   HDL 50.30 06/16/2017   LDLDIRECT 125.7 02/10/2013   LDLCALC 67 06/16/2017   ALT 27 06/16/2017   AST 23 06/16/2017   NA 135 06/16/2017   K 4.8 06/16/2017   CL 99 06/16/2017   CREATININE 1.05 06/16/2017   BUN 18 06/16/2017   CO2 30 06/16/2017   TSH 0.51 06/16/2017   PSA 0.63 11/23/2015   HGBA1C 5.5 06/16/2017    Lab Results  Component Value Date   TSH 0.51 06/16/2017   Lab Results  Component Value Date   WBC 8.4 06/16/2017   HGB 17.8 (H) 06/16/2017   HCT 52.3 (H) 06/16/2017   MCV 88.4 06/16/2017   PLT 244.0 06/16/2017   Lab Results    Component Value Date   NA 135 06/16/2017   K 4.8 06/16/2017   CO2 30 06/16/2017   GLUCOSE 103 (H) 06/16/2017   BUN 18 06/16/2017   CREATININE 1.05 06/16/2017   BILITOT 1.3 (H) 06/16/2017   ALKPHOS 47 06/16/2017   AST 23 06/16/2017   ALT 27 06/16/2017   PROT 7.2 06/16/2017   ALBUMIN 4.5 06/16/2017   CALCIUM 10.0 06/16/2017   GFR 75.86 06/16/2017   Lab Results  Component Value Date   CHOL 138 06/16/2017   Lab Results  Component Value Date   HDL 50.30 06/16/2017   Lab Results  Component Value Date   LDLCALC 67 06/16/2017   Lab Results  Component Value Date   TRIG 107.0 06/16/2017   Lab Results  Component Value Date   CHOLHDL 3 06/16/2017   Lab Results  Component Value Date   HGBA1C 5.5 06/16/2017         Assessment & Plan:   Problem List Items Addressed This Visit    Gout    No recent flare. Check uric acid level      Hypertension    Well controlled, no changes to meds. Encouraged heart healthy diet such as the DASH diet and exercise as tolerated.       Testosterone deficiency    Check level      Hyperlipidemia    Encouraged heart healthy diet, increase exercise, avoid trans fats, consider a krill oil cap daily      GERD (gastroesophageal reflux disease)    Avoid offending foods, start probiotics. Do not eat large meals in late evening and consider raising head of bed.       Hyperglycemia    hgba1c acceptable, minimize simple carbs. Increase exercise as tolerated. Continue current meds      Vitamin D deficiency    Check level today      Preventative  health care    Patient encouraged to maintain heart healthy diet, regular exercise, adequate sleep. Consider daily probiotics. Take medications as prescribed      Nocturia    Check labs      Erectile dysfunction    Will try course of Revatio 20 mg         I have discontinued Dayan W. Class's rOPINIRole. I am also having him maintain his FISH OIL + D3, aspirin, OVER THE COUNTER  MEDICATION, fluticasone, lisinopril, zolpidem, omeprazole, allopurinol, atorvastatin, testosterone cypionate, and propranolol.  No orders of the defined types were placed in this encounter.   CMA served as Neurosurgeon during this visit. History, Physical and Plan performed by medical provider. Documentation and orders reviewed and attested to.  Danise Edge, MD

## 2017-12-25 NOTE — Assessment & Plan Note (Signed)
Check level 

## 2017-12-25 NOTE — Assessment & Plan Note (Signed)
Avoid offending foods, start probiotics. Do not eat large meals in late evening and consider raising head of bed.  

## 2017-12-26 LAB — PAIN MGMT, PROFILE 8 W/CONF, U
6 ACETYLMORPHINE: NEGATIVE ng/mL (ref <10)
ALCOHOL METABOLITES: NEGATIVE ng/mL (ref <500)
Amphetamines: NEGATIVE ng/mL (ref <500)
BUPRENORPHINE, URINE: NEGATIVE ng/mL (ref <5)
Benzodiazepines: NEGATIVE ng/mL (ref <100)
COCAINE METABOLITE: NEGATIVE ng/mL (ref <150)
Creatinine: 96.3 mg/dL
MARIJUANA METABOLITE: NEGATIVE ng/mL (ref <20)
MDMA: NEGATIVE ng/mL (ref <500)
OPIATES: NEGATIVE ng/mL (ref <100)
Oxidant: NEGATIVE ug/mL (ref <200)
Oxycodone: NEGATIVE ng/mL (ref <100)
PH: 6.78 (ref 4.5–9.0)

## 2017-12-29 MED FILL — LISINOPRIL 20 MG TABLET: 20 | 90 days supply | Qty: 90 | Fill #3

## 2018-01-22 MED FILL — ALLOPURINOL 100 MG TABLET: 100 | 30 days supply | Qty: 60 | Fill #2

## 2018-01-22 MED FILL — FLUTICASONE PROP 50 MCG SPR: 50 | 90 days supply | Qty: 48 | Fill #3

## 2018-01-30 ENCOUNTER — Other Ambulatory Visit: Payer: Self-pay | Admitting: Family Medicine

## 2018-01-30 MED FILL — ATORVASTATIN 10 MG TABLET: 10 | 90 days supply | Qty: 90 | Fill #0

## 2018-02-02 MED FILL — TESTOSTERONE CYP 200 MG/ML: 200 | 56 days supply | Qty: 4 | Fill #1

## 2018-02-27 DIAGNOSIS — N5201 Erectile dysfunction due to arterial insufficiency: Secondary | ICD-10-CM | POA: Diagnosis not present

## 2018-02-27 DIAGNOSIS — E291 Testicular hypofunction: Secondary | ICD-10-CM | POA: Diagnosis not present

## 2018-03-03 MED FILL — OMEPRAZOLE 20 MG CPDR: 20 | 90 days supply | Qty: 90 | Fill #1

## 2018-03-16 ENCOUNTER — Other Ambulatory Visit: Payer: Self-pay | Admitting: Family Medicine

## 2018-03-16 MED FILL — PROPRANOLOL 40 MG TABLET: 40 | 90 days supply | Qty: 90 | Fill #0

## 2018-03-23 MED FILL — ANASTROZOLE 1 MG TABLET: 1 | 30 days supply | Qty: 15 | Fill #0

## 2018-03-31 ENCOUNTER — Other Ambulatory Visit: Payer: Self-pay | Admitting: Family Medicine

## 2018-03-31 DIAGNOSIS — E291 Testicular hypofunction: Secondary | ICD-10-CM | POA: Diagnosis not present

## 2018-03-31 MED FILL — LISINOPRIL 20 MG TABLET: 20 | 90 days supply | Qty: 90 | Fill #0

## 2018-03-31 MED FILL — ALLOPURINOL 100 MG TABLET: 100 | 30 days supply | Qty: 60 | Fill #3

## 2018-04-06 MED FILL — ZOLPIDEM TARTRATE 10 MG TAB: 10 | 30 days supply | Qty: 30 | Fill #0

## 2018-04-22 MED FILL — ANASTROZOLE 1 MG TABLET: 1 | 30 days supply | Qty: 15 | Fill #1

## 2018-04-30 ENCOUNTER — Telehealth: Payer: Self-pay

## 2018-04-30 NOTE — Telephone Encounter (Signed)
PA initiated via Covermymeds; KEY: [id number]. PA approved. Effective 03/31/2018 to 04/30/2019.

## 2018-05-01 MED FILL — TESTOSTERONE CYP 200 MG/ML: 200 | 56 days supply | Qty: 4 | Fill #2

## 2018-05-11 MED FILL — METHOCARBAMOL 500 MG TABLET: 500 | 10 days supply | Qty: 40 | Fill #1

## 2018-05-11 MED FILL — ZOLPIDEM TARTRATE 10 MG TAB: 10 | 30 days supply | Qty: 30 | Fill #1

## 2018-05-11 MED FILL — ATORVASTATIN 10 MG TABLET: 10 | 90 days supply | Qty: 90 | Fill #1

## 2018-05-18 ENCOUNTER — Other Ambulatory Visit: Payer: Self-pay | Admitting: Family Medicine

## 2018-05-18 MED FILL — ANASTROZOLE 1 MG TABLET: 1 | 30 days supply | Qty: 15 | Fill #2

## 2018-05-18 MED FILL — FLUTICASONE PROP 50 MCG SPR: 50 | 90 days supply | Qty: 48 | Fill #0

## 2018-06-08 ENCOUNTER — Other Ambulatory Visit: Payer: Self-pay | Admitting: Family Medicine

## 2018-06-12 MED FILL — ALLOPURINOL 100 MG TABLET: 100 | 30 days supply | Qty: 60 | Fill #0

## 2018-06-15 DIAGNOSIS — D225 Melanocytic nevi of trunk: Secondary | ICD-10-CM | POA: Diagnosis not present

## 2018-06-15 DIAGNOSIS — Z85828 Personal history of other malignant neoplasm of skin: Secondary | ICD-10-CM | POA: Diagnosis not present

## 2018-06-15 DIAGNOSIS — D2262 Melanocytic nevi of left upper limb, including shoulder: Secondary | ICD-10-CM | POA: Diagnosis not present

## 2018-06-15 DIAGNOSIS — D2261 Melanocytic nevi of right upper limb, including shoulder: Secondary | ICD-10-CM | POA: Diagnosis not present

## 2018-06-15 DIAGNOSIS — L570 Actinic keratosis: Secondary | ICD-10-CM | POA: Diagnosis not present

## 2018-06-15 MED FILL — predniSONE 5 MG TABS: 5 | 6 days supply | Qty: 21 | Fill #1

## 2018-06-16 MED FILL — ANASTROZOLE 1 MG TABLET: 1 | 30 days supply | Qty: 15 | Fill #3

## 2018-06-19 MED FILL — ZOLPIDEM TARTRATE 10 MG TAB: 10 | 30 days supply | Qty: 30 | Fill #2

## 2018-06-22 ENCOUNTER — Other Ambulatory Visit: Payer: Self-pay | Admitting: Family Medicine

## 2018-06-22 MED FILL — PROPRANOLOL 40 MG TABLET: 40 | 90 days supply | Qty: 90 | Fill #0

## 2018-06-29 ENCOUNTER — Ambulatory Visit: Payer: BC Managed Care – PPO | Admitting: Family Medicine

## 2018-06-30 ENCOUNTER — Encounter: Payer: Self-pay | Admitting: Family Medicine

## 2018-06-30 ENCOUNTER — Ambulatory Visit (INDEPENDENT_AMBULATORY_CARE_PROVIDER_SITE_OTHER): Payer: BC Managed Care – PPO | Admitting: Family Medicine

## 2018-06-30 VITALS — BP 110/80 | HR 71 | Temp 97.7°F | Resp 18 | Wt 203.0 lb

## 2018-06-30 DIAGNOSIS — Z79899 Other long term (current) drug therapy: Secondary | ICD-10-CM

## 2018-06-30 DIAGNOSIS — I10 Essential (primary) hypertension: Secondary | ICD-10-CM | POA: Diagnosis not present

## 2018-06-30 DIAGNOSIS — R739 Hyperglycemia, unspecified: Secondary | ICD-10-CM | POA: Diagnosis not present

## 2018-06-30 DIAGNOSIS — E782 Mixed hyperlipidemia: Secondary | ICD-10-CM

## 2018-06-30 DIAGNOSIS — G4700 Insomnia, unspecified: Secondary | ICD-10-CM

## 2018-06-30 DIAGNOSIS — E559 Vitamin D deficiency, unspecified: Secondary | ICD-10-CM

## 2018-06-30 MED ORDER — SILDENAFIL CITRATE 20 MG PO TABS: 20.0000 mg | ORAL_TABLET | Freq: Every day | ORAL | 5 refills | Status: DC | PRN

## 2018-06-30 MED ORDER — LEVOFLOXACIN 500 MG PO TABS: 500.0000 mg | ORAL_TABLET | Freq: Every day | ORAL | 0 refills | Status: DC

## 2018-06-30 MED ORDER — ZOLPIDEM TARTRATE 10 MG PO TABS: 5.0000 mg | ORAL_TABLET | Freq: Every evening | ORAL | 5 refills | Status: DC | PRN

## 2018-06-30 MED FILL — levoFLOXacin 500 MG TABS: 500 | 10 days supply | Qty: 10 | Fill #0

## 2018-06-30 NOTE — Patient Instructions (Addendum)
shingrix is the new shingles shot confirm with insurance they will cover then can return for shot  Hypertension Hypertension is another name for high blood pressure. High blood pressure forces your heart to work harder to pump blood. This can cause problems over time. There are two numbers in a blood pressure reading. There is a top number (systolic) over a bottom number (diastolic). It is best to have a blood pressure below 120/80. Healthy choices can help lower your blood pressure. You may need medicine to help lower your blood pressure if:  Your blood pressure cannot be lowered with healthy choices.  Your blood pressure is higher than 130/80.  Follow these instructions at home: Eating and drinking  If directed, follow the DASH eating plan. This diet includes: ? Filling half of your plate at each meal with fruits and vegetables. ? Filling one quarter of your plate at each meal with whole grains. Whole grains include whole wheat pasta, brown rice, and whole grain bread. ? Eating or drinking low-fat dairy products, such as skim milk or low-fat yogurt. ? Filling one quarter of your plate at each meal with low-fat (lean) proteins. Low-fat proteins include fish, skinless chicken, eggs, beans, and tofu. ? Avoiding fatty meat, cured and processed meat, or chicken with skin. ? Avoiding premade or processed food.  Eat less than 1,500 mg of salt (sodium) a day.  Limit alcohol use to no more than 1 drink a day for nonpregnant women and 2 drinks a day for men. One drink equals 12 oz of beer, 5 oz of wine, or 1 oz of hard liquor. Lifestyle  Work with your doctor to stay at a healthy weight or to lose weight. Ask your doctor what the best weight is for you.  Get at least 30 minutes of exercise that causes your heart to beat faster (aerobic exercise) most days of the week. This may include walking, swimming, or biking.  Get at least 30 minutes of exercise that strengthens your muscles (resistance  exercise) at least 3 days a week. This may include lifting weights or pilates.  Do not use any products that contain nicotine or tobacco. This includes cigarettes and e-cigarettes. If you need help quitting, ask your doctor.  Check your blood pressure at home as told by your doctor.  Keep all follow-up visits as told by your doctor. This is important. Medicines  Take over-the-counter and prescription medicines only as told by your doctor. Follow directions carefully.  Do not skip doses of blood pressure medicine. The medicine does not work as well if you skip doses. Skipping doses also puts you at risk for problems.  Ask your doctor about side effects or reactions to medicines that you should watch for. Contact a doctor if:  You think you are having a reaction to the medicine you are taking.  You have headaches that keep coming back (recurring).  You feel dizzy.  You have swelling in your ankles.  You have trouble with your vision. Get help right away if:  You get a very bad headache.  You start to feel confused.  You feel weak or numb.  You feel faint.  You get very bad pain in your: ? Chest. ? Belly (abdomen).  You throw up (vomit) more than once.  You have trouble breathing. Summary  Hypertension is another name for high blood pressure.  Making healthy choices can help lower blood pressure. If your blood pressure cannot be controlled with healthy choices, you may need  to take medicine. This information is not intended to replace advice given to you by your health care provider. Make sure you discuss any questions you have with your health care provider. Document Released: 01/01/2008 Document Revised: 06/12/2016 Document Reviewed: 06/12/2016 Elsevier Interactive Patient Education  [employer].

## 2018-07-01 LAB — COMPREHENSIVE METABOLIC PANEL
ALT: 29 U/L (ref 0–53)
AST: 21 U/L (ref 0–37)
Albumin: 4.8 g/dL (ref 3.5–5.2)
Alkaline Phosphatase: 50 U/L (ref 39–117)
BUN: 20 mg/dL (ref 6–23)
CO2: 28 mEq/L (ref 19–32)
Calcium: 10.3 mg/dL (ref 8.4–10.5)
Chloride: 101 mEq/L (ref 96–112)
Creatinine, Ser: 1.16 mg/dL (ref 0.40–1.50)
GFR: 67.39 mL/min (ref >60.00)
Glucose, Bld: 94 mg/dL (ref 70–99)
Potassium: 4.5 mEq/L (ref 3.5–5.1)
Sodium: 138 mEq/L (ref 135–145)
Total Bilirubin: 1 mg/dL (ref 0.2–1.2)
Total Protein: 7.1 g/dL (ref 6.0–8.3)

## 2018-07-01 LAB — PAIN MGMT, PROFILE 8 W/CONF, U
6 Acetylmorphine: NEGATIVE ng/mL (ref <10)
Alcohol Metabolites: NEGATIVE ng/mL (ref <500)
Amphetamines: NEGATIVE ng/mL (ref <500)
BENZODIAZEPINES: NEGATIVE ng/mL (ref <100)
Buprenorphine, Urine: NEGATIVE ng/mL (ref <5)
COCAINE METABOLITE: NEGATIVE ng/mL (ref <150)
Creatinine: 98.5 mg/dL
MDMA: NEGATIVE ng/mL (ref <500)
Marijuana Metabolite: NEGATIVE ng/mL (ref <20)
Opiates: NEGATIVE ng/mL (ref <100)
Oxidant: NEGATIVE ug/mL (ref <200)
Oxycodone: NEGATIVE ng/mL (ref <100)
pH: 7.32 (ref 4.5–9.0)

## 2018-07-01 LAB — TSH: TSH: 0.44 u[IU]/mL (ref 0.35–4.50)

## 2018-07-01 LAB — VITAMIN D 25 HYDROXY (VIT D DEFICIENCY, FRACTURES): VITD: 38.73 ng/mL (ref 30.00–100.00)

## 2018-07-01 LAB — HEMOGLOBIN A1C: Hgb A1c MFr Bld: 5.3 % (ref 4.6–6.5)

## 2018-07-01 NOTE — Assessment & Plan Note (Signed)
Well controlled, no changes to meds. Encouraged heart healthy diet such as the DASH diet and exercise as tolerated. Filled out DMV form for him regarding HTN

## 2018-07-01 NOTE — Assessment & Plan Note (Signed)
Supplement and monitor 

## 2018-07-01 NOTE — Assessment & Plan Note (Signed)
Tolerating statin, encouraged heart healthy diet, avoid trans fats, minimize simple carbs and saturated fats. Increase exercise as tolerated 

## 2018-07-01 NOTE — Assessment & Plan Note (Signed)
hgba1c acceptable, minimize simple carbs. Increase exercise as tolerated.  

## 2018-07-01 NOTE — Progress Notes (Signed)
Subjective:    Patient ID: Taylor Mcdonald, male    DOB: Dec 06, 1955, 62 y.o.   MRN: [id number]  No chief complaint on file.   HPI Patient is in today for follow-up.  He has a form from the Department of [employer] Veh[employer] that has to be filled out regarding his hypertension.  He feels well.  He denies any recent febrile illness or hospitalization.  He continues to endorse some insomnia but reports Ambien works well. Denies CP/palp/SOB/HA/congestion/fevers/GI or GU c/o. Taking meds as prescribed  Past Medical History:  Diagnosis Date  . Allergy   . Anxiety 03/04/2012  . Fatigue   . GERD (gastroesophageal reflux disease)   . Gout   . Hyperglycemia   . Hyperlipidemia   . Hypertension   . Insomnia   . Internal nasal lesion 07/04/2016  . Low testosterone   . Lower back pain 09/09/2012  . Migraine   . Pain of left heel 10/01/2011  . Preventative health care 07/24/2012  . RLS (restless legs syndrome)   . Sinusitis 03/04/2012  . Varicose veins of both lower extremities 12/12/2016  . Vasovagal near syncope 04/30/2013  . Vitamin D deficiency 12/23/2011    Past Surgical History:  Procedure Laterality Date  . LUMBAR LAMINECTOMY/DECOMPRESSION MICRODISCECTOMY N/A 05/13/2013   Procedure: LUMBAR LAMINECTOMY/DECOMPRESSION MICRODISCECTOMY 1 LEVEL L3-4;  Surgeon: Javier Docker, MD;  Location: [facility];  Service: Orthopedics;  Laterality: N/A;  . PANENDOSCOPY    . TONSILLECTOMY      Family History  Problem Relation Age of Onset  . Obesity Mother   . Hypertension Son   . Hypertension Maternal Grandmother   . Obesity Maternal Grandmother   . Cancer Maternal Grandmother 50       intestinal  . Dementia Maternal Grandfather   . Colon cancer Neg Hx     Social History   Socioeconomic History  . Marital status: Married    Spouse name: Not on file  . Number of children: Not on file  . Years of education: Not on file  . Highest education level: Not on file  Occupational History  . Not on file    Social Needs  . Financial resource strain: Not on file  . Food insecurity:    Worry: Not on file    Inability: Not on file  . Transportation needs:    Medical: Not on file    Non-medical: Not on file  Tobacco Use  . Smoking status: Never Smoker  . Smokeless tobacco: Never Used  Substance and Sexual Activity  . Alcohol use: No  . Drug use: Yes    Types: Hydrocodone  . Sexual activity: Yes  Lifestyle  . Physical activity:    Days per week: Not on file    Minutes per session: Not on file  . Stress: Not on file  Relationships  . Social connections:    Talks on phone: Not on file    Gets together: Not on file    Attends religious service: Not on file    Active member of club or organization: Not on file    Attends meetings of clubs or organizations: Not on file    Relationship status: Not on file  . Intimate partner violence:    Fear of current or ex partner: Not on file    Emotionally abused: Not on file    Physically abused: Not on file    Forced sexual activity: Not on file  Other Topics Concern  . Not  on file  Social History Narrative  . Not on file    Outpatient Medications Prior to Visit  Medication Sig Dispense Refill  . allopurinol (ZYLOPRIM) 100 MG tablet TAKE 2 TABLETS (200 MG TOTAL) BY MOUTH DAILY. 60 tablet 3  . aspirin 81 MG tablet Take 1 tablet (81 mg total) by mouth daily. Resume 4 days post-op 30 tablet   . atorvastatin (LIPITOR) 10 MG tablet TAKE 1 TABLET (10 MG TOTAL) BY MOUTH DAILY. 90 tablet 1  . Fish Oil-Cholecalciferol (FISH OIL + D3) 1200-1000 MG-UNIT CAPS Take 1 capsule by mouth daily.    . fluticasone (FLONASE) 50 MCG/ACT nasal spray PLACE 2 SPRAYS INTO THE NOSE DAILY. 48 g 3  . lisinopril (PRINIVIL,ZESTRIL) 20 MG tablet TAKE 1 TABLET (20 MG TOTAL) BY MOUTH DAILY. 90 tablet 3  . omeprazole (PRILOSEC) 20 MG capsule TAKE 1 CAPSULE (20 MG TOTAL) BY MOUTH DAILY. 90 capsule 2  . OVER THE COUNTER MEDICATION 12 hr decongestant 120mg     . propranolol  (INDERAL) 40 MG tablet TAKE 1 TABLET (40 MG TOTAL) BY MOUTH DAILY. 90 tablet 0  . testosterone cypionate (DEPOTESTOSTERONE CYPIONATE) 200 MG/ML injection INJECT 1 ML UNDER THE SKIN EVERY 14 DAYS 4 mL 2  . sildenafil (REVATIO) 20 MG tablet Take 1-4 tablets (20-80 mg total) by mouth daily as needed. 35 tablet 1  . zolpidem (AMBIEN) 10 MG tablet Take 0.5-1 tablets (5-10 mg total) by mouth at bedtime as needed for sleep. 30 tablet 2   No facility-administered medications prior to visit.     No Known Allergies  Review of Systems  Constitutional: Negative for fever and malaise/fatigue.  HENT: Negative for congestion.   Eyes: Negative for blurred vision.  Respiratory: Negative for cough and shortness of breath.   Cardiovascular: Negative for chest pain, palpitations and leg swelling.  Gastrointestinal: Negative for abdominal pain, blood in stool, nausea and vomiting.  Genitourinary: Negative for dysuria and frequency.  Musculoskeletal: Negative for back pain and falls.  Skin: Negative for rash.  Neurological: Negative for dizziness, loss of consciousness and headaches.  Endo/Heme/Allergies: Negative for environmental allergies.  Psychiatric/Behavioral: Negative for depression. The patient has insomnia. The patient is not nervous/anxious.        Objective:    Physical Exam  Constitutional: He is oriented to person, place, and time. He appears well-developed and well-nourished. No distress.  HENT:  Head: Normocephalic and atraumatic.  Nose: Nose normal.  Eyes: Right eye exhibits no discharge. Left eye exhibits no discharge.  Neck: Normal range of motion. Neck supple.  Cardiovascular: Normal rate and regular rhythm.  No murmur heard. Pulmonary/Chest: Effort normal and breath sounds normal.  Abdominal: Soft. Bowel sounds are normal. There is no tenderness.  Musculoskeletal: He exhibits no edema.  Neurological: He is alert and oriented to person, place, and time.  Skin: Skin is warm and  dry.  Psychiatric: He has a normal mood and affect.  Nursing note and vitals reviewed.   BP 110/80 (BP Location: Left Arm, Patient Position: Sitting, Cuff Size: Normal)   Pulse 71   Temp 97.7 F (36.5 C) (Oral)   Resp 18   Wt 203 lb (92.1 kg)   SpO2 97%   BMI 26.06 kg/m  Wt Readings from Last 3 Encounters:  06/30/18 203 lb (92.1 kg)  12/25/17 197 lb 6.4 oz (89.5 kg)  06/16/17 193 lb 3.2 oz (87.6 kg)     Lab Results  Component Value Date   WBC 8.5 12/25/2017  HGB 17.9 (H) 12/25/2017   HCT 53.3 (H) 12/25/2017   PLT 231.0 12/25/2017   GLUCOSE 112 (H) 12/25/2017   CHOL 138 12/25/2017   TRIG 135.0 12/25/2017   HDL 45.50 12/25/2017   LDLDIRECT 125.7 02/10/2013   LDLCALC 66 12/25/2017   ALT 28 12/25/2017   AST 21 12/25/2017   NA 136 12/25/2017   K 4.9 12/25/2017   CL 101 12/25/2017   CREATININE 1.10 12/25/2017   BUN 17 12/25/2017   CO2 29 12/25/2017   TSH 0.54 12/25/2017   PSA 0.64 12/25/2017   HGBA1C 5.5 12/25/2017   MICROALBUR <0.7 12/25/2017    Lab Results  Component Value Date   TSH 0.54 12/25/2017   Lab Results  Component Value Date   WBC 8.5 12/25/2017   HGB 17.9 (H) 12/25/2017   HCT 53.3 (H) 12/25/2017   MCV 89.1 12/25/2017   PLT 231.0 12/25/2017   Lab Results  Component Value Date   NA 136 12/25/2017   K 4.9 12/25/2017   CO2 29 12/25/2017   GLUCOSE 112 (H) 12/25/2017   BUN 17 12/25/2017   CREATININE 1.10 12/25/2017   BILITOT 1.4 (H) 12/25/2017   ALKPHOS 43 12/25/2017   AST 21 12/25/2017   ALT 28 12/25/2017   PROT 7.0 12/25/2017   ALBUMIN 4.5 12/25/2017   CALCIUM 9.8 12/25/2017   GFR 71.77 12/25/2017   Lab Results  Component Value Date   CHOL 138 12/25/2017   Lab Results  Component Value Date   HDL 45.50 12/25/2017   Lab Results  Component Value Date   LDLCALC 66 12/25/2017   Lab Results  Component Value Date   TRIG 135.0 12/25/2017   Lab Results  Component Value Date   CHOLHDL 3 12/25/2017   Lab Results  Component  Value Date   HGBA1C 5.5 12/25/2017       Assessment & Plan:   Problem List Items Addressed This Visit    Hypertension    Well controlled, no changes to meds. Encouraged heart healthy diet such as the DASH diet and exercise as tolerated. Filled out DMV form for him regarding HTN      Relevant Medications   sildenafil (REVATIO) 20 MG tablet   Other Relevant Orders   Comprehensive metabolic panel   TSH   Insomnia    Encouraged good sleep hygiene such as dark, quiet room. No blue/green glowing lights such as computer screens in bedroom. No alcohol or stimulants in evening. Cut down on caffeine as able. Regular exercise is helpful but not just prior to bed time. Refill on Ambien      Relevant Medications   zolpidem (AMBIEN) 10 MG tablet   Hyperlipidemia    Tolerating statin, encouraged heart healthy diet, avoid trans fats, minimize simple carbs and saturated fats. Increase exercise as tolerated      Relevant Medications   sildenafil (REVATIO) 20 MG tablet   Hyperglycemia    hgba1c acceptable, minimize simple carbs. Increase exercise as tolerated.       Relevant Orders   Hemoglobin A1c   Vitamin D deficiency    Supplement and monitor      Relevant Orders   VITAMIN D 25 Hydroxy (Vit-D Deficiency, Fractures)    Other Visit Diagnoses    High risk medication use    -  Primary   Relevant Orders   Pain Mgmt, Profile 8 w/Conf, U      I am having [name] start on levofloxacin. I am also having him  maintain his FISH OIL + D3, aspirin, OVER THE COUNTER MEDICATION, omeprazole, testosterone cypionate, atorvastatin, lisinopril, fluticasone, allopurinol, propranolol, zolpidem, and sildenafil.  Meds ordered this encounter  Medications  . zolpidem (AMBIEN) 10 MG tablet    Sig: Take 0.5-1 tablets (5-10 mg total) by mouth at bedtime as needed for sleep.    Dispense:  30 tablet    Refill:  5  . sildenafil (REVATIO) 20 MG tablet    Sig: Take 1-4 tablets (20-80 mg total) by  mouth daily as needed.    Dispense:  35 tablet    Refill:  5  . levofloxacin (LEVAQUIN) 500 MG tablet    Sig: Take 1 tablet (500 mg total) by mouth daily.    Dispense:  10 tablet    Refill:  0     Danise EdgeStacey Yee Joss, MD

## 2018-07-01 NOTE — Assessment & Plan Note (Signed)
Encouraged good sleep hygiene such as dark, quiet room. No blue/green glowing lights such as computer screens in bedroom. No alcohol or stimulants in evening. Cut down on caffeine as able. Regular exercise is helpful but not just prior to bed time. Refill on Ambien

## 2018-07-02 DIAGNOSIS — N5201 Erectile dysfunction due to arterial insufficiency: Secondary | ICD-10-CM | POA: Diagnosis not present

## 2018-07-02 DIAGNOSIS — E291 Testicular hypofunction: Secondary | ICD-10-CM | POA: Diagnosis not present

## 2018-07-02 MED FILL — TESTOSTERONE CYP 200 MG/ML: 200 | 28 days supply | Qty: 2 | Fill #0

## 2018-07-07 MED FILL — LISINOPRIL 20 MG TABLET: 20 | 90 days supply | Qty: 90 | Fill #1

## 2018-07-13 MED FILL — ANASTROZOLE 1 MG TABLET: 1 | 30 days supply | Qty: 15 | Fill #4

## 2018-08-05 MED FILL — OMEPRAZOLE 20 MG CPDR: 20 | 90 days supply | Qty: 90 | Fill #2

## 2018-08-11 ENCOUNTER — Other Ambulatory Visit: Payer: Self-pay | Admitting: Family Medicine

## 2018-08-11 MED FILL — TESTOSTERONE CYP 200 MG/ML: 200 | 28 days supply | Qty: 2 | Fill #1

## 2018-08-11 MED FILL — ATORVASTATIN 10 MG TABLET: 10 | 90 days supply | Qty: 90 | Fill #0

## 2018-08-11 MED FILL — ANASTROZOLE 1 MG TABLET: 1 | 30 days supply | Qty: 15 | Fill #5

## 2018-08-18 MED FILL — ALLOPURINOL 100 MG TABLET: 100 | 30 days supply | Qty: 60 | Fill #1

## 2018-09-03 MED FILL — FLUTICASONE PROP 50 MCG SPR: 50 | 90 days supply | Qty: 48 | Fill #1

## 2018-09-16 MED FILL — ANASTROZOLE 1 MG TABLET: 1 | 30 days supply | Qty: 15 | Fill #0

## 2018-09-21 ENCOUNTER — Other Ambulatory Visit: Payer: Self-pay | Admitting: Family Medicine

## 2018-09-21 MED FILL — PROPRANOLOL 40 MG TABLET: 40 | 90 days supply | Qty: 90 | Fill #0

## 2018-09-23 MED FILL — TESTOSTERONE CYPIONATE 200: 200 | 28 days supply | Qty: 2 | Fill #2

## 2018-09-28 MED FILL — ZOLPIDEM TARTRATE 10 MG TAB: 10 | 30 days supply | Qty: 30 | Fill #0

## 2018-10-09 DIAGNOSIS — E291 Testicular hypofunction: Secondary | ICD-10-CM | POA: Diagnosis not present

## 2018-10-09 DIAGNOSIS — N5201 Erectile dysfunction due to arterial insufficiency: Secondary | ICD-10-CM | POA: Diagnosis not present

## 2018-10-12 MED FILL — LISINOPRIL 20 MG TABLET: 20 | 90 days supply | Qty: 90 | Fill #2

## 2018-10-12 MED FILL — ANASTROZOLE 1 MG TABLET: 1 | 30 days supply | Qty: 15 | Fill #1

## 2018-10-19 MED FILL — ALLOPURINOL 100 MG TABS: 100 | 30 days supply | Qty: 60 | Fill #2

## 2018-11-10 MED FILL — ANASTROZOLE 1 MG TABLET: 1 | 30 days supply | Qty: 15 | Fill #2

## 2018-11-10 MED FILL — ATORVASTATIN 10 MG TABLET: 10 | 90 days supply | Qty: 90 | Fill #1

## 2018-11-30 MED FILL — ZOLPIDEM TARTRATE 10 MG TAB: 10 | 30 days supply | Qty: 30 | Fill #1

## 2018-12-04 MED FILL — TESTOSTERONE CYPIONATE 200: 200 | 28 days supply | Qty: 2 | Fill #3

## 2018-12-14 MED FILL — ANASTROZOLE 1 MG TABLET: 1 | 30 days supply | Qty: 15 | Fill #3

## 2018-12-28 ENCOUNTER — Other Ambulatory Visit: Payer: Self-pay | Admitting: Family Medicine

## 2018-12-28 MED FILL — TESTOSTERONE CYP 200 MG/ML: 200 | 28 days supply | Qty: 2 | Fill #0

## 2018-12-28 MED FILL — ALLOPURINOL 100 MG TABS: 100 | 30 days supply | Qty: 60 | Fill #3

## 2018-12-28 MED FILL — PROPRANOLOL 40 MG TABLET: 40 | 90 days supply | Qty: 90 | Fill #0

## 2018-12-31 ENCOUNTER — Other Ambulatory Visit: Payer: Self-pay

## 2018-12-31 ENCOUNTER — Encounter: Payer: Self-pay | Admitting: Family Medicine

## 2018-12-31 ENCOUNTER — Ambulatory Visit (INDEPENDENT_AMBULATORY_CARE_PROVIDER_SITE_OTHER): Payer: BC Managed Care – PPO | Admitting: Family Medicine

## 2018-12-31 DIAGNOSIS — E559 Vitamin D deficiency, unspecified: Secondary | ICD-10-CM | POA: Diagnosis not present

## 2018-12-31 DIAGNOSIS — I10 Essential (primary) hypertension: Secondary | ICD-10-CM

## 2018-12-31 DIAGNOSIS — E782 Mixed hyperlipidemia: Secondary | ICD-10-CM | POA: Diagnosis not present

## 2018-12-31 DIAGNOSIS — R739 Hyperglycemia, unspecified: Secondary | ICD-10-CM

## 2019-01-03 NOTE — Assessment & Plan Note (Signed)
no changes to meds. Encouraged heart healthy diet such as the DASH diet and exercise as tolerated.  

## 2019-01-03 NOTE — Assessment & Plan Note (Signed)
Encouraged heart healthy diet, increase exercise, avoid trans fats, consider a krill oil cap daily 

## 2019-01-03 NOTE — Assessment & Plan Note (Signed)
hgba1c acceptable, minimize simple carbs. Increase exercise as tolerated.  

## 2019-01-03 NOTE — Assessment & Plan Note (Signed)
Supplement and monitor 

## 2019-01-03 NOTE — Progress Notes (Signed)
Virtual Visit via Video Note  I connected with Taylor Mcdonald on 12/31/2018 at 10:00 AM EDT by a video enabled telemedicine application and verified that I am speaking with the correct person using two identifiers.  Location: Patient: home Provider: office   I discussed the limitations of evaluation and management by telemedicine and the availability of in person appointments. The patient expressed understanding and agreed to proceed. Taylor Mcdonald was able to get the patient set up on a video visit.     Subjective:    Patient ID: Taylor Mcdonald, male    DOB: 07-Oct-1955, 63 y.o.   MRN: [id number]  No chief complaint on file.   HPI Patient is in today for follow up on hypertension, hyperlipidemia, reflux, gout and more. He feels mostly well. No recent febrile illness or hospitalizations. He has had some episodes of feeling off balance with bedning and rapid movements and he notes he also has not been drinking significant water. No falls or syncope. Notes some occasional reflux, had an episode roughtly every third day which then responds to prn Prilosec. Denies CP/palp/SOB/HA/congestion/fevers or GU c/o. Taking meds as prescribed  Past Medical History:  Diagnosis Date  . Allergy   . Anxiety 03/04/2012  . Fatigue   . GERD (gastroesophageal reflux disease)   . Gout   . Hyperglycemia   . Hyperlipidemia   . Hypertension   . Insomnia   . Internal nasal lesion 07/04/2016  . Low testosterone   . Lower back pain 09/09/2012  . Migraine   . Pain of left heel 10/01/2011  . Preventative health care 07/24/2012  . RLS (restless legs syndrome)   . Sinusitis 03/04/2012  . Varicose veins of both lower extremities 12/12/2016  . Vasovagal near syncope 04/30/2013  . Vitamin D deficiency 12/23/2011    Past Surgical History:  Procedure Laterality Date  . LUMBAR LAMINECTOMY/DECOMPRESSION MICRODISCECTOMY N/A 05/13/2013   Procedure: LUMBAR LAMINECTOMY/DECOMPRESSION MICRODISCECTOMY 1 LEVEL L3-4;   Surgeon: Johnn Hai, MD;  Location: [facility];  Service: Orthopedics;  Laterality: N/A;  . PANENDOSCOPY    . TONSILLECTOMY      Family History  Problem Relation Age of Onset  . Obesity Mother   . Hypertension Son   . Hypertension Maternal Grandmother   . Obesity Maternal Grandmother   . Cancer Maternal Grandmother 68       intestinal  . Dementia Maternal Grandfather   . Colon cancer Neg Hx     Social History   Socioeconomic History  . Marital status: Married    Spouse name: Not on file  . Number of children: Not on file  . Years of education: Not on file  . Highest education level: Not on file  Occupational History  . Not on file  Social Needs  . Financial resource strain: Not on file  . Food insecurity:    Worry: Not on file    Inability: Not on file  . Transportation needs:    Medical: Not on file    Non-medical: Not on file  Tobacco Use  . Smoking status: Never Smoker  . Smokeless tobacco: Never Used  Substance and Sexual Activity  . Alcohol use: No  . Drug use: Yes    Types: Hydrocodone  . Sexual activity: Yes  Lifestyle  . Physical activity:    Days per week: Not on file    Minutes per session: Not on file  . Stress: Not on file  Relationships  . Social connections:  Talks on phone: Not on file    Gets together: Not on file    Attends religious service: Not on file    Active member of club or organization: Not on file    Attends meetings of clubs or organizations: Not on file    Relationship status: Not on file  . Intimate partner violence:    Fear of current or ex partner: Not on file    Emotionally abused: Not on file    Physically abused: Not on file    Forced sexual activity: Not on file  Other Topics Concern  . Not on file  Social History Narrative  . Not on file    Outpatient Medications Prior to Visit  Medication Sig Dispense Refill  . allopurinol (ZYLOPRIM) 100 MG tablet TAKE 2 TABLETS (200 MG TOTAL) BY MOUTH DAILY. 60 tablet 3  .  aspirin 81 MG tablet Take 1 tablet (81 mg total) by mouth daily. Resume 4 days Mcdonald-op 30 tablet   . atorvastatin (LIPITOR) 10 MG tablet TAKE 1 TABLET (10 MG TOTAL) BY MOUTH DAILY. 90 tablet 1  . Fish Oil-Cholecalciferol (FISH OIL + D3) 1200-1000 MG-UNIT CAPS Take 1 capsule by mouth daily.    . fluticasone (FLONASE) 50 MCG/ACT nasal spray PLACE 2 SPRAYS INTO THE NOSE DAILY. 48 g 3  . lisinopril (PRINIVIL,ZESTRIL) 20 MG tablet TAKE 1 TABLET (20 MG TOTAL) BY MOUTH DAILY. 90 tablet 3  . omeprazole (PRILOSEC) 20 MG capsule TAKE 1 CAPSULE (20 MG TOTAL) BY MOUTH DAILY. 90 capsule 2  . OVER THE COUNTER MEDICATION 12 hr decongestant 120mg     . propranolol (INDERAL) 40 MG tablet TAKE 1 TABLET (40 MG TOTAL) BY MOUTH DAILY. 90 tablet 0  . sildenafil (REVATIO) 20 MG tablet Take 1-4 tablets (20-80 mg total) by mouth daily as needed. 35 tablet 5  . testosterone cypionate (DEPOTESTOSTERONE CYPIONATE) 200 MG/ML injection INJECT 1 ML UNDER THE SKIN EVERY 14 DAYS 4 mL 2  . zolpidem (AMBIEN) 10 MG tablet Take 0.5-1 tablets (5-10 mg total) by mouth at bedtime as needed for sleep. 30 tablet 5  . levofloxacin (LEVAQUIN) 500 MG tablet Take 1 tablet (500 mg total) by mouth daily. 10 tablet 0   No facility-administered medications prior to visit.     No Known Allergies  Review of Systems  Constitutional: Negative for fever and malaise/fatigue.  HENT: Negative for congestion.   Eyes: Negative for blurred vision.  Respiratory: Negative for shortness of breath.   Cardiovascular: Negative for chest pain, palpitations and leg swelling.  Gastrointestinal: Positive for heartburn. Negative for abdominal pain, blood in stool and nausea.  Genitourinary: Negative for dysuria and frequency.  Musculoskeletal: Negative for falls.  Skin: Negative for rash.  Neurological: Positive for dizziness. Negative for loss of consciousness and headaches.  Endo/Heme/Allergies: Negative for environmental allergies.   Psychiatric/Behavioral: Negative for depression. The patient is not nervous/anxious.        Objective:    Physical Exam Constitutional:      Appearance: Normal appearance.  HENT:     Head: Normocephalic and atraumatic.     Nose: Nose normal.  Eyes:     General:        Right eye: No discharge.        Left eye: No discharge.  Pulmonary:     Effort: Pulmonary effort is normal.  Neurological:     Mental Status: He is alert and oriented to person, place, and time.  Psychiatric:  Mood and Affect: Mood normal.        Behavior: Behavior normal.     BP 110/80  Wt Readings from Last 3 Encounters:  06/30/18 203 lb (92.1 kg)  12/25/17 197 lb 6.4 oz (89.5 kg)  06/16/17 193 lb 3.2 oz (87.6 kg)    Diabetic Foot Exam - Simple   No data filed     Lab Results  Component Value Date   WBC 8.5 12/25/2017   HGB 17.9 (H) 12/25/2017   HCT 53.3 (H) 12/25/2017   PLT 231.0 12/25/2017   GLUCOSE 94 06/30/2018   CHOL 138 12/25/2017   TRIG 135.0 12/25/2017   HDL 45.50 12/25/2017   LDLDIRECT 125.7 02/10/2013   LDLCALC 66 12/25/2017   ALT 29 06/30/2018   AST 21 06/30/2018   NA 138 06/30/2018   K 4.5 06/30/2018   CL 101 06/30/2018   CREATININE 1.16 06/30/2018   BUN 20 06/30/2018   CO2 28 06/30/2018   TSH 0.44 06/30/2018   PSA 0.64 12/25/2017   HGBA1C 5.3 06/30/2018   MICROALBUR <0.7 12/25/2017    Lab Results  Component Value Date   TSH 0.44 06/30/2018   Lab Results  Component Value Date   WBC 8.5 12/25/2017   HGB 17.9 (H) 12/25/2017   HCT 53.3 (H) 12/25/2017   MCV 89.1 12/25/2017   PLT 231.0 12/25/2017   Lab Results  Component Value Date   NA 138 06/30/2018   K 4.5 06/30/2018   CO2 28 06/30/2018   GLUCOSE 94 06/30/2018   BUN 20 06/30/2018   CREATININE 1.16 06/30/2018   BILITOT 1.0 06/30/2018   ALKPHOS 50 06/30/2018   AST 21 06/30/2018   ALT 29 06/30/2018   PROT 7.1 06/30/2018   ALBUMIN 4.8 06/30/2018   CALCIUM 10.3 06/30/2018   GFR 67.39 06/30/2018    Lab Results  Component Value Date   CHOL 138 12/25/2017   Lab Results  Component Value Date   HDL 45.50 12/25/2017   Lab Results  Component Value Date   LDLCALC 66 12/25/2017   Lab Results  Component Value Date   TRIG 135.0 12/25/2017   Lab Results  Component Value Date   CHOLHDL 3 12/25/2017   Lab Results  Component Value Date   HGBA1C 5.3 06/30/2018       Assessment & Plan:   Problem List Items Addressed This Visit    Hypertension    no changes to meds. Encouraged heart healthy diet such as the DASH diet and exercise as tolerated.       Hyperlipidemia    Encouraged heart healthy diet, increase exercise, avoid trans fats, consider a krill oil cap daily      Hyperglycemia    hgba1c acceptable, minimize simple carbs. Increase exercise as tolerated.       Vitamin D deficiency    Supplement and monitor         I have discontinued Larrie W. Thiemann's levofloxacin. I am also having him maintain his Fish Oil + D3, aspirin, OVER THE COUNTER MEDICATION, omeprazole, testosterone cypionate, lisinopril, fluticasone, allopurinol, zolpidem, sildenafil, atorvastatin, and propranolol.  No orders of the defined types were placed in this encounter.     I discussed the assessment and treatment plan with the patient. The patient was provided an opportunity to ask questions and all were answered. The patient agreed with the plan and demonstrated an understanding of the instructions.   The patient was advised to call back or seek an in-person evaluation if the  symptoms worsen or if the condition fails to improve as anticipated.  I provided 25 minutes of non-face-to-face time during this encounter.   Penni Homans, MD

## 2019-01-11 ENCOUNTER — Other Ambulatory Visit: Payer: Self-pay | Admitting: Family Medicine

## 2019-01-11 MED FILL — LISINOPRIL 20 MG TABLET: 20 | 90 days supply | Qty: 90 | Fill #3

## 2019-01-11 MED FILL — ANASTROZOLE 1 MG TABLET: 1 | 30 days supply | Qty: 15 | Fill #4

## 2019-01-11 MED FILL — OMEPRAZOLE 20 MG CPDR: 20 | 90 days supply | Qty: 90 | Fill #0

## 2019-01-26 ENCOUNTER — Other Ambulatory Visit: Payer: Self-pay | Admitting: Family Medicine

## 2019-01-26 DIAGNOSIS — G4700 Insomnia, unspecified: Secondary | ICD-10-CM

## 2019-01-26 MED FILL — FLUTICASONE PROP 50 MCG SPR: 50 | 90 days supply | Qty: 48 | Fill #2

## 2019-01-26 MED FILL — ZOLPIDEM TARTRATE 10 MG TAB: 10 | 30 days supply | Qty: 30 | Fill #0

## 2019-01-26 NOTE — Telephone Encounter (Signed)
Requesting:ambien Contract: yes UDS:n/a Last OV: 12/31/18 Next OV:04/06/19 Last Refill:06/30/18  [id number] Database:   Please advise

## 2019-02-05 MED FILL — TESTOSTERONE CYP 200 MG/ML: 200 | 28 days supply | Qty: 2 | Fill #1

## 2019-02-10 ENCOUNTER — Other Ambulatory Visit: Payer: Self-pay | Admitting: Family Medicine

## 2019-02-10 MED FILL — ATORVASTATIN 10 MG TABLET: 10 | 90 days supply | Qty: 90 | Fill #0

## 2019-02-15 MED FILL — ANASTROZOLE 1 MG TABLET: 1 | 30 days supply | Qty: 15 | Fill #5

## 2019-03-03 ENCOUNTER — Other Ambulatory Visit: Payer: Self-pay | Admitting: Family Medicine

## 2019-03-03 MED FILL — TESTOSTERONE CYP 200 MG/ML: 200 | 28 days supply | Qty: 2 | Fill #2

## 2019-03-03 MED FILL — ALLOPURINOL 100 MG TABS: 100 | 30 days supply | Qty: 60 | Fill #0

## 2019-03-18 MED FILL — ANASTROZOLE 1 MG TABLET: 1 | 30 days supply | Qty: 15 | Fill #0

## 2019-03-19 MED FILL — predniSONE 5 MG TABS: 5 | 6 days supply | Qty: 21 | Fill #0

## 2019-03-29 ENCOUNTER — Other Ambulatory Visit: Payer: Self-pay | Admitting: Family Medicine

## 2019-03-29 MED FILL — PROPRANOLOL HCL 40 MG TABS: 40 | 90 days supply | Qty: 90 | Fill #0

## 2019-03-29 MED FILL — ZOLPIDEM TARTRATE 10 MG TAB: 10 | 30 days supply | Qty: 30 | Fill #1

## 2019-03-31 MED FILL — TESTOSTERONE CYP 200 MG/ML: 200 | 28 days supply | Qty: 2 | Fill #3

## 2019-04-06 ENCOUNTER — Ambulatory Visit: Payer: BC Managed Care – PPO | Admitting: Family Medicine

## 2019-04-19 ENCOUNTER — Other Ambulatory Visit: Payer: Self-pay | Admitting: Family Medicine

## 2019-04-19 MED FILL — LISINOPRIL 20 MG TABS: 20 | 90 days supply | Qty: 90 | Fill #0

## 2019-04-19 MED FILL — ANASTROZOLE 1 MG TABLET: 1 | 30 days supply | Qty: 15 | Fill #1

## 2019-05-03 MED FILL — ALLOPURINOL 100 MG TABS: 100 | 30 days supply | Qty: 60 | Fill #1

## 2019-05-03 MED FILL — FLUTICASONE PROP 50 MCG SPR: 50 | 90 days supply | Qty: 48 | Fill #3

## 2019-05-06 MED FILL — TESTOSTERONE CYP 200 MG/ML: 200 | 28 days supply | Qty: 2 | Fill #0

## 2019-05-07 DIAGNOSIS — E291 Testicular hypofunction: Secondary | ICD-10-CM | POA: Diagnosis not present

## 2019-05-07 DIAGNOSIS — N5201 Erectile dysfunction due to arterial insufficiency: Secondary | ICD-10-CM | POA: Diagnosis not present

## 2019-05-10 MED FILL — OMEPRAZOLE 20 MG CAP: 20 | 90 days supply | Qty: 90 | Fill #1

## 2019-05-13 ENCOUNTER — Ambulatory Visit: Payer: BC Managed Care – PPO | Admitting: Family Medicine

## 2019-05-17 MED FILL — ATORVASTATIN 10 MG TABLET: 10 | 90 days supply | Qty: 90 | Fill #1

## 2019-05-17 MED FILL — ANASTROZOLE 1 MG TABLET: 1 | 30 days supply | Qty: 15 | Fill #2

## 2019-06-02 MED FILL — ZOLPIDEM TARTRATE 10 MG TAB: 10 | 30 days supply | Qty: 30 | Fill #2

## 2019-06-02 MED FILL — TESTOSTERONE CYPIONATE 200: 200 | 28 days supply | Qty: 2 | Fill #1

## 2019-06-18 DIAGNOSIS — Z85828 Personal history of other malignant neoplasm of skin: Secondary | ICD-10-CM | POA: Diagnosis not present

## 2019-06-18 DIAGNOSIS — D485 Neoplasm of uncertain behavior of skin: Secondary | ICD-10-CM | POA: Diagnosis not present

## 2019-06-18 DIAGNOSIS — L570 Actinic keratosis: Secondary | ICD-10-CM | POA: Diagnosis not present

## 2019-06-18 DIAGNOSIS — L814 Other melanin hyperpigmentation: Secondary | ICD-10-CM | POA: Diagnosis not present

## 2019-06-18 DIAGNOSIS — L853 Xerosis cutis: Secondary | ICD-10-CM | POA: Diagnosis not present

## 2019-06-18 DIAGNOSIS — L821 Other seborrheic keratosis: Secondary | ICD-10-CM | POA: Diagnosis not present

## 2019-06-21 ENCOUNTER — Other Ambulatory Visit: Payer: Self-pay

## 2019-06-21 MED FILL — ANASTROZOLE 1 MG TABLET: 1 | 30 days supply | Qty: 15 | Fill #3

## 2019-06-22 ENCOUNTER — Other Ambulatory Visit: Payer: Self-pay

## 2019-06-22 ENCOUNTER — Encounter: Payer: Self-pay | Admitting: Family Medicine

## 2019-06-22 ENCOUNTER — Ambulatory Visit (INDEPENDENT_AMBULATORY_CARE_PROVIDER_SITE_OTHER): Payer: BC Managed Care – PPO | Admitting: Family Medicine

## 2019-06-22 VITALS — BP 132/80 | HR 66 | Temp 98.7°F | Resp 18 | Wt 201.6 lb

## 2019-06-22 DIAGNOSIS — R739 Hyperglycemia, unspecified: Secondary | ICD-10-CM

## 2019-06-22 DIAGNOSIS — M1A9XX0 Chronic gout, unspecified, without tophus (tophi): Secondary | ICD-10-CM

## 2019-06-22 DIAGNOSIS — Z23 Encounter for immunization: Secondary | ICD-10-CM

## 2019-06-22 DIAGNOSIS — E782 Mixed hyperlipidemia: Secondary | ICD-10-CM | POA: Diagnosis not present

## 2019-06-22 DIAGNOSIS — K2100 Gastro-esophageal reflux disease with esophagitis, without bleeding: Secondary | ICD-10-CM

## 2019-06-22 DIAGNOSIS — E559 Vitamin D deficiency, unspecified: Secondary | ICD-10-CM

## 2019-06-22 DIAGNOSIS — R140 Abdominal distension (gaseous): Secondary | ICD-10-CM

## 2019-06-22 DIAGNOSIS — E349 Endocrine disorder, unspecified: Secondary | ICD-10-CM

## 2019-06-22 DIAGNOSIS — I10 Essential (primary) hypertension: Secondary | ICD-10-CM

## 2019-06-22 DIAGNOSIS — G4700 Insomnia, unspecified: Secondary | ICD-10-CM

## 2019-06-22 LAB — COMPREHENSIVE METABOLIC PANEL
ALT: 25 U/L (ref 0–53)
AST: 23 U/L (ref 0–37)
Albumin: 4.3 g/dL (ref 3.5–5.2)
Alkaline Phosphatase: 63 U/L (ref 39–117)
BUN: 15 mg/dL (ref 6–23)
CO2: 30 mEq/L (ref 19–32)
Calcium: 10 mg/dL (ref 8.4–10.5)
Chloride: 103 mEq/L (ref 96–112)
Creatinine, Ser: 1.02 mg/dL (ref 0.40–1.50)
GFR: 73.33 mL/min (ref >60.00)
Glucose, Bld: 106 mg/dL — ABNORMAL HIGH (ref 70–99)
Potassium: 4.4 mEq/L (ref 3.5–5.1)
Sodium: 139 mEq/L (ref 135–145)
Total Bilirubin: 1.4 mg/dL — ABNORMAL HIGH (ref 0.2–1.2)
Total Protein: 6.8 g/dL (ref 6.0–8.3)

## 2019-06-22 LAB — LIPID PANEL
Cholesterol: 140 mg/dL (ref 0–200)
HDL: 45.9 mg/dL (ref >39.00)
LDL Cholesterol: 74 mg/dL (ref 0–99)
NonHDL: 93.84
Total CHOL/HDL Ratio: 3
Triglycerides: 99 mg/dL (ref 0.0–149.0)
VLDL: 19.8 mg/dL (ref 0.0–40.0)

## 2019-06-22 LAB — CBC
HCT: 51.8 % (ref 39.0–52.0)
Hemoglobin: 17.1 g/dL — ABNORMAL HIGH (ref 13.0–17.0)
MCHC: 33 g/dL (ref 30.0–36.0)
MCV: 85.5 fl (ref 78.0–100.0)
Platelets: 247 10*3/uL (ref 150.0–400.0)
RBC: 6.06 Mil/uL — ABNORMAL HIGH (ref 4.22–5.81)
RDW: 15.3 % (ref 11.5–15.5)
WBC: 7.4 10*3/uL (ref 4.0–10.5)

## 2019-06-22 LAB — HEMOGLOBIN A1C: Hgb A1c MFr Bld: 5.4 % (ref 4.6–6.5)

## 2019-06-22 LAB — URIC ACID: Uric Acid, Serum: 7.3 mg/dL (ref 4.0–7.8)

## 2019-06-22 LAB — TSH: TSH: 0.4 u[IU]/mL (ref 0.35–4.50)

## 2019-06-22 LAB — VITAMIN D 25 HYDROXY (VIT D DEFICIENCY, FRACTURES): VITD: 46.8 ng/mL (ref 30.00–100.00)

## 2019-06-22 MED ORDER — CEFDINIR 300 MG PO CAPS: 300.0000 mg | ORAL_CAPSULE | Freq: Two times a day (BID) | ORAL | 0 refills | Status: AC

## 2019-06-22 MED FILL — CEFDINIR 300 MG CAPSULE: 300 | 10 days supply | Qty: 20 | Fill #0

## 2019-06-22 NOTE — Assessment & Plan Note (Signed)
no changes to meds. Encouraged heart healthy diet such as the DASH diet and exercise as tolerated.  

## 2019-06-22 NOTE — Progress Notes (Signed)
Subjective:    Patient ID: Selena LesserSpencer W Calderone, male    DOB: [birth year], 63 y.o.   MRN: [id number]  No chief complaint on file.   HPI Patient is in today for chronic medical concerns including hyperglycemia, hyperlipidemia, reflux, and hypertension. No recent febrile illness or hospitalizations. He is maintaining quarantine and wearing masks when out. He is noting some trouble with increased abdominal bloating after eating and some increased Dyspepsia. He has recently increased his Prilosec for QOD to QD and that has been somewhat better. Denies CP/palp/SOB/HA/congestion/fevers or GU c/o. Taking meds as prescribed  Past Medical History:  Diagnosis Date  . Allergy   . Anxiety 03/04/2012  . Fatigue   . GERD (gastroesophageal reflux disease)   . Gout   . Hyperglycemia   . Hyperlipidemia   . Hypertension   . Insomnia   . Internal nasal lesion 07/04/2016  . Low testosterone   . Lower back pain 09/09/2012  . Migraine   . Pain of left heel 10/01/2011  . Preventative health care 07/24/2012  . RLS (restless legs syndrome)   . Sinusitis 03/04/2012  . Varicose veins of both lower extremities 12/12/2016  . Vasovagal near syncope 04/30/2013  . Vitamin D deficiency 12/23/2011    Past Surgical History:  Procedure Laterality Date  . LUMBAR LAMINECTOMY/DECOMPRESSION MICRODISCECTOMY N/A 05/13/2013   Procedure: LUMBAR LAMINECTOMY/DECOMPRESSION MICRODISCECTOMY 1 LEVEL L3-4;  Surgeon: Javier DockerJeffrey C Beane, MD;  Location: [facility];  Service: Orthopedics;  Laterality: N/A;  . PANENDOSCOPY    . TONSILLECTOMY      Family History  Problem Relation Age of Onset  . Obesity Mother   . Hypertension Son   . Hypertension Maternal Grandmother   . Obesity Maternal Grandmother   . Cancer Maternal Grandmother 50       intestinal  . Dementia Maternal Grandfather   . Colon cancer Neg Hx     Social History   Socioeconomic History  . Marital status: Married    Spouse name: Not on file  . Number of children: Not on  file  . Years of education: Not on file  . Highest education level: Not on file  Occupational History  . Not on file  Social Needs  . Financial resource strain: Not on file  . Food insecurity    Worry: Not on file    Inability: Not on file  . Transportation needs    Medical: Not on file    Non-medical: Not on file  Tobacco Use  . Smoking status: Never Smoker  . Smokeless tobacco: Never Used  Substance and Sexual Activity  . Alcohol use: No  . Drug use: Yes    Types: Hydrocodone  . Sexual activity: Yes  Lifestyle  . Physical activity    Days per week: Not on file    Minutes per session: Not on file  . Stress: Not on file  Relationships  . Social Musicianconnections    Talks on phone: Not on file    Gets together: Not on file    Attends religious service: Not on file    Active member of club or organization: Not on file    Attends meetings of clubs or organizations: Not on file    Relationship status: Not on file  . Intimate partner violence    Fear of current or ex partner: Not on file    Emotionally abused: Not on file    Physically abused: Not on file    Forced sexual activity: Not on  file  Other Topics Concern  . Not on file  Social History Narrative  . Not on file    Outpatient Medications Prior to Visit  Medication Sig Dispense Refill  . allopurinol (ZYLOPRIM) 100 MG tablet TAKE 2 TABLETS (200 MG TOTAL) BY MOUTH DAILY. 60 tablet 3  . aspirin 81 MG tablet Take 1 tablet (81 mg total) by mouth daily. Resume 4 days post-op 30 tablet   . atorvastatin (LIPITOR) 10 MG tablet TAKE 1 TABLET (10 MG TOTAL) BY MOUTH DAILY. 90 tablet 1  . Fish Oil-Cholecalciferol (FISH OIL + D3) 1200-1000 MG-UNIT CAPS Take 1 capsule by mouth daily.    . fluticasone (FLONASE) 50 MCG/ACT nasal spray PLACE 2 SPRAYS INTO THE NOSE DAILY. 48 g 3  . lisinopril (ZESTRIL) 20 MG tablet TAKE 1 TABLET (20 MG TOTAL) BY MOUTH DAILY. 90 tablet 3  . omeprazole (PRILOSEC) 20 MG capsule TAKE 1 CAPSULE (20 MG TOTAL)  BY MOUTH DAILY. 90 capsule 2  . OVER THE COUNTER MEDICATION 12 hr decongestant     . propranolol (INDERAL) 40 MG tablet TAKE 1 TABLET (40 MG TOTAL) BY MOUTH DAILY. 90 tablet 0  . sildenafil (REVATIO) 20 MG tablet Take 1-4 tablets (20-80 mg total) by mouth daily as needed. 35 tablet 5  . testosterone cypionate (DEPOTESTOSTERONE CYPIONATE) 200 MG/ML injection INJECT 1 ML UNDER THE SKIN EVERY 14 DAYS 4 mL 2  . zolpidem (AMBIEN) 10 MG tablet TAKE 1/2-1 TABLET (5-10 MG TOTAL) BY MOUTH AT BEDTIME AS NEEDED FOR SLEEP. 30 tablet 5   No facility-administered medications prior to visit.     No Known Allergies  Review of Systems  Constitutional: Negative for fever and malaise/fatigue.  HENT: Negative for congestion.   Eyes: Negative for blurred vision.  Respiratory: Negative for shortness of breath.   Cardiovascular: Negative for chest pain, palpitations and leg swelling.  Gastrointestinal: Positive for abdominal pain and heartburn. Negative for blood in stool, constipation, diarrhea, melena, nausea and vomiting.  Genitourinary: Negative for dysuria and frequency.  Musculoskeletal: Positive for back pain. Negative for falls.  Skin: Negative for rash.  Neurological: Negative for dizziness, loss of consciousness and headaches.  Endo/Heme/Allergies: Negative for environmental allergies.  Psychiatric/Behavioral: Negative for depression. The patient is not nervous/anxious.        Objective:    Physical Exam Vitals signs and nursing note reviewed.  Constitutional:      General: He is not in acute distress.    Appearance: He is well-developed.  HENT:     Head: Normocephalic and atraumatic.     Nose: Nose normal.  Eyes:     General:        Right eye: No discharge.        Left eye: No discharge.  Neck:     Musculoskeletal: Normal range of motion and neck supple.  Cardiovascular:     Rate and Rhythm: Normal rate and regular rhythm.     Heart sounds: No murmur.  Pulmonary:     Effort:  Pulmonary effort is normal.     Breath sounds: Normal breath sounds.  Abdominal:     General: Bowel sounds are normal.     Palpations: Abdomen is soft.     Tenderness: There is no abdominal tenderness.  Skin:    General: Skin is warm and dry.  Neurological:     Mental Status: He is alert and oriented to person, place, and time.     BP 132/80   Pulse 66  Temp 98.7 F (37.1 C) (Temporal)   Resp 18   Wt 201 lb 9.6 oz (91.4 kg)   SpO2 99%   BMI 25.88 kg/m  Wt Readings from Last 3 Encounters:  06/22/19 201 lb 9.6 oz (91.4 kg)  06/30/18 203 lb (92.1 kg)  12/25/17 197 lb 6.4 oz (89.5 kg)    Diabetic Foot Exam - Simple   No data filed     Lab Results  Component Value Date   WBC 8.5 12/25/2017   HGB 17.9 (H) 12/25/2017   HCT 53.3 (H) 12/25/2017   PLT 231.0 12/25/2017   GLUCOSE 94 06/30/2018   CHOL 138 12/25/2017   TRIG 135.0 12/25/2017   HDL 45.50 12/25/2017   LDLDIRECT 125.7 02/10/2013   LDLCALC 66 12/25/2017   ALT 29 06/30/2018   AST 21 06/30/2018   NA 138 06/30/2018   K 4.5 06/30/2018   CL 101 06/30/2018   CREATININE 1.16 06/30/2018   BUN 20 06/30/2018   CO2 28 06/30/2018   TSH 0.44 06/30/2018   PSA 0.64 12/25/2017   HGBA1C 5.3 06/30/2018   MICROALBUR <0.7 12/25/2017    Lab Results  Component Value Date   TSH 0.44 06/30/2018   Lab Results  Component Value Date   WBC 8.5 12/25/2017   HGB 17.9 (H) 12/25/2017   HCT 53.3 (H) 12/25/2017   MCV 89.1 12/25/2017   PLT 231.0 12/25/2017   Lab Results  Component Value Date   NA 138 06/30/2018   K 4.5 06/30/2018   CO2 28 06/30/2018   GLUCOSE 94 06/30/2018   BUN 20 06/30/2018   CREATININE 1.16 06/30/2018   BILITOT 1.0 06/30/2018   ALKPHOS 50 06/30/2018   AST 21 06/30/2018   ALT 29 06/30/2018   PROT 7.1 06/30/2018   ALBUMIN 4.8 06/30/2018   CALCIUM 10.3 06/30/2018   GFR 67.39 06/30/2018   Lab Results  Component Value Date   CHOL 138 12/25/2017   Lab Results  Component Value Date   HDL 45.50  12/25/2017   Lab Results  Component Value Date   LDLCALC 66 12/25/2017   Lab Results  Component Value Date   TRIG 135.0 12/25/2017   Lab Results  Component Value Date   CHOLHDL 3 12/25/2017   Lab Results  Component Value Date   HGBA1C 5.3 06/30/2018       Assessment & Plan:   Problem List Items Addressed This Visit    Gout    Hydrate and monitor uric acid.      Relevant Orders   Uric acid   Hypertension    no changes to meds. Encouraged heart healthy diet such as the DASH diet and exercise as tolerated.       Relevant Orders   CBC   CMP   TSH   Testosterone deficiency    Is now following with Dr Thea Silversmith of [facility] and doing much better, will request records from last couple of visits to update chart      Insomnia    Encouraged good sleep hygiene such as dark, quiet room. No blue/green glowing lights such as computer screens in bedroom. No alcohol or stimulants in evening. Cut down on caffeine as able. Regular exercise is helpful but not just prior to bed time.       Hyperlipidemia    Encouraged heart healthy diet, increase exercise, avoid trans fats, consider a krill oil cap daily      Relevant Orders   Lipid panel   GERD (gastroesophageal  reflux disease)    Notes he has had to increase his Prilosec from qod to qd recently and he also notes some increased abdominal bloating after eating recently. Is referred back to Gastroenterology for further consideration      Relevant Orders   Ambulatory referral to Gastroenterology   Hyperglycemia    hgba1c acceptable, minimize simple carbs. Increase exercise as tolerated.       Relevant Orders   A1C   Vitamin D deficiency    Supplement and monitor      Relevant Orders   VITAMIN D    Other Visit Diagnoses    Needs flu shot    -  Primary   Relevant Orders   Flu Vaccine QUAD 6+ mos PF IM (Fluarix Quad PF) (Completed)   Abdominal bloating       Relevant Orders   Ambulatory referral to  Gastroenterology      I am having [name] start on cefdinir. I am also having him maintain his Fish Oil + D3, aspirin, OVER THE COUNTER MEDICATION, testosterone cypionate, fluticasone, sildenafil, omeprazole, zolpidem, atorvastatin, allopurinol, propranolol, and lisinopril.  Meds ordered this encounter  Medications  . cefdinir (OMNICEF) 300 MG capsule    Sig: Take 1 capsule (300 mg total) by mouth 2 (two) times daily for 10 days.    Dispense:  20 capsule    Refill:  0     Penni Homans, MD

## 2019-06-22 NOTE — Assessment & Plan Note (Signed)
Encouraged heart healthy diet, increase exercise, avoid trans fats, consider a krill oil cap daily 

## 2019-06-22 NOTE — Assessment & Plan Note (Signed)
Is now following with Dr Noah Delaine of [facility] and doing much better, will request records from last couple of visits to update chart

## 2019-06-22 NOTE — Assessment & Plan Note (Signed)
Hydrate and monitor uric acid 

## 2019-06-22 NOTE — Assessment & Plan Note (Signed)
Notes he has had to increase his Prilosec from qod to qd recently and he also notes some increased abdominal bloating after eating recently. Is referred back to Gastroenterology for further consideration

## 2019-06-22 NOTE — Patient Instructions (Addendum)
Pulse oximeter want oxygen in 90s  Multivitamin with minerals daily (selenium) Vitamin D 1000-2000 IU daily Aspirin EC 81 mg daily  Hypertension, Adult High blood pressure (hypertension) is when the force of blood pumping through the arteries is too strong. The arteries are the blood vessels that carry blood from the heart throughout the body. Hypertension forces the heart to work harder to pump blood and may cause arteries to become narrow or stiff. Untreated or uncontrolled hypertension can cause a heart attack, heart failure, a stroke, kidney disease, and other problems. A blood pressure reading consists of a higher number over a lower number. Ideally, your blood pressure should be below 120/80. The first ("top") number is called the systolic pressure. It is a measure of the pressure in your arteries as your heart beats. The second ("bottom") number is called the diastolic pressure. It is a measure of the pressure in your arteries as the heart relaxes. What are the causes? The exact cause of this condition is not known. There are some conditions that result in or are related to high blood pressure. What increases the risk? Some risk factors for high blood pressure are under your control. The following factors may make you more likely to develop this condition:  Smoking.  Having type 2 diabetes mellitus, high cholesterol, or both.  Not getting enough exercise or physical activity.  Being overweight.  Having too much fat, sugar, calories, or salt (sodium) in your diet.  Drinking too much alcohol. Some risk factors for high blood pressure may be difficult or impossible to change. Some of these factors include:  Having chronic kidney disease.  Having a family history of high blood pressure.  Age. Risk increases with age.  Race. You may be at higher risk if you are African American.  Gender. Men are at higher risk than women before age 35. After age 71, women are at higher risk than  men.  Having obstructive sleep apnea.  Stress. What are the signs or symptoms? High blood pressure may not cause symptoms. Very high blood pressure (hypertensive crisis) may cause:  Headache.  Anxiety.  Shortness of breath.  Nosebleed.  Nausea and vomiting.  Vision changes.  Severe chest pain.  Seizures. How is this diagnosed? This condition is diagnosed by measuring your blood pressure while you are seated, with your arm resting on a flat surface, your legs uncrossed, and your feet flat on the floor. The cuff of the blood pressure monitor will be placed directly against the skin of your upper arm at the level of your heart. It should be measured at least twice using the same arm. Certain conditions can cause a difference in blood pressure between your right and left arms. Certain factors can cause blood pressure readings to be lower or higher than normal for a short period of time:  When your blood pressure is higher when you are in a health care provider's office than when you are at home, this is called white coat hypertension. Most people with this condition do not need medicines.  When your blood pressure is higher at home than when you are in a health care provider's office, this is called masked hypertension. Most people with this condition may need medicines to control blood pressure. If you have a high blood pressure reading during one visit or you have normal blood pressure with other risk factors, you may be asked to:  Return on a different day to have your blood pressure checked again.  Monitor your blood pressure at home for 1 week or longer. If you are diagnosed with hypertension, you may have other blood or imaging tests to help your health care provider understand your overall risk for other conditions. How is this treated? This condition is treated by making healthy lifestyle changes, such as eating healthy foods, exercising more, and reducing your alcohol  intake. Your health care provider may prescribe medicine if lifestyle changes are not enough to get your blood pressure under control, and if:  Your systolic blood pressure is above 130.  Your diastolic blood pressure is above 80. Your personal target blood pressure may vary depending on your medical conditions, your age, and other factors. Follow these instructions at home: Eating and drinking   Eat a diet that is high in fiber and potassium, and low in sodium, added sugar, and fat. An example eating plan is called the DASH (Dietary Approaches to Stop Hypertension) diet. To eat this way: ? Eat plenty of fresh fruits and vegetables. Try to fill one half of your plate at each meal with fruits and vegetables. ? Eat whole grains, such as whole-wheat pasta, brown rice, or whole-grain bread. Fill about one fourth of your plate with whole grains. ? Eat or drink low-fat dairy products, such as skim milk or low-fat yogurt. ? Avoid fatty cuts of meat, processed or cured meats, and poultry with skin. Fill about one fourth of your plate with lean proteins, such as fish, chicken without skin, beans, eggs, or tofu. ? Avoid pre-made and processed foods. These tend to be higher in sodium, added sugar, and fat.  Reduce your daily sodium intake. Most people with hypertension should eat less than 1,500 mg of sodium a day.  Do not drink alcohol if: ? Your health care provider tells you not to drink. ? You are pregnant, may be pregnant, or are planning to become pregnant.  If you drink alcohol: ? Limit how much you use to:  0-1 drink a day for women.  0-2 drinks a day for men. ? Be aware of how much alcohol is in your drink. In the [place] one drink equals one 12 oz bottle of beer (355 mL), one 5 oz glass of wine (148 mL), or one 1 oz glass of hard liquor (44 mL). Lifestyle   Work with your health care provider to maintain a healthy body weight or to lose weight. Ask what an ideal weight is for you.   Get at least 30 minutes of exercise most days of the week. Activities may include walking, swimming, or biking.  Include exercise to strengthen your muscles (resistance exercise), such as Pilates or lifting weights, as part of your weekly exercise routine. Try to do these types of exercises for 30 minutes at least 3 days a week.  Do not use any products that contain nicotine or tobacco, such as cigarettes, e-cigarettes, and chewing tobacco. If you need help quitting, ask your health care provider.  Monitor your blood pressure at home as told by your health care provider.  Keep all follow-up visits as told by your health care provider. This is important. Medicines  Take over-the-counter and prescription medicines only as told by your health care provider. Follow directions carefully. Blood pressure medicines must be taken as prescribed.  Do not skip doses of blood pressure medicine. Doing this puts you at risk for problems and can make the medicine less effective.  Ask your health care provider about side effects or reactions to medicines  that you should watch for. Contact a health care provider if you:  Think you are having a reaction to a medicine you are taking.  Have headaches that keep coming back (recurring).  Feel dizzy.  Have swelling in your ankles.  Have trouble with your vision. Get help right away if you:  Develop a severe headache or confusion.  Have unusual weakness or numbness.  Feel faint.  Have severe pain in your chest or abdomen.  Vomit repeatedly.  Have trouble breathing. Summary  Hypertension is when the force of blood pumping through your arteries is too strong. If this condition is not controlled, it may put you at risk for serious complications.  Your personal target blood pressure may vary depending on your medical conditions, your age, and other factors. For most people, a normal blood pressure is less than 120/80.  Hypertension is treated with  lifestyle changes, medicines, or a combination of both. Lifestyle changes include losing weight, eating a healthy, low-sodium diet, exercising more, and limiting alcohol. This information is not intended to replace advice given to you by your health care provider. Make sure you discuss any questions you have with your health care provider. Document Released: 07/15/2005 Document Revised: 03/25/2018 Document Reviewed: 03/25/2018 Elsevier Patient Education  2020 [employer]

## 2019-06-22 NOTE — Assessment & Plan Note (Signed)
Supplement and monitor 

## 2019-06-22 NOTE — Assessment & Plan Note (Signed)
hgba1c acceptable, minimize simple carbs. Increase exercise as tolerated.  

## 2019-06-22 NOTE — Assessment & Plan Note (Signed)
Encouraged good sleep hygiene such as dark, quiet room. No blue/green glowing lights such as computer screens in bedroom. No alcohol or stimulants in evening. Cut down on caffeine as able. Regular exercise is helpful but not just prior to bed time.  

## 2019-06-23 ENCOUNTER — Other Ambulatory Visit: Payer: Self-pay | Admitting: *Deleted

## 2019-06-23 DIAGNOSIS — R17 Unspecified jaundice: Secondary | ICD-10-CM

## 2019-06-23 DIAGNOSIS — R109 Unspecified abdominal pain: Secondary | ICD-10-CM

## 2019-06-23 DIAGNOSIS — R140 Abdominal distension (gaseous): Secondary | ICD-10-CM

## 2019-06-28 ENCOUNTER — Other Ambulatory Visit: Payer: Self-pay | Admitting: Family Medicine

## 2019-06-29 ENCOUNTER — Other Ambulatory Visit: Payer: Self-pay

## 2019-06-29 ENCOUNTER — Ambulatory Visit (HOSPITAL_BASED_OUTPATIENT_CLINIC_OR_DEPARTMENT_OTHER)
Admission: RE | Admit: 2019-06-29 | Discharge: 2019-06-29 | Disposition: A | Discharge status: Home / Self Care | Payer: BC Managed Care – PPO | Source: Ambulatory Visit | Attending: Family Medicine | Admitting: Family Medicine

## 2019-06-29 ENCOUNTER — Other Ambulatory Visit: Payer: Self-pay | Admitting: Family Medicine

## 2019-06-29 DIAGNOSIS — R140 Abdominal distension (gaseous): Secondary | ICD-10-CM | POA: Insufficient documentation

## 2019-06-29 DIAGNOSIS — N281 Cyst of kidney, acquired: Secondary | ICD-10-CM | POA: Diagnosis not present

## 2019-06-29 DIAGNOSIS — R17 Unspecified jaundice: Secondary | ICD-10-CM | POA: Insufficient documentation

## 2019-06-29 DIAGNOSIS — R109 Unspecified abdominal pain: Secondary | ICD-10-CM | POA: Diagnosis not present

## 2019-06-29 DIAGNOSIS — K760 Fatty (change of) liver, not elsewhere classified: Secondary | ICD-10-CM | POA: Diagnosis not present

## 2019-06-29 MED FILL — PROPRANOLOL HCL 40 MG TABS: 40 | 90 days supply | Qty: 90 | Fill #0

## 2019-07-01 MED FILL — TESTOSTERONE CYPIONATE 200: 200 | 28 days supply | Qty: 2 | Fill #0

## 2019-07-02 ENCOUNTER — Encounter: Payer: Self-pay | Admitting: Family Medicine

## 2019-07-13 MED FILL — ALLOPURINOL 100 MG TABS: 100 | 30 days supply | Qty: 60 | Fill #2

## 2019-07-13 MED FILL — ZOLPIDEM TARTRATE 10 MG TAB: 10 | 30 days supply | Qty: 30 | Fill #3

## 2019-07-20 MED FILL — ANASTROZOLE 1 MG TABLET: 1 | 30 days supply | Qty: 15 | Fill #4

## 2019-07-20 MED FILL — LISINOPRIL 20 MG TABS: 20 | 90 days supply | Qty: 90 | Fill #1

## 2019-07-27 MED FILL — TESTOSTERONE CYP 200 MG/ML: 200 | 28 days supply | Qty: 2 | Fill #1

## 2019-08-03 ENCOUNTER — Ambulatory Visit (INDEPENDENT_AMBULATORY_CARE_PROVIDER_SITE_OTHER): Payer: Medicare PPO | Admitting: Gastroenterology

## 2019-08-03 ENCOUNTER — Encounter: Payer: Self-pay | Admitting: Gastroenterology

## 2019-08-03 VITALS — BP 120/86 | HR 72 | Temp 97.9°F | Ht 74.0 in | Wt 203.6 lb

## 2019-08-03 DIAGNOSIS — K219 Gastro-esophageal reflux disease without esophagitis: Secondary | ICD-10-CM | POA: Diagnosis not present

## 2019-08-03 DIAGNOSIS — Z1211 Encounter for screening for malignant neoplasm of colon: Secondary | ICD-10-CM

## 2019-08-03 DIAGNOSIS — Z1212 Encounter for screening for malignant neoplasm of rectum: Secondary | ICD-10-CM | POA: Diagnosis not present

## 2019-08-03 NOTE — Progress Notes (Signed)
    History of Present Illness: This is a 64 year old male with chronic GERD.  He states his symptoms are generally well controlled on omeprazole taken every other day or daily.  Over the holidays he had noted intermittent epigastric discomfort and bloating.  He thought symptoms could be due to overeating so he modified his diet and used omeprazole on a daily basis for couple weeks and his symptoms have resolved.  He underwent EGD and 2006 showing an esophageal stricture, which was dilated, and nonerosive esophagitis. Denies weight loss, constipation, diarrhea, change in stool caliber, melena, hematochezia, nausea, vomiting, dysphagia, chest pain.  Colonoscopy in 08/2016 showed diverticulosis and internal hemorrhoids   Current Medications, Allergies, Past Medical History, Past Surgical History, Family History and Social History were reviewed in [employer] record.   Physical Exam: General: Well developed, well nourished, no acute distress Head: Normocephalic and atraumatic Eyes:  sclerae anicteric, EOMI Ears: Normal auditory acuity Mouth: No deformity or lesions Lungs: Clear throughout to auscultation Heart: Regular rate and rhythm; no murmurs, rubs or bruits Abdomen: Soft, non tender and non distended. No masses, hepatosplenomegaly or hernias noted. Normal Bowel sounds Rectal: Not done Musculoskeletal: Symmetrical with no gross deformities  Pulses:  Normal pulses noted Extremities: No clubbing, cyanosis, edema or deformities noted Neurological: Alert oriented x 4, grossly nonfocal Psychological:  Alert and cooperative. Normal mood and affect   Assessment and Recommendations:  1.  Chronic GERD.  Recent epigastric discomfort and bloating.  R/O esophagitis, Barrett's, gastritis, ulcer. Continue omeprazole 20 mg po qd or qod to achieve full symptom control. Follow antireflux measures long term. Schedule EGD. The risks (including bleeding, perforation, infection, missed  lesions, medication reactions and possible hospitalization or surgery if complications occur), benefits, and alternatives to endoscopy with possible biopsy and possible dilation were discussed with the patient and they consent to proceed.    2.  CRC screening, average risk.  A 10-year interval colonoscopy is recommended in February 2028.

## 2019-08-03 NOTE — Patient Instructions (Signed)
You have been scheduled for an endoscopy. Please follow written instructions given to you at your visit today. If you use inhalers (even only as needed), please bring them with you on the day of your procedure. Your physician has requested that you go to www.startemmi.com and enter the access code given to you at your visit today. This web site gives a general overview about your procedure. However, you should still follow specific instructions given to you by our office regarding your preparation for the procedure.  Thank you for choosing me and [facility].  Venita Lick. Pleas Koch., MD., [facility]   Due to recent changes in healthcare laws, you may see the results of your imaging and laboratory studies on MyChart before your provider has had a chance to review them.  We understand that in some cases there may be results that are confusing or concerning to you. Not all laboratory results come back in the same time frame and the provider may be waiting for multiple results in order to interpret others.  Please give [place] 48 hours in order for your provider to thoroughly review all the results before contacting the office for clarification of your results.

## 2019-08-04 ENCOUNTER — Ambulatory Visit (INDEPENDENT_AMBULATORY_CARE_PROVIDER_SITE_OTHER): Payer: Medicare PPO

## 2019-08-04 DIAGNOSIS — Z1159 Encounter for screening for other viral diseases: Secondary | ICD-10-CM

## 2019-08-05 LAB — SARS CORONAVIRUS 2 (TAT 6-24 HRS): SARS Coronavirus 2: NEGATIVE

## 2019-08-06 ENCOUNTER — Ambulatory Visit (AMBULATORY_SURGERY_CENTER): Payer: Medicare PPO | Admitting: Gastroenterology

## 2019-08-06 ENCOUNTER — Other Ambulatory Visit: Payer: Self-pay

## 2019-08-06 ENCOUNTER — Encounter: Payer: Self-pay | Admitting: Gastroenterology

## 2019-08-06 VITALS — BP 106/71 | HR 70 | Temp 97.7°F | Resp 14 | Ht 74.0 in | Wt 203.0 lb

## 2019-08-06 DIAGNOSIS — K219 Gastro-esophageal reflux disease without esophagitis: Secondary | ICD-10-CM | POA: Diagnosis not present

## 2019-08-06 DIAGNOSIS — R1013 Epigastric pain: Secondary | ICD-10-CM

## 2019-08-06 DIAGNOSIS — K449 Diaphragmatic hernia without obstruction or gangrene: Secondary | ICD-10-CM

## 2019-08-06 DIAGNOSIS — K2270 Barrett's esophagus without dysplasia: Secondary | ICD-10-CM

## 2019-08-06 MED ORDER — SODIUM CHLORIDE 0.9 % IV SOLN: 500.0000 mL | Freq: Once | INTRAVENOUS | Status: DC

## 2019-08-06 NOTE — Progress Notes (Signed)
Temp  VS  JB  Pt's states no medical or surgical changes since previsit or office visit. Admitting RN reviewed

## 2019-08-06 NOTE — Progress Notes (Signed)
Called to room to assist during endoscopic procedure.  Patient ID and intended procedure confirmed with present staff. Received instructions for my participation in the procedure from the performing physician.  

## 2019-08-06 NOTE — Progress Notes (Signed)
Report given to PACU, vss 

## 2019-08-06 NOTE — Op Note (Signed)
[facility] Patient Name: Taylor Mcdonald Procedure Date: 08/06/2019 2:10 PM MRN: [id number] Endoscopist: Meryl Dare , MD Age: 64 Referring MD:  Date of Birth: 17-Jun-1956 Gender: Male Account #: [id number] Procedure:                Upper GI endoscopy Indications:              Epigastric abdominal pain, Gastroesophageal reflux                            disease Medicines:                Monitored Anesthesia Care Procedure:                Pre-Anesthesia Assessment:                           - Prior to the procedure, a History and Physical                            was performed, and patient medications and                            allergies were reviewed. The patient's tolerance of                            previous anesthesia was also reviewed. The risks                            and benefits of the procedure and the sedation                            options and risks were discussed with the patient.                            All questions were answered, and informed consent                            was obtained. Prior Anticoagulants: The patient has                            taken no previous anticoagulant or antiplatelet                            agents. ASA Grade Assessment: II - A patient with                            mild systemic disease. After reviewing the risks                            and benefits, the patient was deemed in                            satisfactory condition to undergo the procedure.  After obtaining informed consent, the endoscope was                            passed under direct vision. Throughout the                            procedure, the patient's blood pressure, pulse, and                            oxygen saturations were monitored continuously. The                            Endoscope was introduced through the mouth, and                            advanced to the second part of duodenum. The  upper                            GI endoscopy was accomplished without difficulty.                            The patient tolerated the procedure well. Scope In: Scope Out: Findings:                 The Z-line was variable and was found 37 - 39 cm                            from the incisors. Biopsies were taken with a cold                            forceps for histology.                           The exam of the esophagus was otherwise normal.                           A small hiatal hernia was present.                           The exam of the stomach was otherwise normal.                           The duodenal bulb and second portion of the                            duodenum were normal. Complications:            No immediate complications. Estimated Blood Loss:     Estimated blood loss was minimal. Impression:               - Z-line variable, 37 - 39 cm from the incisors.                            Biopsied.                           -  Small hiatal hernia.                           - Normal duodenal bulb and second portion of the                            duodenum. Recommendation:           - Patient has a contact number available for                            emergencies. The signs and symptoms of potential                            delayed complications were discussed with the                            patient. Return to normal activities tomorrow.                            Written discharge instructions were provided to the                            patient.                           - Resume previous diet.                           - Follow antireflux measures.                           - Continue present medications.                           - Await pathology results. Meryl Dare, MD 08/06/2019 2:24:27 PM This report has been signed electronically.

## 2019-08-06 NOTE — Patient Instructions (Addendum)
Thank you for allowing [place] to care for you today!  Await pathology results by mail, approximately 2 weeks.  Resume previous diet and medications today.  Return to your normal activities tomorrow.  Practice anti-reflux  measures.  Handout provided.    YOU HAD AN ENDOSCOPIC PROCEDURE TODAY AT THE [facility]:   Refer to the procedure report that was given to you for any specific questions about what was found during the examination.  If the procedure report does not answer your questions, please call your gastroenterologist to clarify.  If you requested that your care partner not be given the details of your procedure findings, then the procedure report has been included in a sealed envelope for you to review at your convenience later.  YOU SHOULD EXPECT: Some feelings of bloating in the abdomen. Passage of more gas than usual.  Walking can help get rid of the air that was put into your GI tract during the procedure and reduce the bloating. If you had a lower endoscopy (such as a colonoscopy or flexible sigmoidoscopy) you may notice spotting of blood in your stool or on the toilet paper. If you underwent a bowel prep for your procedure, you may not have a normal bowel movement for a few days.  Please Note:  You might notice some irritation and congestion in your nose or some drainage.  This is from the oxygen used during your procedure.  There is no need for concern and it should clear up in a day or so.  SYMPTOMS TO REPORT IMMEDIATELY    Following upper endoscopy (EGD)  Vomiting of blood or coffee ground material  New chest pain or pain under the shoulder blades  Painful or persistently difficult swallowing  New shortness of breath  Fever of 100F or higher  Black, tarry-looking stools  For urgent or emergent issues, a gastroenterologist can be reached at any hour by calling [phone].   DIET:  We do recommend a small meal at first, but then you may proceed to your  regular diet.  Drink plenty of fluids but you should avoid alcoholic beverages for 24 hours.  ACTIVITY:  You should plan to take it easy for the rest of today and you should NOT DRIVE or use heavy machinery until tomorrow (because of the sedation medicines used during the test).    FOLLOW UP: Our staff will call the number listed on your records 48-72 hours following your procedure to check on you and address any questions or concerns that you may have regarding the information given to you following your procedure. If we do not reach you, we will leave a message.  We will attempt to reach you two times.  During this call, we will ask if you have developed any symptoms of COVID 19. If you develop any symptoms (ie: fever, flu-like symptoms, shortness of breath, cough etc.) before then, please call [phone].  If you test positive for Covid 19 in the 2 weeks post procedure, please call and report this information to [place].    If any biopsies were taken you will be contacted by phone or by letter within the next 1-3 weeks.  Please call us at [phone] if you have not heard about the biopsies in 3 weeks.    SIGNATURES/CONFIDENTIALITY: You and/or your care partner have signed paperwork which will be entered into your electronic medical record.  These signatures attest to the fact that that the information above on your After Visit Summary  has been reviewed and is understood.  Full responsibility of the confidentiality of this discharge information lies with you and/or your care-partner. 

## 2019-08-10 ENCOUNTER — Telehealth: Payer: Self-pay

## 2019-08-10 ENCOUNTER — Telehealth: Payer: Self-pay | Admitting: *Deleted

## 2019-08-10 NOTE — Telephone Encounter (Signed)
Message left

## 2019-08-10 NOTE — Telephone Encounter (Signed)
  Follow up Call-  Call back number 08/06/2019  Post procedure Call Back phone  # [phone]  Permission to leave phone message Yes  Some recent data might be hidden     Patient questions:  Do you have a fever, pain , or abdominal swelling? No. Pain Score  0 *  Have you tolerated food without any problems? Yes.    Have you been able to return to your normal activities? Yes.    Do you have any questions about your discharge instructions: Diet   No. Medications  No. Follow up visit  No.  Do you have questions or concerns about your Care? No.  Actions: * If pain score is 4 or above: No action needed, pain <4.  1. Have you developed a fever since your procedure? no  2.   Have you had an respiratory symptoms (SOB or cough) since your procedure? no  3.   Have you tested positive for COVID 19 since your procedure no  4.   Have you had any family members/close contacts diagnosed with the COVID 19 since your procedure?  no   If yes to any of these questions please route to Laverna Peace, RN and Jennye Boroughs, Charity fundraiser.

## 2019-08-17 ENCOUNTER — Encounter: Payer: Self-pay | Admitting: Gastroenterology

## 2019-08-23 ENCOUNTER — Other Ambulatory Visit: Payer: Self-pay | Admitting: Family Medicine

## 2019-08-23 MED FILL — ATORVASTATIN 10 MG TABLET: 10 | 90 days supply | Qty: 90 | Fill #0

## 2019-08-31 MED FILL — TESTOSTERONE CYP 200 MG/ML: 200 | 28 days supply | Qty: 2 | Fill #2

## 2019-08-31 MED FILL — ANASTROZOLE 1 MG TABLET: 1 | 30 days supply | Qty: 15 | Fill #0

## 2019-09-08 MED FILL — FLUTICASONE PROP 50 MCG SPR: 50 | 90 days supply | Qty: 48 | Fill #0

## 2019-09-21 MED FILL — ALLOPURINOL 100 MG TABS: 100 | 30 days supply | Qty: 60 | Fill #3

## 2019-09-23 ENCOUNTER — Ambulatory Visit: Payer: Medicare PPO | Attending: Internal Medicine

## 2019-09-23 DIAGNOSIS — Z23 Encounter for immunization: Secondary | ICD-10-CM | POA: Insufficient documentation

## 2019-09-23 NOTE — Progress Notes (Signed)
   Covid-19 Vaccination Clinic  Name:  Taylor Mcdonald    MRN: [id number] DOB: [birth year]  09/23/2019  [name] was observed post Covid-19 immunization for 15 minutes without incidence. He was provided with Vaccine Information Sheet and instruction to access the V-Safe system.   [name] was instructed to call 911 with any severe reactions post vaccine: Marland Kitchen Difficulty breathing  . Swelling of your face and throat  . A fast heartbeat  . A bad rash all over your body  . Dizziness and weakness    Immunizations Administered    Name Date Dose VIS Date Route   Pfizer COVID-19 Vaccine 09/23/2019  3:26 PM 0.3 mL 07/09/2019 Intramuscular   Manufacturer: [employer], [employer]   Lot: [id number]   NDC: [address]-1

## 2019-09-30 MED FILL — ANASTROZOLE 1 MG TABLET: 1 | 30 days supply | Qty: 15 | Fill #1

## 2019-10-04 ENCOUNTER — Other Ambulatory Visit: Payer: Self-pay | Admitting: Family Medicine

## 2019-10-04 DIAGNOSIS — G4700 Insomnia, unspecified: Secondary | ICD-10-CM

## 2019-10-04 MED FILL — TESTOSTERONE CYP 200 MG/ML: 200 | 28 days supply | Qty: 2 | Fill #3

## 2019-10-04 MED FILL — PROPRANOLOL 40 MG TABLET: 40 | 90 days supply | Qty: 90 | Fill #0

## 2019-10-04 MED FILL — OMEPRAZOLE 20 MG CAP: 20 | 90 days supply | Qty: 90 | Fill #2

## 2019-10-04 NOTE — Telephone Encounter (Signed)
Requesting:ambien  Contract:yes UDS:n/a Last OV:06/22/19 Next OV:01/20/20   Last Refill:01/26/2019 [id number] Database:   Please advise

## 2019-10-05 MED FILL — ZOLPIDEM TARTRATE 10 MG TAB: 10 | 30 days supply | Qty: 30 | Fill #0

## 2019-10-19 ENCOUNTER — Ambulatory Visit: Payer: Medicare PPO

## 2019-10-26 MED FILL — LISINOPRIL 20 MG TABS: 20 | 90 days supply | Qty: 90 | Fill #2

## 2019-10-27 ENCOUNTER — Ambulatory Visit: Payer: Medicare PPO | Attending: Internal Medicine

## 2019-10-27 DIAGNOSIS — Z23 Encounter for immunization: Secondary | ICD-10-CM

## 2019-10-27 NOTE — Progress Notes (Signed)
   Covid-19 Vaccination Clinic  Name:  DASHIELL FRANCHINO    MRN: [id number] DOB: 07-25-1956  10/27/2019  Mr. Nimmons was observed post Covid-19 immunization for 15 minutes without incident. He was provided with Vaccine Information Sheet and instruction to access the V-Safe system.   Mr. Imes was instructed to call 911 with any severe reactions post vaccine: Marland Kitchen Difficulty breathing  . Swelling of face and throat  . A fast heartbeat  . A bad rash all over body  . Dizziness and weakness   Immunizations Administered    Name Date Dose VIS Date Route   Pfizer COVID-19 Vaccine 10/27/2019  8:59 AM 0.3 mL 07/09/2019 Intramuscular   Manufacturer: [employer], [employer]   Lot: [id number]   NDC: [address]-9

## 2019-11-05 MED FILL — ANASTROZOLE 1 MG TABLET: 1 | 30 days supply | Qty: 15 | Fill #2

## 2019-11-16 MED FILL — TESTOSTERONE CYP 200 MG/ML: 200 | 70 days supply | Qty: 5 | Fill #0

## 2019-11-18 ENCOUNTER — Other Ambulatory Visit (HOSPITAL_BASED_OUTPATIENT_CLINIC_OR_DEPARTMENT_OTHER): Payer: Self-pay | Admitting: Urology

## 2019-11-18 DIAGNOSIS — N5201 Erectile dysfunction due to arterial insufficiency: Secondary | ICD-10-CM | POA: Diagnosis not present

## 2019-11-18 DIAGNOSIS — E291 Testicular hypofunction: Secondary | ICD-10-CM | POA: Diagnosis not present

## 2019-11-30 ENCOUNTER — Other Ambulatory Visit: Payer: Self-pay | Admitting: Family Medicine

## 2019-11-30 MED FILL — ATORVASTATIN 10 MG TABLET: 10 | 90 days supply | Qty: 90 | Fill #1

## 2019-11-30 MED FILL — ALLOPURINOL 100 MG TABS: 100 | 30 days supply | Qty: 60 | Fill #0

## 2019-12-02 MED FILL — ANASTROZOLE 1 MG TABLET: 1 | 90 days supply | Qty: 45 | Fill #0

## 2020-01-05 ENCOUNTER — Other Ambulatory Visit: Payer: Self-pay | Admitting: Family Medicine

## 2020-01-20 ENCOUNTER — Ambulatory Visit: Payer: BC Managed Care – PPO | Admitting: Family Medicine

## 2020-01-24 MED FILL — TESTOSTERONE CYP 200 MG/ML: 200 | 70 days supply | Qty: 5 | Fill #1

## 2020-01-24 MED FILL — LISINOPRIL 20 MG TABS: 20 | 90 days supply | Qty: 90 | Fill #3

## 2020-02-04 MED FILL — ALLOPURINOL 100 MG TABS: 100 | 30 days supply | Qty: 60 | Fill #1

## 2020-02-08 ENCOUNTER — Other Ambulatory Visit: Payer: Self-pay

## 2020-02-08 ENCOUNTER — Ambulatory Visit (INDEPENDENT_AMBULATORY_CARE_PROVIDER_SITE_OTHER): Payer: Medicare PPO | Admitting: Family Medicine

## 2020-02-08 VITALS — BP 112/80 | HR 67 | Temp 98.1°F | Resp 18 | Ht 74.0 in | Wt 202.0 lb

## 2020-02-08 DIAGNOSIS — E559 Vitamin D deficiency, unspecified: Secondary | ICD-10-CM

## 2020-02-08 DIAGNOSIS — R739 Hyperglycemia, unspecified: Secondary | ICD-10-CM

## 2020-02-08 DIAGNOSIS — I10 Essential (primary) hypertension: Secondary | ICD-10-CM

## 2020-02-08 DIAGNOSIS — E782 Mixed hyperlipidemia: Secondary | ICD-10-CM

## 2020-02-08 DIAGNOSIS — K219 Gastro-esophageal reflux disease without esophagitis: Secondary | ICD-10-CM

## 2020-02-08 DIAGNOSIS — R351 Nocturia: Secondary | ICD-10-CM

## 2020-02-08 DIAGNOSIS — M1A9XX0 Chronic gout, unspecified, without tophus (tophi): Secondary | ICD-10-CM | POA: Diagnosis not present

## 2020-02-08 LAB — CBC
HCT: 49.8 % (ref 39.0–52.0)
Hemoglobin: 16.7 g/dL (ref 13.0–17.0)
MCHC: 33.5 g/dL (ref 30.0–36.0)
MCV: 86.2 fl (ref 78.0–100.0)
Platelets: 248 10*3/uL (ref 150.0–400.0)
RBC: 5.78 Mil/uL (ref 4.22–5.81)
RDW: 14.8 % (ref 11.5–15.5)
WBC: 8.8 10*3/uL (ref 4.0–10.5)

## 2020-02-08 LAB — LIPID PANEL
Cholesterol: 133 mg/dL (ref 0–200)
HDL: 47.9 mg/dL (ref >39.00)
LDL Cholesterol: 61 mg/dL (ref 0–99)
NonHDL: 85.41
Total CHOL/HDL Ratio: 3
Triglycerides: 121 mg/dL (ref 0.0–149.0)
VLDL: 24.2 mg/dL (ref 0.0–40.0)

## 2020-02-08 LAB — COMPREHENSIVE METABOLIC PANEL
ALT: 26 U/L (ref 0–53)
AST: 22 U/L (ref 0–37)
Albumin: 4.7 g/dL (ref 3.5–5.2)
Alkaline Phosphatase: 59 U/L (ref 39–117)
BUN: 20 mg/dL (ref 6–23)
CO2: 28 mEq/L (ref 19–32)
Calcium: 10 mg/dL (ref 8.4–10.5)
Chloride: 98 mEq/L (ref 96–112)
Creatinine, Ser: 1.1 mg/dL (ref 0.40–1.50)
GFR: 67.08 mL/min (ref >60.00)
Glucose, Bld: 94 mg/dL (ref 70–99)
Potassium: 4.6 mEq/L (ref 3.5–5.1)
Sodium: 134 mEq/L — ABNORMAL LOW (ref 135–145)
Total Bilirubin: 1.3 mg/dL — ABNORMAL HIGH (ref 0.2–1.2)
Total Protein: 6.9 g/dL (ref 6.0–8.3)

## 2020-02-08 LAB — HEMOGLOBIN A1C: Hgb A1c MFr Bld: 5.4 % (ref 4.6–6.5)

## 2020-02-08 LAB — TSH: TSH: 0.4 u[IU]/mL (ref 0.35–4.50)

## 2020-02-08 LAB — VITAMIN D 25 HYDROXY (VIT D DEFICIENCY, FRACTURES): VITD: 46.33 ng/mL (ref 30.00–100.00)

## 2020-02-09 ENCOUNTER — Other Ambulatory Visit: Payer: Self-pay | Admitting: *Deleted

## 2020-02-09 DIAGNOSIS — E871 Hypo-osmolality and hyponatremia: Secondary | ICD-10-CM

## 2020-02-09 NOTE — Assessment & Plan Note (Signed)
Supplement and monitor 

## 2020-02-09 NOTE — Progress Notes (Signed)
Subjective:    Patient ID: Taylor Mcdonald, male    DOB: [birth year], 64 y.o.   MRN: [id number]  Chief Complaint  Patient presents with  . Follow-up    6 month follow - up , doing well    HPI Patient is in today for follow up on chronic medical concerns. No recent febrile illness or hospitalizations. He is feeling well and he denies any acute concerns. No gout or polyuria or polydipsia noted. Denies CP/palp/SOB/HA/congestion/fevers/GI or GU c/o. Taking meds as prescribed  Past Medical History:  Diagnosis Date  . Allergy   . Anxiety 03/04/2012  . Fatigue   . GERD (gastroesophageal reflux disease)   . Gout   . Hyperglycemia   . Hyperlipidemia   . Hypertension   . Insomnia   . Internal nasal lesion 07/04/2016  . Low testosterone   . Lower back pain 09/09/2012  . Migraine   . Pain of left heel 10/01/2011  . Preventative health care 07/24/2012  . RLS (restless legs syndrome)   . Sinusitis 03/04/2012  . Varicose veins of both lower extremities 12/12/2016  . Vasovagal near syncope 04/30/2013  . Vitamin D deficiency 12/23/2011    Past Surgical History:  Procedure Laterality Date  . LUMBAR LAMINECTOMY/DECOMPRESSION MICRODISCECTOMY N/A 05/13/2013   Procedure: LUMBAR LAMINECTOMY/DECOMPRESSION MICRODISCECTOMY 1 LEVEL L3-4;  Surgeon: Javier Docker, MD;  Location: [facility];  Service: Orthopedics;  Laterality: N/A;  . PANENDOSCOPY    . TONSILLECTOMY      Family History  Problem Relation Age of Onset  . Obesity Mother   . Hypertension Son   . Hypertension Maternal Grandmother   . Obesity Maternal Grandmother   . Cancer Maternal Grandmother 50       intestinal  . Dementia Maternal Grandfather   . Colon cancer Neg Hx   . Esophageal cancer Neg Hx   . Rectal cancer Neg Hx   . Stomach cancer Neg Hx     Social History   Socioeconomic History  . Marital status: Married    Spouse name: Not on file  . Number of children: Not on file  . Years of education: Not on file  . Highest  education level: Not on file  Occupational History  . Not on file  Tobacco Use  . Smoking status: Never Smoker  . Smokeless tobacco: Never Used  Vaping Use  . Vaping Use: Never used  Substance and Sexual Activity  . Alcohol use: No  . Drug use: Not Currently    Types: Hydrocodone    Comment: Pt states he does not use anymore.  Marland Kitchen Sexual activity: Yes  Other Topics Concern  . Not on file  Social History Narrative  . Not on file   Social Determinants of Health   Financial Resource Strain:   . Difficulty of Paying Living Expenses:   Food Insecurity:   . Worried About Programme researcher, broadcasting/film/video in the Last Year:   . Barista in the Last Year:   Transportation Needs:   . Freight forwarder (Medical):   Marland Kitchen Lack of Transportation (Non-Medical):   Physical Activity:   . Days of Exercise per Week:   . Minutes of Exercise per Session:   Stress:   . Feeling of Stress :   Social Connections:   . Frequency of Communication with Friends and Family:   . Frequency of Social Gatherings with Friends and Family:   . Attends Religious Services:   . Active Member of  Clubs or Organizations:   . Attends Banker Meetings:   Marland Kitchen Marital Status:   Intimate Partner Violence:   . Fear of Current or Ex-Partner:   . Emotionally Abused:   Marland Kitchen Physically Abused:   . Sexually Abused:     Outpatient Medications Prior to Visit  Medication Sig Dispense Refill  . allopurinol (ZYLOPRIM) 100 MG tablet TAKE 2 TABLETS (200 MG TOTAL) BY MOUTH DAILY. 60 tablet 3  . anastrozole (ARIMIDEX) 1 MG tablet Take 0.5 tablets by mouth daily.    Marland Kitchen aspirin 81 MG tablet Take 1 tablet (81 mg total) by mouth daily. Resume 4 days post-op 30 tablet   . atorvastatin (LIPITOR) 10 MG tablet TAKE 1 TABLET (10 MG TOTAL) BY MOUTH DAILY. 90 tablet 1  . Fish Oil-Cholecalciferol (FISH OIL + D3) 1200-1000 MG-UNIT CAPS Take 1 capsule by mouth daily.    . fluticasone (FLONASE) 50 MCG/ACT nasal spray PLACE 2 SPRAYS INTO  THE NOSE DAILY. 48 g 3  . lisinopril (ZESTRIL) 20 MG tablet TAKE 1 TABLET (20 MG TOTAL) BY MOUTH DAILY. 90 tablet 3  . omeprazole (PRILOSEC) 20 MG capsule TAKE 1 CAPSULE (20 MG TOTAL) BY MOUTH DAILY. 90 capsule 2  . OVER THE COUNTER MEDICATION 12 hr decongestant 120mg     . propranolol (INDERAL) 40 MG tablet TAKE 1 TABLET (40 MG TOTAL) BY MOUTH DAILY. 90 tablet 0  . zolpidem (AMBIEN) 10 MG tablet TAKE 1/2-1 TABLET (5-10 MG TOTAL) BY MOUTH AT BEDTIME AS NEEDED FOR SLEEP. 30 tablet 5  . sildenafil (REVATIO) 20 MG tablet Take 1-4 tablets (20-80 mg total) by mouth daily as needed. (Patient not taking: Reported on 08/06/2019) 35 tablet 5  . testosterone cypionate (DEPOTESTOSTERONE CYPIONATE) 200 MG/ML injection INJECT 1 ML UNDER THE SKIN EVERY 14 DAYS (Patient not taking: Reported on 08/06/2019) 4 mL 2   No facility-administered medications prior to visit.    No Known Allergies  Review of Systems  Constitutional: Negative for fever and malaise/fatigue.  HENT: Negative for congestion.   Eyes: Negative for blurred vision.  Respiratory: Negative for shortness of breath.   Cardiovascular: Negative for chest pain, palpitations and leg swelling.  Gastrointestinal: Negative for abdominal pain, blood in stool and nausea.  Genitourinary: Negative for dysuria and frequency.  Musculoskeletal: Negative for falls.  Skin: Negative for rash.  Neurological: Negative for dizziness, loss of consciousness and headaches.  Endo/Heme/Allergies: Negative for environmental allergies.  Psychiatric/Behavioral: Negative for depression. The patient is not nervous/anxious.        Objective:    Physical Exam Vitals and nursing note reviewed.  Constitutional:      General: He is not in acute distress.    Appearance: He is well-developed.  HENT:     Head: Normocephalic and atraumatic.     Nose: Nose normal.  Eyes:     General:        Right eye: No discharge.        Left eye: No discharge.  Cardiovascular:      Rate and Rhythm: Normal rate and regular rhythm.     Heart sounds: No murmur heard.   Pulmonary:     Effort: Pulmonary effort is normal.     Breath sounds: Normal breath sounds.  Abdominal:     General: Bowel sounds are normal.     Palpations: Abdomen is soft.     Tenderness: There is no abdominal tenderness.  Musculoskeletal:     Cervical back: Normal range of motion and neck  supple.  Skin:    General: Skin is warm and dry.  Neurological:     Mental Status: He is alert and oriented to person, place, and time.     BP 112/80 (BP Location: Right Arm, Patient Position: Sitting, Cuff Size: Large)   Pulse 67   Temp 98.1 F (36.7 C) (Oral)   Resp 18   Ht 6\' 2"  (1.88 m)   Wt 202 lb (91.6 kg)   SpO2 98%   BMI 25.94 kg/m  Wt Readings from Last 3 Encounters:  02/08/20 202 lb (91.6 kg)  08/06/19 203 lb (92.1 kg)  08/03/19 203 lb 9.6 oz (92.4 kg)    Diabetic Foot Exam - Simple   No data filed     Lab Results  Component Value Date   WBC 8.8 02/08/2020   HGB 16.7 02/08/2020   HCT 49.8 02/08/2020   PLT 248.0 02/08/2020   GLUCOSE 94 02/08/2020   CHOL 133 02/08/2020   TRIG 121.0 02/08/2020   HDL 47.90 02/08/2020   LDLDIRECT 125.7 02/10/2013   LDLCALC 61 02/08/2020   ALT 26 02/08/2020   AST 22 02/08/2020   NA 134 (L) 02/08/2020   K 4.6 02/08/2020   CL 98 02/08/2020   CREATININE 1.10 02/08/2020   BUN 20 02/08/2020   CO2 28 02/08/2020   TSH 0.40 02/08/2020   PSA 0.64 12/25/2017   HGBA1C 5.4 02/08/2020   MICROALBUR <0.7 12/25/2017    Lab Results  Component Value Date   TSH 0.40 02/08/2020   Lab Results  Component Value Date   WBC 8.8 02/08/2020   HGB 16.7 02/08/2020   HCT 49.8 02/08/2020   MCV 86.2 02/08/2020   PLT 248.0 02/08/2020   Lab Results  Component Value Date   NA 134 (L) 02/08/2020   K 4.6 02/08/2020   CO2 28 02/08/2020   GLUCOSE 94 02/08/2020   BUN 20 02/08/2020   CREATININE 1.10 02/08/2020   BILITOT 1.3 (H) 02/08/2020   ALKPHOS 59 02/08/2020    AST 22 02/08/2020   ALT 26 02/08/2020   PROT 6.9 02/08/2020   ALBUMIN 4.7 02/08/2020   CALCIUM 10.0 02/08/2020   GFR 67.08 02/08/2020   Lab Results  Component Value Date   CHOL 133 02/08/2020   Lab Results  Component Value Date   HDL 47.90 02/08/2020   Lab Results  Component Value Date   LDLCALC 61 02/08/2020   Lab Results  Component Value Date   TRIG 121.0 02/08/2020   Lab Results  Component Value Date   CHOLHDL 3 02/08/2020   Lab Results  Component Value Date   HGBA1C 5.4 02/08/2020       Assessment & Plan:   Problem List Items Addressed This Visit    Gout    Hydrate and monitor uric acid levels      Hypertension - Primary    Well controlled, no changes to meds. Encouraged heart healthy diet such as the DASH diet and exercise as tolerated.       Relevant Orders   CBC (Completed)   Comprehensive metabolic panel (Completed)   TSH (Completed)   Hyperlipidemia    Encouraged heart healthy diet, increase exercise, avoid trans fats, consider a krill oil cap daily, tolerating Atorvastatin.       Relevant Orders   Lipid panel (Completed)   GERD (gastroesophageal reflux disease)   Hyperglycemia    hgba1c acceptable, minimize simple carbs. Increase exercise as tolerated.       Relevant Orders  Hemoglobin A1c (Completed)   Vitamin D deficiency    Supplement and monitor      Relevant Orders   VITAMIN D 25 Hydroxy (Vit-D Deficiency, Fractures) (Completed)   Nocturia      I am having Selena LesserSpencer W. Baumgarner maintain his Fish Oil + D3, aspirin, OVER THE COUNTER MEDICATION, testosterone cypionate, sildenafil, omeprazole, lisinopril, fluticasone, anastrozole, atorvastatin, zolpidem, allopurinol, and propranolol.  No orders of the defined types were placed in this encounter.    Danise EdgeStacey Gale Hulse, MD

## 2020-02-09 NOTE — Assessment & Plan Note (Signed)
Encouraged heart healthy diet, increase exercise, avoid trans fats, consider a krill oil cap daily, tolerating Atorvastatin 

## 2020-02-09 NOTE — Assessment & Plan Note (Signed)
hgba1c acceptable, minimize simple carbs. Increase exercise as tolerated.  

## 2020-02-09 NOTE — Assessment & Plan Note (Signed)
Hydrate and monitor uric acid levels 

## 2020-02-09 NOTE — Assessment & Plan Note (Signed)
Well controlled, no changes to meds. Encouraged heart healthy diet such as the DASH diet and exercise as tolerated.  °

## 2020-02-10 MED FILL — ZOLPIDEM TARTRATE 10 MG TAB: 10 | 30 days supply | Qty: 30 | Fill #2

## 2020-02-21 MED FILL — ZOLPIDEM TARTRATE 10 MG TAB: 10 | 30 days supply | Qty: 30 | Fill #2

## 2020-03-06 ENCOUNTER — Other Ambulatory Visit: Payer: Self-pay | Admitting: Family Medicine

## 2020-03-06 MED FILL — ATORVASTATIN CALCIUM 10 MG: 10 | 90 days supply | Qty: 90 | Fill #0

## 2020-03-10 ENCOUNTER — Other Ambulatory Visit: Payer: Self-pay

## 2020-03-10 ENCOUNTER — Other Ambulatory Visit (INDEPENDENT_AMBULATORY_CARE_PROVIDER_SITE_OTHER): Payer: Medicare PPO

## 2020-03-10 DIAGNOSIS — E871 Hypo-osmolality and hyponatremia: Secondary | ICD-10-CM

## 2020-03-10 LAB — COMPREHENSIVE METABOLIC PANEL
ALT: 27 U/L (ref 0–53)
AST: 22 U/L (ref 0–37)
Albumin: 4.5 g/dL (ref 3.5–5.2)
Alkaline Phosphatase: 50 U/L (ref 39–117)
BUN: 17 mg/dL (ref 6–23)
CO2: 29 mEq/L (ref 19–32)
Calcium: 9.6 mg/dL (ref 8.4–10.5)
Chloride: 101 mEq/L (ref 96–112)
Creatinine, Ser: 1.27 mg/dL (ref 0.40–1.50)
GFR: 56.81 mL/min — ABNORMAL LOW (ref >60.00)
Glucose, Bld: 109 mg/dL — ABNORMAL HIGH (ref 70–99)
Potassium: 4.6 mEq/L (ref 3.5–5.1)
Sodium: 137 mEq/L (ref 135–145)
Total Bilirubin: 1 mg/dL (ref 0.2–1.2)
Total Protein: 6.6 g/dL (ref 6.0–8.3)

## 2020-03-15 MED FILL — ANASTROZOLE 1 MG TABLET: 1 | 90 days supply | Qty: 45 | Fill #1

## 2020-03-15 MED FILL — ZOLPIDEM TARTRATE 10 MG TAB: 10 | 30 days supply | Qty: 30 | Fill #3

## 2020-03-31 MED FILL — FLUTICASONE PROP 50 MCG SPR: 50 | 90 days supply | Qty: 48 | Fill #1

## 2020-04-07 MED FILL — FLUTICASONE PROP 50 MCG SPR: 50 | 90 days supply | Qty: 48 | Fill #1

## 2020-04-10 DIAGNOSIS — M542 Cervicalgia: Secondary | ICD-10-CM | POA: Diagnosis not present

## 2020-04-10 MED FILL — predniSONE 5 MG TABS: 5 | 6 days supply | Qty: 21 | Fill #0

## 2020-04-10 MED FILL — METHOCARBAMOL 500 MG TABS: 500 | 10 days supply | Qty: 40 | Fill #0

## 2020-04-12 MED FILL — ALLOPURINOL 100 MG TABLET: 100 | 30 days supply | Qty: 60 | Fill #2

## 2020-04-13 MED FILL — TESTOSTERONE CYP 200 MG/ML: 200 | 70 days supply | Qty: 5 | Fill #2

## 2020-04-24 DIAGNOSIS — K219 Gastro-esophageal reflux disease without esophagitis: Secondary | ICD-10-CM | POA: Diagnosis not present

## 2020-04-24 DIAGNOSIS — Z7982 Long term (current) use of aspirin: Secondary | ICD-10-CM | POA: Diagnosis not present

## 2020-04-24 DIAGNOSIS — J309 Allergic rhinitis, unspecified: Secondary | ICD-10-CM | POA: Diagnosis not present

## 2020-04-24 DIAGNOSIS — G4700 Insomnia, unspecified: Secondary | ICD-10-CM | POA: Diagnosis not present

## 2020-04-24 DIAGNOSIS — M109 Gout, unspecified: Secondary | ICD-10-CM | POA: Diagnosis not present

## 2020-04-24 DIAGNOSIS — M1990 Unspecified osteoarthritis, unspecified site: Secondary | ICD-10-CM | POA: Diagnosis not present

## 2020-04-24 DIAGNOSIS — I10 Essential (primary) hypertension: Secondary | ICD-10-CM | POA: Diagnosis not present

## 2020-04-24 DIAGNOSIS — E291 Testicular hypofunction: Secondary | ICD-10-CM | POA: Diagnosis not present

## 2020-04-24 DIAGNOSIS — E785 Hyperlipidemia, unspecified: Secondary | ICD-10-CM | POA: Diagnosis not present

## 2020-04-25 ENCOUNTER — Other Ambulatory Visit: Payer: Self-pay | Admitting: Family Medicine

## 2020-04-25 MED FILL — OMEPRAZOLE 20 MG CAP: 20 | 90 days supply | Qty: 90 | Fill #0

## 2020-04-25 MED FILL — PROPRANOLOL 40 MG TABLET: 40 | 90 days supply | Qty: 90 | Fill #0

## 2020-04-26 ENCOUNTER — Other Ambulatory Visit: Payer: Self-pay | Admitting: Family Medicine

## 2020-05-02 ENCOUNTER — Other Ambulatory Visit: Payer: Self-pay

## 2020-05-02 MED ORDER — LISINOPRIL 20 MG PO TABS: 20.0000 mg | ORAL_TABLET | Freq: Every day | ORAL | 1 refills | Status: DC

## 2020-05-02 MED FILL — LISINOPRIL 20 MG TABS: 20 | 90 days supply | Qty: 90 | Fill #0

## 2020-05-05 ENCOUNTER — Ambulatory Visit (INDEPENDENT_AMBULATORY_CARE_PROVIDER_SITE_OTHER): Payer: Medicare PPO | Admitting: Urology

## 2020-05-05 ENCOUNTER — Other Ambulatory Visit: Payer: Self-pay | Admitting: Urology

## 2020-05-05 ENCOUNTER — Other Ambulatory Visit: Payer: Self-pay

## 2020-05-05 ENCOUNTER — Encounter: Payer: Self-pay | Admitting: Urology

## 2020-05-05 VITALS — BP 131/88 | HR 62 | Temp 97.9°F | Ht 74.0 in | Wt 202.0 lb

## 2020-05-05 DIAGNOSIS — E291 Testicular hypofunction: Secondary | ICD-10-CM | POA: Insufficient documentation

## 2020-05-05 DIAGNOSIS — N5201 Erectile dysfunction due to arterial insufficiency: Secondary | ICD-10-CM | POA: Diagnosis not present

## 2020-05-05 LAB — URINALYSIS, ROUTINE W REFLEX MICROSCOPIC
Bilirubin, UA: NEGATIVE
Glucose, UA: NEGATIVE
Ketones, UA: NEGATIVE
Leukocytes,UA: NEGATIVE
Nitrite, UA: NEGATIVE
Protein,UA: NEGATIVE
RBC, UA: NEGATIVE
Specific Gravity, UA: 1.01 (ref 1.005–1.030)
Urobilinogen, Ur: 0.2 mg/dL (ref 0.2–1.0)
pH, UA: 6 (ref 5.0–7.5)

## 2020-05-05 MED ORDER — ANASTROZOLE 1 MG PO TABS
0.5000 mg | ORAL_TABLET | Freq: Every day | ORAL | 3 refills | Status: DC
Start: 2020-05-05 — End: 2020-11-14

## 2020-05-05 MED ORDER — TADALAFIL 20 MG PO TABS: 20.0000 mg | ORAL_TABLET | Freq: Every day | ORAL | 5 refills | Status: DC | PRN

## 2020-05-05 MED ORDER — TESTOSTERONE CYPIONATE 200 MG/ML IM SOLN
INTRAMUSCULAR | 3 refills | Status: DC
Start: 2020-05-05 — End: 2020-05-05

## 2020-05-05 NOTE — Patient Instructions (Signed)
Erectile Dysfunction Erectile dysfunction (ED) is the inability to get or keep an erection in order to have sexual intercourse. Erectile dysfunction may include:  Inability to get an erection.  Lack of enough hardness of the erection to allow penetration.  Loss of the erection before sex is finished. What are the causes? This condition may be caused by:  Certain medicines, such as: ? Pain relievers. ? Antihistamines. ? Antidepressants. ? Blood pressure medicines. ? Water pills (diuretics). ? Ulcer medicines. ? Muscle relaxants. ? Drugs.  Excessive drinking.  Psychological causes, such as: ? Anxiety. ? Depression. ? Sadness. ? Exhaustion. ? Performance fear. ? Stress.  Physical causes, such as: ? Artery problems. This may include diabetes, smoking, liver disease, or atherosclerosis. ? High blood pressure. ? Hormonal problems, such as low testosterone. ? Obesity. ? Nerve problems. This may include back or pelvic injuries, diabetes mellitus, multiple sclerosis, or Parkinson disease. What are the signs or symptoms? Symptoms of this condition include:  Inability to get an erection.  Lack of enough hardness of the erection to allow penetration.  Loss of the erection before sex is finished.  Normal erections at some times, but with frequent unsatisfactory episodes.  Low sexual satisfaction in either partner due to erection problems.  A curved penis occurring with erection. The curve may cause pain or the penis may be too curved to allow for intercourse.  Never having nighttime erections. How is this diagnosed? This condition is often diagnosed by:  Performing a physical exam to find other diseases or specific problems with the penis.  Asking you detailed questions about the problem.  Performing blood tests to check for diabetes mellitus or to measure hormone levels.  Performing other tests to check for underlying health conditions.  Performing an ultrasound  exam to check for scarring.  Performing a test to check blood flow to the penis.  Doing a sleep study at home to measure nighttime erections. How is this treated? This condition may be treated by:  Medicine taken by mouth to help you achieve an erection (oral medicine).  Hormone replacement therapy to replace low testosterone levels.  Medicine that is injected into the penis. Your health care provider may instruct you how to give yourself these injections at home.  Vacuum pump. This is a pump with a ring on it. The pump and ring are placed on the penis and used to create pressure that helps the penis become erect.  Penile implant surgery. In this procedure, you may receive: ? An inflatable implant. This consists of cylinders, a pump, and a reservoir. The cylinders can be inflated with a fluid that helps to create an erection, and they can be deflated after intercourse. ? A semi-rigid implant. This consists of two silicone rubber rods. The rods provide some rigidity. They are also flexible, so the penis can both curve downward in its normal position and become straight for sexual intercourse.  Blood vessel surgery, to improve blood flow to the penis. During this procedure, a blood vessel from a different part of the body is placed into the penis to allow blood to flow around (bypass) damaged or blocked blood vessels.  Lifestyle changes, such as exercising more, losing weight, and quitting smoking. Follow these instructions at home: Medicines   Take over-the-counter and prescription medicines only as told by your health care provider. Do not increase the dosage without first discussing it with your health care provider.  If you are using self-injections, perform injections as directed by your   health care provider. Make sure to avoid any veins that are on the surface of the penis. After giving an injection, apply pressure to the injection site for 5 minutes. General  instructions  Exercise regularly, as directed by your health care provider. Work with your health care provider to lose weight, if needed.  Do not use any products that contain nicotine or tobacco, such as cigarettes and e-cigarettes. If you need help quitting, ask your health care provider.  Before using a vacuum pump, read the instructions that come with the pump and discuss any questions with your health care provider.  Keep all follow-up visits as told by your health care provider. This is important. Contact a health care provider if:  You feel nauseous.  You vomit. Get help right away if:  You are taking oral or injectable medicines and you have an erection that lasts longer than 4 hours. If your health care provider is unavailable, go to the nearest emergency room for evaluation. An erection that lasts much longer than 4 hours can result in permanent damage to your penis.  You have severe pain in your groin or abdomen.  You develop redness or severe swelling of your penis.  You have redness spreading up into your groin or lower abdomen.  You are unable to urinate.  You experience chest pain or a rapid heart beat (palpitations) after taking oral medicines. Summary  Erectile dysfunction (ED) is the inability to get or keep an erection during sexual intercourse. This problem can usually be treated successfully.  This condition is diagnosed based on a physical exam, your symptoms, and tests to determine the cause. Treatment varies depending on the cause, and may include medicines, hormone therapy, surgery, or vacuum pump.  You may need follow-up visits to make sure that you are using your medicines or devices correctly.  Get help right away if you are taking or injecting medicines and you have an erection that lasts longer than 4 hours. This information is not intended to replace advice given to you by your health care provider. Make sure you discuss any questions you have with  your health care provider. Document Revised: 06/27/2017 Document Reviewed: 07/31/2016 Elsevier Patient Education  2020 [employer]  

## 2020-05-05 NOTE — Addendum Note (Signed)
Addended by: Malen Gauze on: 05/05/2020 11:15 AM   Modules accepted: Orders

## 2020-05-05 NOTE — Progress Notes (Signed)
Urological Symptom Review  Patient is experiencing the following symptoms: Erection problems (male only)   Review of Systems  Gastrointestinal (upper)  : Indigestion/heartburn  Gastrointestinal (lower) : Negative for lower GI symptoms  Constitutional : Negative for symptoms  Skin: Negative for skin symptoms  Eyes: Negative for eye symptoms  Ear/Nose/Throat : Negative for Ear/Nose/Throat symptoms  Hematologic/Lymphatic: Negative for Hematologic/Lymphatic symptoms  Cardiovascular : Negative for cardiovascular symptoms  Respiratory : Negative for respiratory symptoms  Endocrine: Negative for endocrine symptoms  Musculoskeletal: Negative for musculoskeletal symptoms  Neurological: Negative for neurological symptoms  Psychologic: Negative for psychiatric symptoms  

## 2020-05-05 NOTE — Addendum Note (Signed)
Addended by: Ferdinand Lango on: 05/05/2020 11:42 AM   Modules accepted: Orders

## 2020-05-05 NOTE — Progress Notes (Signed)
05/05/2020 10:55 AM   Taylor Mcdonald Dec 19, 1955 [id number]  Referring provider: Bradd Canary, MD [address] [place],  [place] [address]  followup Erectile dysfunction and hypogonadism  HPI: [name] is a 64yo here for followup for hypogonadism and erectile dysfunction. No recent labs. He injects 200mg  every 2 weeks. Good energy. Good libido. He takes sildenafil 60-80mg  prn for ED which works well.    His records from [facility] are as follows: I have a decreased testosterone level.  HPI: Taylor Mcdonald is a 64 year-old male established patient who is here for a decreased testosterone level.  He has had the symptoms for 3 years. His symptoms have not gotten worse over the last year. He does become fatigued easily. He has not gained weight.   His sex drive has not decreased. He does have problems with erections. He does not have trouble reaching climax. He does not have normal ejaculate volume.   He has not had injuries to the testicles or scrotum. He has had the following surgeries: Vasectomy. The patient denies having the following scrotal surgeries: Hernia Repair, Hydrocele Repair, Undescended Testis Surgery, and Varicocele Repair.   02/27/2018: He has been on TRT for the past 2 years. He has been on IM testosterone 200mg  every 14 days. He initially started with testosterone gel which did not work for him. His last testosterone was 116. TSH normal   03/31/2018: He was started on anastrazole 0.5mg  10 days ago. estradiol was 169.   07/02/2018: He is on IM testosterone 200mg  q 2 weeks and anastrazole 0.5mg  daily. Testosterone was drawn 4 days after his injection and testosterone was 1200 and estradiol was 62.   10/09/2018: He is injecting 200mg  IM every 2 weeks and anastrazole 0.5mg  . testosterone 715, estrodaiol 24, Hgb 15.8. CMP normal   05/07/2019: testosterone 642. estradiol <15. CBC and CMP normal   11/18/2019: Testosterone 200mg  q 2 weeks. testosterone 1000. estradiol  20. Hgb 16.3. CMP normal     CC: I am having trouble with my erections.  HPI: He first stated noticing pain on approximately 02/27/2015. His symptoms did begin gradually. His symptoms have been stable over the last year.   He does have difficulties achieving an erection. He does have problems maintaining his erections. His erections are straight. He has tried Viagra. It did not work.   He does not have premature ejaculation. He does not have trouble reaching climax. He does have anxiety because of the symptoms.   02/27/2018: He has previously tried sildenafil with mixed results   07/02/2018: He is using tadalafil with good results   10/09/2018: Pt has used tadafil multiple times since last visit which worked well. testosterone was 715   11/18/2019: He gets good erections with sildeanfil 60mg        PMH: Past Medical History:  Diagnosis Date  . Allergy   . Anxiety 03/04/2012  . Fatigue   . GERD (gastroesophageal reflux disease)   . Gout   . Hyperglycemia   . Hyperlipidemia   . Hypertension   . Insomnia   . Internal nasal lesion 07/04/2016  . Low testosterone   . Lower back pain 09/09/2012  . Migraine   . Pain of left heel 10/01/2011  . Preventative health care 07/24/2012  . RLS (restless legs syndrome)   . Sinusitis 03/04/2012  . Varicose veins of both lower extremities 12/12/2016  . Vasovagal near syncope 04/30/2013  . Vitamin D deficiency 12/23/2011    Surgical  History: Past Surgical History:  Procedure Laterality Date  . LUMBAR LAMINECTOMY/DECOMPRESSION MICRODISCECTOMY N/A 05/13/2013   Procedure: LUMBAR LAMINECTOMY/DECOMPRESSION MICRODISCECTOMY 1 LEVEL L3-4;  Surgeon: Javier Docker, MD;  Location: [facility];  Service: Orthopedics;  Laterality: N/A;  . PANENDOSCOPY    . TONSILLECTOMY      Home Medications:  Allergies as of 05/05/2020   No Known Allergies     Medication List       Accurate as of May 05, 2020 10:55 AM. If you have any questions, ask your nurse or doctor.          allopurinol 100 MG tablet Commonly known as: ZYLOPRIM TAKE 2 TABLETS (200 MG TOTAL) BY MOUTH DAILY.   anastrozole 1 MG tablet Commonly known as: ARIMIDEX Take 0.5 tablets by mouth daily.   aspirin 81 MG tablet Take 1 tablet (81 mg total) by mouth daily. Resume 4 days post-op   atorvastatin 10 MG tablet Commonly known as: LIPITOR TAKE 1 TABLET BY MOUTH ONCE DAILY   Fish Oil + D3 1200-1000 MG-UNIT Caps Take 1 capsule by mouth daily.   fluticasone 50 MCG/ACT nasal spray Commonly known as: FLONASE PLACE 2 SPRAYS INTO THE NOSE DAILY.   lisinopril 20 MG tablet Commonly known as: ZESTRIL Take 1 tablet (20 mg total) by mouth daily.   omeprazole 20 MG capsule Commonly known as: PRILOSEC Take 1 capsule (20 mg total) by mouth daily.   OVER THE COUNTER MEDICATION 12 hr decongestant 120mg    propranolol 40 MG tablet Commonly known as: INDERAL Take 1 tablet (40 mg total) by mouth daily.   sildenafil 20 MG tablet Commonly known as: Revatio Take 1-4 tablets (20-80 mg total) by mouth daily as needed.   testosterone cypionate 200 MG/ML injection Commonly known as: DEPOTESTOSTERONE CYPIONATE INJECT 1 ML UNDER THE SKIN EVERY 14 DAYS   zolpidem 10 MG tablet Commonly known as: AMBIEN TAKE 1/2-1 TABLET (5-10 MG TOTAL) BY MOUTH AT BEDTIME AS NEEDED FOR SLEEP.       Allergies: No Known Allergies  Family History: Family History  Problem Relation Age of Onset  . Obesity Mother   . Hypertension Son   . Hypertension Maternal Grandmother   . Obesity Maternal Grandmother   . Cancer Maternal Grandmother 50       intestinal  . Dementia Maternal Grandfather   . Colon cancer Neg Hx   . Esophageal cancer Neg Hx   . Rectal cancer Neg Hx   . Stomach cancer Neg Hx     Social History:  reports that he has never smoked. He has never used smokeless tobacco. He reports previous drug use. Drug: Hydrocodone. He reports that he does not drink alcohol.  ROS: All other review of  systems were reviewed and are negative except what is noted above in HPI  Physical Exam: BP 131/88   Pulse 62   Temp 97.9 F (36.6 C)   Ht 6\' 2"  (1.88 m)   Wt 202 lb (91.6 kg)   BMI 25.94 kg/m   Constitutional:  Alert and oriented, No acute distress. HEENT: [place] AT, moist mucus membranes.  Trachea midline, no masses. Cardiovascular: No clubbing, cyanosis, or edema. Respiratory: Normal respiratory effort, no increased work of breathing. GI: Abdomen is soft, nontender, nondistended, no abdominal masses GU: No CVA tenderness.  Lymph: No cervical or inguinal lymphadenopathy. Skin: No rashes, bruises or suspicious lesions. Neurologic: Grossly intact, no focal deficits, moving all 4 extremities. Psychiatric: Normal mood and affect.  Laboratory Data: Lab Results  Component Value Date   WBC 8.8 02/08/2020   HGB 16.7 02/08/2020   HCT 49.8 02/08/2020   MCV 86.2 02/08/2020   PLT 248.0 02/08/2020    Lab Results  Component Value Date   CREATININE 1.27 03/10/2020    Lab Results  Component Value Date   PSA 0.64 12/25/2017   PSA 0.63 11/23/2015   PSA 0.50 08/25/2014    Lab Results  Component Value Date   TESTOSTERONE 116.31 (L) 12/25/2017    Lab Results  Component Value Date   HGBA1C 5.4 02/08/2020    Urinalysis    Component Value Date/Time   COLORURINE YELLOW 12/25/2017 1037   APPEARANCEUR Clear 05/05/2020 1042   LABSPEC 1.010 12/25/2017 1037   PHURINE 6.0 12/25/2017 1037   GLUCOSEU Negative 05/05/2020 1042   GLUCOSEU NEGATIVE 12/25/2017 1037   HGBUR NEGATIVE 12/25/2017 1037   HGBUR trace-intact 07/25/2009 0000   BILIRUBINUR Negative 05/05/2020 1042   KETONESUR NEGATIVE 12/25/2017 1037   PROTEINUR Negative 05/05/2020 1042   UROBILINOGEN 0.2 12/25/2017 1037   NITRITE Negative 05/05/2020 1042   NITRITE NEGATIVE 12/25/2017 1037   LEUKOCYTESUR Negative 05/05/2020 1042    Lab Results  Component Value Date   LABMICR Comment 05/05/2020    Pertinent  Imaging:  No results found for this or any previous visit.  No results found for this or any previous visit.  No results found for this or any previous visit.  No results found for this or any previous visit.  No results found for this or any previous visit.  No results found for this or any previous visit.  No results found for this or any previous visit.  No results found for this or any previous visit.   Assessment & Plan:    1. Hypogonadism in male -testosterone labs today -Continue IM testosterone 200mg  every 2 weeks -RTC 6 months - Urinalysis, Routine w reflex microscopic  2. Erectile dysfunction due to arterial insufficiency We will switch to tadalafil 20mg  prn   No follow-ups on file.  , MD  [facility] [place]

## 2020-05-09 ENCOUNTER — Other Ambulatory Visit: Payer: Self-pay | Admitting: Family Medicine

## 2020-05-09 DIAGNOSIS — G4700 Insomnia, unspecified: Secondary | ICD-10-CM

## 2020-05-09 MED FILL — ZOLPIDEM TARTRATE 10 MG TAB: 10 | 30 days supply | Qty: 30 | Fill #0

## 2020-05-09 NOTE — Telephone Encounter (Signed)
Last written:10/04/19 Last ov:02/08/20 Next ov:08/10/20 Contract: UDS:08/31/17

## 2020-05-10 LAB — CBC WITH DIFFERENTIAL
Basophils Absolute: 0.1 10*3/uL (ref 0.0–0.2)
Basos: 1 %
EOS (ABSOLUTE): 0.3 10*3/uL (ref 0.0–0.4)
Eos: 3 %
Hematocrit: 50.1 % (ref 37.5–51.0)
Hemoglobin: 16.9 g/dL (ref 13.0–17.7)
Immature Grans (Abs): 0 10*3/uL (ref 0.0–0.1)
Immature Granulocytes: 0 %
Lymphocytes Absolute: 2.9 10*3/uL (ref 0.7–3.1)
Lymphs: 33 %
MCH: 29.3 pg (ref 26.6–33.0)
MCHC: 33.7 g/dL (ref 31.5–35.7)
MCV: 87 fL (ref 79–97)
Monocytes Absolute: 0.8 10*3/uL (ref 0.1–0.9)
Monocytes: 9 %
Neutrophils Absolute: 4.7 10*3/uL (ref 1.4–7.0)
Neutrophils: 54 %
RBC: 5.77 x10E6/uL (ref 4.14–5.80)
RDW: 14.3 % (ref 11.6–15.4)
WBC: 8.8 10*3/uL (ref 3.4–10.8)

## 2020-05-10 LAB — COMPREHENSIVE METABOLIC PANEL
ALT: 27 IU/L (ref 0–44)
AST: 20 IU/L (ref 0–40)
Albumin/Globulin Ratio: 2.1 (ref 1.2–2.2)
Albumin: 4.6 g/dL (ref 3.8–4.8)
Alkaline Phosphatase: 58 IU/L (ref 44–121)
BUN/Creatinine Ratio: 12 (ref 10–24)
BUN: 13 mg/dL (ref 8–27)
Bilirubin Total: 0.8 mg/dL (ref 0.0–1.2)
CO2: 21 mmol/L (ref 20–29)
Calcium: 9.9 mg/dL (ref 8.6–10.2)
Chloride: 99 mmol/L (ref 96–106)
Creatinine, Ser: 1.09 mg/dL (ref 0.76–1.27)
GFR calc Af Amer: 82 mL/min/{1.73_m2} (ref >59)
GFR calc non Af Amer: 71 mL/min/{1.73_m2} (ref >59)
Globulin, Total: 2.2 g/dL (ref 1.5–4.5)
Glucose: 106 mg/dL — ABNORMAL HIGH (ref 65–99)
Potassium: 4.6 mmol/L (ref 3.5–5.2)
Sodium: 137 mmol/L (ref 134–144)
Total Protein: 6.8 g/dL (ref 6.0–8.5)

## 2020-05-10 LAB — TESTOSTERONE,FREE AND TOTAL
Testosterone, Free: 13.9 pg/mL (ref 6.6–18.1)
Testosterone: 688 ng/dL (ref 264–916)

## 2020-05-10 LAB — ESTRADIOL: Estradiol: <5 pg/mL — ABNORMAL LOW (ref 7.6–42.6)

## 2020-05-17 NOTE — Progress Notes (Signed)
Sent via mychart

## 2020-06-07 MED FILL — ATORVASTATIN CALCIUM 10 MG: 10 | 90 days supply | Qty: 90 | Fill #1

## 2020-06-21 MED FILL — FLUTICASONE PROP 50 MCG SPR: 50 | 90 days supply | Qty: 48 | Fill #2

## 2020-06-23 MED FILL — ANASTROZOLE 1 MG TABLET: 1 | 90 days supply | Qty: 45 | Fill #2

## 2020-06-23 MED FILL — TESTOSTERONE CYP 200 MG/ML: 200 | 70 days supply | Qty: 5 | Fill #0

## 2020-06-26 DIAGNOSIS — D0461 Carcinoma in situ of skin of right upper limb, including shoulder: Secondary | ICD-10-CM | POA: Diagnosis not present

## 2020-06-26 DIAGNOSIS — D1801 Hemangioma of skin and subcutaneous tissue: Secondary | ICD-10-CM | POA: Diagnosis not present

## 2020-06-26 DIAGNOSIS — L814 Other melanin hyperpigmentation: Secondary | ICD-10-CM | POA: Diagnosis not present

## 2020-06-26 DIAGNOSIS — L821 Other seborrheic keratosis: Secondary | ICD-10-CM | POA: Diagnosis not present

## 2020-06-26 DIAGNOSIS — L570 Actinic keratosis: Secondary | ICD-10-CM | POA: Diagnosis not present

## 2020-06-26 DIAGNOSIS — D225 Melanocytic nevi of trunk: Secondary | ICD-10-CM | POA: Diagnosis not present

## 2020-06-26 DIAGNOSIS — Z85828 Personal history of other malignant neoplasm of skin: Secondary | ICD-10-CM | POA: Diagnosis not present

## 2020-06-26 DIAGNOSIS — D2371 Other benign neoplasm of skin of right lower limb, including hip: Secondary | ICD-10-CM | POA: Diagnosis not present

## 2020-07-10 ENCOUNTER — Other Ambulatory Visit: Payer: Self-pay

## 2020-07-10 MED ORDER — ALLOPURINOL 100 MG PO TABS: ORAL_TABLET | ORAL | 3 refills | Status: DC

## 2020-07-10 MED FILL — ALLOPURINOL 100 MG TABS: 100 | 90 days supply | Qty: 180 | Fill #0

## 2020-07-10 MED FILL — ZOLPIDEM TARTRATE 10 MG TAB: 10 | 30 days supply | Qty: 30 | Fill #1

## 2020-08-07 MED FILL — PROPRANOLOL 40 MG TABLET: 40 | 90 days supply | Qty: 90 | Fill #1

## 2020-08-07 MED FILL — LISINOPRIL 20 MG TABS: 20 | 90 days supply | Qty: 90 | Fill #1

## 2020-08-10 ENCOUNTER — Ambulatory Visit (INDEPENDENT_AMBULATORY_CARE_PROVIDER_SITE_OTHER): Payer: Medicare PPO | Admitting: Family Medicine

## 2020-08-10 ENCOUNTER — Other Ambulatory Visit: Payer: Self-pay | Admitting: Family Medicine

## 2020-08-10 ENCOUNTER — Encounter: Payer: Self-pay | Admitting: Family Medicine

## 2020-08-10 ENCOUNTER — Other Ambulatory Visit: Payer: Self-pay

## 2020-08-10 DIAGNOSIS — K5790 Diverticulosis of intestine, part unspecified, without perforation or abscess without bleeding: Secondary | ICD-10-CM | POA: Diagnosis not present

## 2020-08-10 DIAGNOSIS — M1A9XX0 Chronic gout, unspecified, without tophus (tophi): Secondary | ICD-10-CM | POA: Diagnosis not present

## 2020-08-10 DIAGNOSIS — R739 Hyperglycemia, unspecified: Secondary | ICD-10-CM

## 2020-08-10 DIAGNOSIS — I10 Essential (primary) hypertension: Secondary | ICD-10-CM | POA: Diagnosis not present

## 2020-08-10 DIAGNOSIS — E559 Vitamin D deficiency, unspecified: Secondary | ICD-10-CM

## 2020-08-10 DIAGNOSIS — S61219A Laceration without foreign body of unspecified finger without damage to nail, initial encounter: Secondary | ICD-10-CM | POA: Diagnosis not present

## 2020-08-10 DIAGNOSIS — N281 Cyst of kidney, acquired: Secondary | ICD-10-CM

## 2020-08-10 DIAGNOSIS — G4700 Insomnia, unspecified: Secondary | ICD-10-CM

## 2020-08-10 DIAGNOSIS — Z23 Encounter for immunization: Secondary | ICD-10-CM

## 2020-08-10 DIAGNOSIS — Z0000 Encounter for general adult medical examination without abnormal findings: Secondary | ICD-10-CM | POA: Diagnosis not present

## 2020-08-10 DIAGNOSIS — E349 Endocrine disorder, unspecified: Secondary | ICD-10-CM

## 2020-08-10 DIAGNOSIS — E782 Mixed hyperlipidemia: Secondary | ICD-10-CM | POA: Diagnosis not present

## 2020-08-10 DIAGNOSIS — K648 Other hemorrhoids: Secondary | ICD-10-CM | POA: Insufficient documentation

## 2020-08-10 DIAGNOSIS — K760 Fatty (change of) liver, not elsewhere classified: Secondary | ICD-10-CM

## 2020-08-10 LAB — CBC WITH DIFFERENTIAL/PLATELET
Basophils Absolute: 0.1 10*3/uL (ref 0.0–0.1)
Basophils Relative: 0.7 % (ref 0.0–3.0)
Eosinophils Absolute: 0.3 10*3/uL (ref 0.0–0.7)
Eosinophils Relative: 3.2 % (ref 0.0–5.0)
HCT: 53.6 % — ABNORMAL HIGH (ref 39.0–52.0)
Hemoglobin: 17.5 g/dL — ABNORMAL HIGH (ref 13.0–17.0)
Lymphocytes Relative: 25 % (ref 12.0–46.0)
Lymphs Abs: 2.4 10*3/uL (ref 0.7–4.0)
MCHC: 32.6 g/dL (ref 30.0–36.0)
MCV: 87.9 fl (ref 78.0–100.0)
Monocytes Absolute: 1 10*3/uL (ref 0.1–1.0)
Monocytes Relative: 10.7 % (ref 3.0–12.0)
Neutro Abs: 5.9 10*3/uL (ref 1.4–7.7)
Neutrophils Relative %: 60.4 % (ref 43.0–77.0)
Platelets: 236 10*3/uL (ref 150.0–400.0)
RBC: 6.1 Mil/uL — ABNORMAL HIGH (ref 4.22–5.81)
RDW: 14.7 % (ref 11.5–15.5)
WBC: 9.8 10*3/uL (ref 4.0–10.5)

## 2020-08-10 LAB — LIPID PANEL
Cholesterol: 141 mg/dL (ref 0–200)
HDL: 51.6 mg/dL (ref >39.00)
LDL Cholesterol: 60 mg/dL (ref 0–99)
NonHDL: 89.26
Total CHOL/HDL Ratio: 3
Triglycerides: 144 mg/dL (ref 0.0–149.0)
VLDL: 28.8 mg/dL (ref 0.0–40.0)

## 2020-08-10 LAB — COMPREHENSIVE METABOLIC PANEL
ALT: 24 U/L (ref 0–53)
AST: 21 U/L (ref 0–37)
Albumin: 4.8 g/dL (ref 3.5–5.2)
Alkaline Phosphatase: 58 U/L (ref 39–117)
BUN: 17 mg/dL (ref 6–23)
CO2: 30 mEq/L (ref 19–32)
Calcium: 10.1 mg/dL (ref 8.4–10.5)
Chloride: 100 mEq/L (ref 96–112)
Creatinine, Ser: 1.15 mg/dL (ref 0.40–1.50)
GFR: 66.56 mL/min (ref >60.00)
Glucose, Bld: 101 mg/dL — ABNORMAL HIGH (ref 70–99)
Potassium: 4.9 mEq/L (ref 3.5–5.1)
Sodium: 135 mEq/L (ref 135–145)
Total Bilirubin: 1.3 mg/dL — ABNORMAL HIGH (ref 0.2–1.2)
Total Protein: 7 g/dL (ref 6.0–8.3)

## 2020-08-10 LAB — VITAMIN D 25 HYDROXY (VIT D DEFICIENCY, FRACTURES): VITD: 39.51 ng/mL (ref 30.00–100.00)

## 2020-08-10 LAB — TSH: TSH: 0.39 u[IU]/mL (ref 0.35–4.50)

## 2020-08-10 LAB — URIC ACID: Uric Acid, Serum: 6.8 mg/dL (ref 4.0–7.8)

## 2020-08-10 MED ORDER — CEFDINIR 300 MG PO CAPS: 300.0000 mg | ORAL_CAPSULE | Freq: Two times a day (BID) | ORAL | 0 refills | Status: DC

## 2020-08-10 MED FILL — CEFDINIR 300 MG CAPSULE: 300 | 10 days supply | Qty: 20 | Fill #0

## 2020-08-10 NOTE — Assessment & Plan Note (Signed)
Asymptomatic, encouraged high fiber diet with increased hydration

## 2020-08-10 NOTE — Assessment & Plan Note (Signed)
Encouraged good sleep hygiene such as dark, quiet room. No blue/green glowing lights such as computer screens in bedroom. No alcohol or stimulants in evening. Cut down on caffeine as able. Regular exercise is helpful but not just prior to bed time.  

## 2020-08-10 NOTE — Assessment & Plan Note (Signed)
Encouraged heart healthy diet, increase exercise, avoid trans fats, consider a krill oil cap daily. Tolerating Atorvastatin 

## 2020-08-10 NOTE — Assessment & Plan Note (Addendum)
Supplement and monitor, is following with Dr Thea Silversmith of Urology

## 2020-08-10 NOTE — Assessment & Plan Note (Signed)
Well controlled, no changes to meds. Encouraged heart healthy diet such as the DASH diet and exercise as tolerated.  °

## 2020-08-10 NOTE — Assessment & Plan Note (Signed)
Supplement and monitor 

## 2020-08-10 NOTE — Patient Instructions (Addendum)
Tdap is the tetanus shot you need.  Shingrix is the shingles shots 2 over 2-6 months  Preventive Care 66 Years and Older, Male Preventive care refers to lifestyle choices and visits with your health care provider that can promote health and wellness. This includes:  A yearly physical exam. This is also called an annual wellness visit.  Regular dental and eye exams.  Immunizations.  Screening for certain conditions.  Healthy lifestyle choices, such as: ? Eating a healthy diet. ? Getting regular exercise. ? Not using drugs or products that contain nicotine and tobacco. ? Limiting alcohol use. What can I expect for my preventive care visit? Physical exam Your health care provider will check your:  Height and weight. These may be used to calculate your BMI (body mass index). BMI is a measurement that tells if you are at a healthy weight.  Heart rate and blood pressure.  Body temperature.  Skin for abnormal spots. Counseling Your health care provider may ask you questions about your:  Past medical problems.  Family's medical history.  Alcohol, tobacco, and drug use.  Emotional well-being.  Home life and relationship well-being.  Sexual activity.  Diet, exercise, and sleep habits.  History of falls.  Memory and ability to understand (cognition).  Work and work Astronomer.  Access to firearms. What immunizations do I need? Vaccines are usually given at various ages, according to a schedule. Your health care provider will recommend vaccines for you based on your age, medical history, and lifestyle or other factors, such as travel or where you work.   What tests do I need? Blood tests  Lipid and cholesterol levels. These may be checked every 5 years, or more often depending on your overall health.  Hepatitis C test.  Hepatitis B test. Screening  Lung cancer screening. You may have this screening every year starting at age 66 if you have a 30-pack-year  history of smoking and currently smoke or have quit within the past 15 years.  Colorectal cancer screening. ? All adults should have this screening starting at age 66 and continuing until age 50. ? Your health care provider may recommend screening at age 66 if you are at increased risk. ? You will have tests every 1-10 years, depending on your results and the type of screening test.  Prostate cancer screening. Recommendations will vary depending on your family history and other risks.  Genital exam to check for testicular cancer or hernias.  Diabetes screening. ? This is done by checking your blood sugar (glucose) after you have not eaten for a while (fasting). ? You may have this done every 1-3 years.  Abdominal aortic aneurysm (AAA) screening. You may need this if you are a current or former smoker.  STD (sexually transmitted disease) testing, if you are at risk. Follow these instructions at home: Eating and drinking  Eat a diet that includes fresh fruits and vegetables, whole grains, lean protein, and low-fat dairy products. Limit your intake of foods with high amounts of sugar, saturated fats, and salt.  Take vitamin and mineral supplements as recommended by your health care provider.  Do not drink alcohol if your health care provider tells you not to drink.  If you drink alcohol: ? Limit how much you have to 0-2 drinks a day. ? Be aware of how much alcohol is in your drink. In the [place] one drink equals one 12 oz bottle of beer (355 mL), one 5 oz glass of wine (148 mL), or one  1 oz glass of hard liquor (44 mL).   Lifestyle  Take daily care of your teeth and gums. Brush your teeth every morning and night with fluoride toothpaste. Floss one time each day.  Stay active. Exercise for at least 30 minutes 5 or more days each week.  Do not use any products that contain nicotine or tobacco, such as cigarettes, e-cigarettes, and chewing tobacco. If you need help quitting, ask your  health care provider.  Do not use drugs.  If you are sexually active, practice safe sex. Use a condom or other form of protection to prevent STIs (sexually transmitted infections).  Talk with your health care provider about taking a low-dose aspirin or statin.  Find healthy ways to cope with stress, such as: ? Meditation, yoga, or listening to music. ? Journaling. ? Talking to a trusted person. ? Spending time with friends and family. Safety  Always wear your seat belt while driving or riding in a vehicle.  Do not drive: ? If you have been drinking alcohol. Do not ride with someone who has been drinking. ? When you are tired or distracted. ? While texting.  Wear a helmet and other protective equipment during sports activities.  If you have firearms in your house, make sure you follow all gun safety procedures. What's next?  Visit your health care provider once a year for an annual wellness visit.  Ask your health care provider how often you should have your eyes and teeth checked.  Stay up to date on all vaccines. This information is not intended to replace advice given to you by your health care provider. Make sure you discuss any questions you have with your health care provider. Document Revised: 04/13/2019 Document Reviewed: 07/09/2018 Elsevier Patient Education  2021 [employer].

## 2020-08-10 NOTE — Assessment & Plan Note (Addendum)
Patient encouraged to maintain heart healthy diet, regular exercise, adequate sleep. Consider daily probiotics. Take medications as prescribed. Colonoscopy 2018 repeat in 10 years.

## 2020-08-10 NOTE — Assessment & Plan Note (Signed)
Hydrate and monitor 

## 2020-08-10 NOTE — Progress Notes (Signed)
Subjective:    Patient ID: Taylor Mcdonald, male    DOB: 19-Mar-1956, 65 y.o.   MRN: [id number]  Chief Complaint  Patient presents with  . Annual Exam    HPI Patient is in today for annual preventative exam and follow up on chronic medical concerns. He is doing well and staying busy with his family and grandson. He does have a cut on his left thumb which is healing well. No recent febrile illness or hospitalizations. He is staying active and tries to maintain a heart healthy diet. He follows with urology for his hypogonadism. He has tolerated his COVID shots. Denies CP/palp/SOB/HA/congestion/fevers/GI or GU c/o. Taking meds as prescribed  Past Medical History:  Diagnosis Date  . Allergy   . Anxiety 03/04/2012  . Fatigue   . GERD (gastroesophageal reflux disease)   . Gout   . Hyperglycemia   . Hyperlipidemia   . Hypertension   . Insomnia   . Internal nasal lesion 07/04/2016  . Low testosterone   . Lower back pain 09/09/2012  . Migraine   . Pain of left heel 10/01/2011  . Preventative health care 07/24/2012  . RLS (restless legs syndrome)   . Sinusitis 03/04/2012  . Varicose veins of both lower extremities 12/12/2016  . Vasovagal near syncope 04/30/2013  . Vitamin D deficiency 12/23/2011    Past Surgical History:  Procedure Laterality Date  . LUMBAR LAMINECTOMY/DECOMPRESSION MICRODISCECTOMY N/A 05/13/2013   Procedure: LUMBAR LAMINECTOMY/DECOMPRESSION MICRODISCECTOMY 1 LEVEL L3-4;  Surgeon: Javier Docker, MD;  Location: [facility];  Service: Orthopedics;  Laterality: N/A;  . PANENDOSCOPY    . TONSILLECTOMY      Family History  Problem Relation Age of Onset  . Obesity Mother   . Hypertension Son   . Hypertension Maternal Grandmother   . Obesity Maternal Grandmother   . Cancer Maternal Grandmother 50       intestinal  . Dementia Maternal Grandfather   . Colon cancer Neg Hx   . Esophageal cancer Neg Hx   . Rectal cancer Neg Hx   . Stomach cancer Neg Hx     Social History    Socioeconomic History  . Marital status: Married    Spouse name: Not on file  . Number of children: Not on file  . Years of education: Not on file  . Highest education level: Not on file  Occupational History  . Not on file  Tobacco Use  . Smoking status: Never Smoker  . Smokeless tobacco: Never Used  Vaping Use  . Vaping Use: Never used  Substance and Sexual Activity  . Alcohol use: No  . Drug use: Not Currently    Types: Hydrocodone    Comment: Pt states he does not use anymore.  Marland Kitchen Sexual activity: Yes  Other Topics Concern  . Not on file  Social History Narrative  . Not on file   Social Determinants of Health   Financial Resource Strain: Not on file  Food Insecurity: Not on file  Transportation Needs: Not on file  Physical Activity: Not on file  Stress: Not on file  Social Connections: Not on file  Intimate Partner Violence: Not on file    Outpatient Medications Prior to Visit  Medication Sig Dispense Refill  . allopurinol (ZYLOPRIM) 100 MG tablet TAKE 2 TABLETS (200 MG TOTAL) BY MOUTH DAILY. 180 tablet 3  . anastrozole (ARIMIDEX) 1 MG tablet Take 0.5 tablets (0.5 mg total) by mouth daily. 45 tablet 3  . aspirin 81  MG tablet Take 1 tablet (81 mg total) by mouth daily. Resume 4 days post-op 30 tablet   . atorvastatin (LIPITOR) 10 MG tablet TAKE 1 TABLET BY MOUTH ONCE DAILY 90 tablet 1  . Fish Oil-Cholecalciferol (FISH OIL + D3) 1200-1000 MG-UNIT CAPS Take 1 capsule by mouth daily.    . fluticasone (FLONASE) 50 MCG/ACT nasal spray PLACE 2 SPRAYS INTO THE NOSE DAILY. 48 g 3  . lisinopril (ZESTRIL) 20 MG tablet Take 1 tablet (20 mg total) by mouth daily. 90 tablet 1  . omeprazole (PRILOSEC) 20 MG capsule Take 1 capsule (20 mg total) by mouth daily. 90 capsule 3  . OVER THE COUNTER MEDICATION 12 hr decongestant 120mg     . propranolol (INDERAL) 40 MG tablet Take 1 tablet (40 mg total) by mouth daily. 90 tablet 1  . sildenafil (REVATIO) 20 MG tablet Take 1-4 tablets  (20-80 mg total) by mouth daily as needed. 35 tablet 5  . tadalafil (CIALIS) 20 MG tablet Take 1 tablet (20 mg total) by mouth daily as needed. 10 tablet 5  . testosterone cypionate (DEPOTESTOSTERONE CYPIONATE) 200 MG/ML injection INJECT 1 ML UNDER THE SKIN EVERY 14 DAYS 10 mL 3  . zolpidem (AMBIEN) 10 MG tablet TAKE 1/2 TO 1 TABLET BY MOUTH AT BEDTIME AS NEEDED FOR SLEEP 30 tablet 5   No facility-administered medications prior to visit.    No Known Allergies  Review of Systems  Constitutional: Negative for chills, fever and malaise/fatigue.  HENT: Negative for congestion and hearing loss.   Eyes: Negative for discharge.  Respiratory: Negative for cough, sputum production and shortness of breath.   Cardiovascular: Negative for chest pain, palpitations and leg swelling.  Gastrointestinal: Negative for abdominal pain, blood in stool, constipation, diarrhea, heartburn, nausea and vomiting.  Genitourinary: Negative for dysuria, frequency, hematuria and urgency.  Musculoskeletal: Negative for back pain, falls and myalgias.  Skin: Negative for rash.  Neurological: Negative for dizziness, sensory change, loss of consciousness, weakness and headaches.  Endo/Heme/Allergies: Negative for environmental allergies. Does not bruise/bleed easily.  Psychiatric/Behavioral: Negative for depression and suicidal ideas. The patient is not nervous/anxious and does not have insomnia.        Objective:    Physical Exam Vitals and nursing note reviewed.  Constitutional:      General: He is not in acute distress.    Appearance: Normal appearance. He is well-developed and well-nourished. He is not ill-appearing.  HENT:     Head: Normocephalic and atraumatic.     Right Ear: External ear normal.     Left Ear: External ear normal.     Nose: Nose normal.  Eyes:     General:        Right eye: No discharge.        Left eye: No discharge.  Cardiovascular:     Rate and Rhythm: Normal rate and regular rhythm.      Heart sounds: No murmur heard.   Pulmonary:     Effort: Pulmonary effort is normal.     Breath sounds: Normal breath sounds.  Abdominal:     General: Bowel sounds are normal.     Palpations: Abdomen is soft.     Tenderness: There is no abdominal tenderness.  Musculoskeletal:        General: No edema.     Cervical back: Normal range of motion and neck supple.  Skin:    General: Skin is warm and dry.  Neurological:     Mental Status: He is  alert and oriented to person, place, and time.  Psychiatric:        Mood and Affect: Mood and affect normal.     There were no vitals taken for this visit. Wt Readings from Last 3 Encounters:  05/05/20 202 lb (91.6 kg)  02/08/20 202 lb (91.6 kg)  08/06/19 203 lb (92.1 kg)    Diabetic Foot Exam - Simple   No data filed    Lab Results  Component Value Date   WBC 9.8 08/10/2020   HGB 17.5 (H) 08/10/2020   HCT 53.6 (H) 08/10/2020   PLT 236.0 08/10/2020   GLUCOSE 101 (H) 08/10/2020   CHOL 141 08/10/2020   TRIG 144.0 08/10/2020   HDL 51.60 08/10/2020   LDLDIRECT 125.7 02/10/2013   LDLCALC 60 08/10/2020   ALT 24 08/10/2020   AST 21 08/10/2020   NA 135 08/10/2020   K 4.9 08/10/2020   CL 100 08/10/2020   CREATININE 1.15 08/10/2020   BUN 17 08/10/2020   CO2 30 08/10/2020   TSH 0.39 08/10/2020   PSA 0.64 12/25/2017   HGBA1C 5.4 02/08/2020   MICROALBUR <0.7 12/25/2017    Lab Results  Component Value Date   TSH 0.39 08/10/2020   Lab Results  Component Value Date   WBC 9.8 08/10/2020   HGB 17.5 (H) 08/10/2020   HCT 53.6 (H) 08/10/2020   MCV 87.9 08/10/2020   PLT 236.0 08/10/2020   Lab Results  Component Value Date   NA 135 08/10/2020   K 4.9 08/10/2020   CO2 30 08/10/2020   GLUCOSE 101 (H) 08/10/2020   BUN 17 08/10/2020   CREATININE 1.15 08/10/2020   BILITOT 1.3 (H) 08/10/2020   ALKPHOS 58 08/10/2020   AST 21 08/10/2020   ALT 24 08/10/2020   PROT 7.0 08/10/2020   ALBUMIN 4.8 08/10/2020   CALCIUM 10.1  08/10/2020   GFR 66.56 08/10/2020   Lab Results  Component Value Date   CHOL 141 08/10/2020   Lab Results  Component Value Date   HDL 51.60 08/10/2020   Lab Results  Component Value Date   LDLCALC 60 08/10/2020   Lab Results  Component Value Date   TRIG 144.0 08/10/2020   Lab Results  Component Value Date   CHOLHDL 3 08/10/2020   Lab Results  Component Value Date   HGBA1C 5.4 02/08/2020       Assessment & Plan:   Problem List Items Addressed This Visit    Gout    Hydrate and monitor      Relevant Orders   Uric acid (Completed)   Hypertension    Well controlled, no changes to meds. Encouraged heart healthy diet such as the DASH diet and exercise as tolerated.       Relevant Orders   CBC with Differential/Platelet (Completed)   Comprehensive metabolic panel (Completed)   TSH (Completed)   Testosterone deficiency    Supplement and monitor, is following with Dr Thea Silversmith of Urology      Insomnia    Encouraged good sleep hygiene such as dark, quiet room. No blue/green glowing lights such as computer screens in bedroom. No alcohol or stimulants in evening. Cut down on caffeine as able. Regular exercise is helpful but not just prior to bed time.       Hyperlipidemia    Encouraged heart healthy diet, increase exercise, avoid trans fats, consider a krill oil cap daily. Tolerating Atorvastatin      Relevant Orders   Lipid panel (Completed)  Hyperglycemia    hgba1c acceptable, minimize simple carbs. Increase exercise as tolerated.       Relevant Orders   Comprehensive metabolic panel (Completed)   Vitamin D deficiency    Supplement and monitor      Relevant Orders   VITAMIN D 25 Hydroxy (Vit-D Deficiency, Fractures) (Completed)   Preventative health care - Primary    Patient encouraged to maintain heart healthy diet, regular exercise, adequate sleep. Consider daily probiotics. Take medications as prescribed. Colonoscopy 2018 repeat in 10 years.        Internal hemorrhoid    Asymptomatic, colonoscopy in 2018      Relevant Orders   VITAMIN D 25 Hydroxy (Vit-D Deficiency, Fractures) (Completed)   Diverticulosis    Asymptomatic, encouraged high fiber diet with increased hydration      Fatty liver   Relevant Orders   US Abdomen Complete   Renal cyst    repeat abdominal ultrasound for surveillance.       Relevant Orders   US Abdomen Complete   Finger laceration, initial encounter    Left thumb, 1 cm, healing well, no surrounding erythema. Given Tdap today       Other Visit Diagnoses    Need for Tdap vaccination       Relevant Orders   Tdap vaccine greater than or equal to 7yo IM (Completed)   Need for influenza vaccination       Relevant Orders   Flu Vaccine QUAD High Dose(Fluad) (Completed)      I am having Selena LesserSpencer W. Burtt start on cefdinir. I am also having him maintain his Fish Oil + D3, aspirin, OVER THE COUNTER MEDICATION, sildenafil, fluticasone, atorvastatin, propranolol, omeprazole, lisinopril, testosterone cypionate, tadalafil, anastrozole, zolpidem, and allopurinol.  Meds ordered this encounter  Medications  . cefdinir (OMNICEF) 300 MG capsule    Sig: Take 1 capsule (300 mg total) by mouth 2 (two) times daily for 10 days.    Dispense:  20 capsule    Refill:  0     Danise EdgeStacey Nakisha Chai, MD

## 2020-08-10 NOTE — Assessment & Plan Note (Signed)
Asymptomatic, colonoscopy in 2018

## 2020-08-10 NOTE — Assessment & Plan Note (Signed)
hgba1c acceptable, minimize simple carbs. Increase exercise as tolerated.  

## 2020-08-11 ENCOUNTER — Other Ambulatory Visit: Payer: Self-pay | Admitting: *Deleted

## 2020-08-11 DIAGNOSIS — D582 Other hemoglobinopathies: Secondary | ICD-10-CM

## 2020-08-12 ENCOUNTER — Other Ambulatory Visit: Payer: Medicare PPO

## 2020-08-12 DIAGNOSIS — Z20822 Contact with and (suspected) exposure to covid-19: Secondary | ICD-10-CM

## 2020-08-13 DIAGNOSIS — N281 Cyst of kidney, acquired: Secondary | ICD-10-CM | POA: Insufficient documentation

## 2020-08-13 DIAGNOSIS — K760 Fatty (change of) liver, not elsewhere classified: Secondary | ICD-10-CM | POA: Insufficient documentation

## 2020-08-13 DIAGNOSIS — S61219A Laceration without foreign body of unspecified finger without damage to nail, initial encounter: Secondary | ICD-10-CM | POA: Insufficient documentation

## 2020-08-13 NOTE — Assessment & Plan Note (Signed)
Left thumb, 1 cm, healing well, no surrounding erythema. Given Tdap today

## 2020-08-13 NOTE — Assessment & Plan Note (Signed)
repeat abdominal ultrasound for surveillance.

## 2020-08-15 LAB — NOVEL CORONAVIRUS, NAA: SARS-CoV-2, NAA: NOT DETECTED

## 2020-08-21 ENCOUNTER — Other Ambulatory Visit: Payer: Self-pay

## 2020-08-21 ENCOUNTER — Other Ambulatory Visit: Payer: Self-pay | Admitting: Family Medicine

## 2020-08-21 ENCOUNTER — Ambulatory Visit (HOSPITAL_BASED_OUTPATIENT_CLINIC_OR_DEPARTMENT_OTHER)
Admission: RE | Admit: 2020-08-21 | Discharge: 2020-08-21 | Disposition: A | Discharge status: Home / Self Care | Payer: Medicare PPO | Source: Ambulatory Visit | Attending: Family Medicine | Admitting: Family Medicine

## 2020-08-21 DIAGNOSIS — K760 Fatty (change of) liver, not elsewhere classified: Secondary | ICD-10-CM

## 2020-08-21 DIAGNOSIS — N281 Cyst of kidney, acquired: Secondary | ICD-10-CM

## 2020-09-04 MED FILL — ZOLPIDEM TARTRATE 10 MG TAB: 10 | 30 days supply | Qty: 30 | Fill #2

## 2020-09-04 MED FILL — TESTOSTERONE CYP 200 MG/ML: 200 | 70 days supply | Qty: 5 | Fill #1

## 2020-09-07 ENCOUNTER — Encounter: Payer: Self-pay | Admitting: Gastroenterology

## 2020-09-07 ENCOUNTER — Ambulatory Visit (INDEPENDENT_AMBULATORY_CARE_PROVIDER_SITE_OTHER): Payer: Medicare PPO | Admitting: Gastroenterology

## 2020-09-07 VITALS — BP 130/84 | HR 77 | Ht 73.0 in | Wt 195.0 lb

## 2020-09-07 DIAGNOSIS — K760 Fatty (change of) liver, not elsewhere classified: Secondary | ICD-10-CM | POA: Diagnosis not present

## 2020-09-07 DIAGNOSIS — K2270 Barrett's esophagus without dysplasia: Secondary | ICD-10-CM

## 2020-09-07 NOTE — Progress Notes (Signed)
    History of Present Illness: This is a 65 year old male here for fatty liver.  Abdominal ultrasound below ordered by his PCP.  Recent LFTs were normal except for a t bili of 1.3. Several sets of LFTs normal over the years. No GI complaints except occasional reflux symptoms.   Abd [place] 08/21/2020 IMPRESSION: 1. Echogenic liver compatible with steatosis. 2. Bilateral renal cysts  Current Medications, Allergies, Past Medical History, Past Surgical History, Family History and Social History were reviewed in [employer] record.   Physical Exam: General: Well developed, well nourished, no acute distress Head: Normocephalic and atraumatic Eyes: Sclerae anicteric, EOMI Ears: Normal auditory acuity Mouth: Not examined, mask on during Covid-19 pandemic Lungs: Clear throughout to auscultation Heart: Regular rate and rhythm; no murmurs, rubs or bruits Abdomen: Soft, non tender and non distended. No masses, hepatosplenomegaly or hernias noted. Normal Bowel sounds Rectal: Not done Musculoskeletal: Symmetrical with no gross deformities  Pulses:  Normal pulses noted Extremities: No clubbing, cyanosis, edema or deformities noted Neurological: Alert oriented x 4, grossly nonfocal Psychological:  Alert and cooperative. Normal mood and affect   Assessment and Recommendations:  1. Hepatic steatosis. LFTs normal. BMI normal. Carb modified, fat modified diet long term and maintain a normal BMI. Avoid heavy alcohol consumption. LFTs every 6 months with his PCP. Return to GI if LFTs persistently abnormal.    2. GERD with short segment Barrett's without dysplasia. He is taking omeprazole 20 mg qd prn. He is advised to take omeprazole every day (not prn) due to Barrett's. EGD 07/2022.

## 2020-09-07 NOTE — Patient Instructions (Signed)
You have been given info on hepatic steatosis and low carb/low fat diet to follow.   Please take your omeprazole daily and not as needed.   Thank you for choosing me and [facility].  Venita Lick. Pleas Koch., MD., Clementeen Graham

## 2020-09-15 ENCOUNTER — Other Ambulatory Visit: Payer: Self-pay | Admitting: Family Medicine

## 2020-09-15 MED FILL — ATORVASTATIN CALCIUM 10 MG: 10 | 90 days supply | Qty: 90 | Fill #0

## 2020-09-19 MED FILL — ANASTROZOLE 1 MG TABLET: 1 | 90 days supply | Qty: 45 | Fill #3

## 2020-10-09 ENCOUNTER — Other Ambulatory Visit: Payer: Medicare PPO

## 2020-10-11 ENCOUNTER — Other Ambulatory Visit (INDEPENDENT_AMBULATORY_CARE_PROVIDER_SITE_OTHER): Payer: Medicare PPO

## 2020-10-11 ENCOUNTER — Other Ambulatory Visit: Payer: Self-pay

## 2020-10-11 DIAGNOSIS — D582 Other hemoglobinopathies: Secondary | ICD-10-CM | POA: Diagnosis not present

## 2020-10-11 LAB — CBC
HCT: 49.4 % (ref 39.0–52.0)
Hemoglobin: 16.6 g/dL (ref 13.0–17.0)
MCHC: 33.7 g/dL (ref 30.0–36.0)
MCV: 85.8 fl (ref 78.0–100.0)
Platelets: 205 10*3/uL (ref 150.0–400.0)
RBC: 5.76 Mil/uL (ref 4.22–5.81)
RDW: 15.3 % (ref 11.5–15.5)
WBC: 6.3 10*3/uL (ref 4.0–10.5)

## 2020-10-18 ENCOUNTER — Other Ambulatory Visit: Payer: Self-pay | Admitting: Family Medicine

## 2020-10-18 MED FILL — FLUTICASONE PROP 50 MCG SPR: 50 | 90 days supply | Qty: 48 | Fill #0

## 2020-11-03 ENCOUNTER — Ambulatory Visit: Payer: Medicare PPO | Admitting: Urology

## 2020-11-06 ENCOUNTER — Other Ambulatory Visit: Payer: Self-pay | Admitting: Family Medicine

## 2020-11-06 ENCOUNTER — Other Ambulatory Visit (HOSPITAL_BASED_OUTPATIENT_CLINIC_OR_DEPARTMENT_OTHER): Payer: Self-pay

## 2020-11-06 DIAGNOSIS — G4700 Insomnia, unspecified: Secondary | ICD-10-CM

## 2020-11-06 MED ORDER — ZOLPIDEM TARTRATE 10 MG PO TABS
ORAL_TABLET | Freq: Every evening | ORAL | 5 refills | Status: DC | PRN
  Filled 2020-11-06: qty 30, 30d supply, fill #0
  Filled 2021-01-05: qty 30, 30d supply, fill #1
  Filled 2021-03-15: qty 30, 30d supply, fill #2

## 2020-11-06 MED ORDER — PROPRANOLOL HCL 40 MG PO TABS
ORAL_TABLET | Freq: Every day | ORAL | 1 refills | Status: DC
  Filled 2020-11-06: qty 90, 90d supply, fill #0
  Filled 2021-02-12: qty 90, 90d supply, fill #1

## 2020-11-06 NOTE — Telephone Encounter (Signed)
Requesting: zolpidem 10mg  Contract: 06/30/18 UDS:12/25/17 Last Visit: 08/10/20 Next Visit: 02/08/21 Last Refill: 05/09/20  Please Advise

## 2020-11-07 ENCOUNTER — Other Ambulatory Visit: Payer: Self-pay

## 2020-11-07 ENCOUNTER — Other Ambulatory Visit: Payer: Medicare PPO

## 2020-11-07 DIAGNOSIS — E291 Testicular hypofunction: Secondary | ICD-10-CM | POA: Diagnosis not present

## 2020-11-09 LAB — COMPREHENSIVE METABOLIC PANEL
ALT: 30 IU/L (ref 0–44)
AST: 24 IU/L (ref 0–40)
Albumin/Globulin Ratio: 2 (ref 1.2–2.2)
Albumin: 4.4 g/dL (ref 3.8–4.8)
Alkaline Phosphatase: 63 IU/L (ref 44–121)
BUN/Creatinine Ratio: 13 (ref 10–24)
BUN: 14 mg/dL (ref 8–27)
Bilirubin Total: 0.7 mg/dL (ref 0.0–1.2)
CO2: 23 mmol/L (ref 20–29)
Calcium: 9.6 mg/dL (ref 8.6–10.2)
Chloride: 101 mmol/L (ref 96–106)
Creatinine, Ser: 1.12 mg/dL (ref 0.76–1.27)
Globulin, Total: 2.2 g/dL (ref 1.5–4.5)
Glucose: 99 mg/dL (ref 65–99)
Potassium: 4.5 mmol/L (ref 3.5–5.2)
Sodium: 139 mmol/L (ref 134–144)
Total Protein: 6.6 g/dL (ref 6.0–8.5)
eGFR: 72 mL/min/{1.73_m2} (ref >59)

## 2020-11-09 LAB — TESTOSTERONE,FREE AND TOTAL
Testosterone, Free: 12.6 pg/mL (ref 6.6–18.1)
Testosterone: 737 ng/dL (ref 264–916)

## 2020-11-09 LAB — ESTRADIOL: Estradiol: 5.8 pg/mL — ABNORMAL LOW (ref 7.6–42.6)

## 2020-11-09 LAB — PSA: Prostate Specific Ag, Serum: 1.3 ng/mL (ref 0.0–4.0)

## 2020-11-14 ENCOUNTER — Ambulatory Visit (INDEPENDENT_AMBULATORY_CARE_PROVIDER_SITE_OTHER): Payer: Medicare PPO | Admitting: Urology

## 2020-11-14 ENCOUNTER — Other Ambulatory Visit: Payer: Self-pay

## 2020-11-14 ENCOUNTER — Encounter: Payer: Self-pay | Admitting: Urology

## 2020-11-14 ENCOUNTER — Other Ambulatory Visit (HOSPITAL_BASED_OUTPATIENT_CLINIC_OR_DEPARTMENT_OTHER): Payer: Self-pay

## 2020-11-14 VITALS — BP 120/78 | HR 80 | Temp 98.1°F | Ht 74.0 in | Wt 194.0 lb

## 2020-11-14 DIAGNOSIS — N5201 Erectile dysfunction due to arterial insufficiency: Secondary | ICD-10-CM | POA: Diagnosis not present

## 2020-11-14 DIAGNOSIS — E291 Testicular hypofunction: Secondary | ICD-10-CM | POA: Diagnosis not present

## 2020-11-14 MED ORDER — TESTOSTERONE CYPIONATE 200 MG/ML IM SOLN
INTRAMUSCULAR | 3 refills | Status: DC
  Filled 2020-11-14: qty 5, 70d supply, fill #0
  Filled 2021-01-20: qty 5, 70d supply, fill #1
  Filled 2021-04-14: qty 5, 70d supply, fill #0

## 2020-11-14 MED ORDER — ANASTROZOLE 1 MG PO TABS
0.5000 mg | ORAL_TABLET | Freq: Every day | ORAL | 3 refills | Status: DC
  Filled 2020-11-14 – 2020-12-22 (×2): qty 45, 90d supply, fill #0
  Filled 2021-03-19: qty 45, 90d supply, fill #1

## 2020-11-14 MED ORDER — SILDENAFIL CITRATE 20 MG PO TABS: 20.0000 mg | ORAL_TABLET | Freq: Every day | ORAL | 5 refills | Status: DC | PRN

## 2020-11-14 NOTE — Progress Notes (Signed)

## 2020-11-14 NOTE — Progress Notes (Signed)
11/14/2020 1:53 PM   Taylor Mcdonald 12-17-1955 [id number]  Referring provider: Bradd Canary, MD [id number] [place],  [place] [address]  Followup hypogonadism and Erectile dysfunction  HPI: [name] is a 12YQ here for followup for hypogonadism and erectile dysfunction. Testosterone 688, estradiol 5.8, hgb 16.6. PSA normal. Good energy, good libido. He injects 200mg  testosterone every 2 weeks. He uses sildenafil prn for his erectile dysfunction which works well.    PMH: Past Medical History:  Diagnosis Date  . Allergy   . Anxiety 03/04/2012  . Fatigue   . GERD (gastroesophageal reflux disease)   . Gout   . Hyperglycemia   . Hyperlipidemia   . Hypertension   . Insomnia   . Internal nasal lesion 07/04/2016  . Low testosterone   . Lower back pain 09/09/2012  . Migraine   . Pain of left heel 10/01/2011  . Preventative health care 07/24/2012  . RLS (restless legs syndrome)   . Sinusitis 03/04/2012  . Varicose veins of both lower extremities 12/12/2016  . Vasovagal near syncope 04/30/2013  . Vitamin D deficiency 12/23/2011    Surgical History: Past Surgical History:  Procedure Laterality Date  . LUMBAR LAMINECTOMY/DECOMPRESSION MICRODISCECTOMY N/A 05/13/2013   Procedure: LUMBAR LAMINECTOMY/DECOMPRESSION MICRODISCECTOMY 1 LEVEL L3-4;  Surgeon: 05/15/2013, MD;  Location: [facility];  Service: Orthopedics;  Laterality: N/A;  . PANENDOSCOPY    . TONSILLECTOMY      Home Medications:  Allergies as of 11/14/2020   Not on File     Medication List       Accurate as of November 14, 2020  1:53 PM. If you have any questions, ask your nurse or doctor.        allopurinol 100 MG tablet Commonly known as: ZYLOPRIM TAKE 2 TABLETS (200 MG TOTAL) BY MOUTH DAILY.   anastrozole 1 MG tablet Commonly known as: ARIMIDEX TAKE 1/2 TABLET BY MOUTH EVERYDAY   anastrozole 1 MG tablet Commonly known as: ARIMIDEX Take 0.5 tablets (0.5 mg total) by mouth daily.   aspirin 81  MG tablet Take 1 tablet (81 mg total) by mouth daily. Resume 4 days post-op   atorvastatin 10 MG tablet Commonly known as: LIPITOR TAKE 1 TABLET BY MOUTH ONCE DAILY   cefdinir 300 MG capsule Commonly known as: OMNICEF TAKE 1 CAPSULE BY MOUTH TWICE DAILY FOR 10 DAYS   Fish Oil + D3 1200-1000 MG-UNIT Caps Take 1 capsule by mouth daily.   fluticasone 50 MCG/ACT nasal spray Commonly known as: FLONASE PLACE 2 SPRAYS INTO THE NOSE DAILY.   lisinopril 20 MG tablet Commonly known as: ZESTRIL TAKE 1 TABLET (20 MG TOTAL) BY MOUTH DAILY.   omeprazole 20 MG capsule Commonly known as: PRILOSEC Take 1 capsule (20 mg total) by mouth daily.   OVER THE COUNTER MEDICATION 12 hr decongestant 120mg    propranolol 40 MG tablet Commonly known as: INDERAL TAKE 1 TABLET (40 MG TOTAL) BY MOUTH DAILY.   sildenafil 20 MG tablet Commonly known as: Revatio Take 1-4 tablets (20-80 mg total) by mouth daily as needed.   testosterone cypionate 200 MG/ML injection Commonly known as: DEPOTESTOSTERONE CYPIONATE INJECT 1 ML UNDER THE SKIN EVERY 14 DAYS   zolpidem 10 MG tablet Commonly known as: AMBIEN TAKE 1/2 TO 1 TABLET BY MOUTH AT BEDTIME AS NEEDED FOR SLEEP       Allergies: Not on File  Family History: Family History  Problem Relation Age of Onset  . Obesity  Mother   . Hypertension Son   . Hypertension Maternal Grandmother   . Obesity Maternal Grandmother   . Cancer Maternal Grandmother 50       intestinal  . Dementia Maternal Grandfather   . Colon cancer Neg Hx   . Esophageal cancer Neg Hx   . Rectal cancer Neg Hx   . Stomach cancer Neg Hx     Social History:  reports that he has never smoked. He has never used smokeless tobacco. He reports previous drug use. Drug: Hydrocodone. He reports that he does not drink alcohol.  ROS: All other review of systems were reviewed and are negative except what is noted above in HPI  Physical Exam: BP 120/78   Pulse 80   Temp 98.1 F (36.7  C)   Ht 6\' 2"  (1.88 m)   Wt 194 lb (88 kg)   BMI 24.91 kg/m   Constitutional:  Alert and oriented, No acute distress. HEENT: [place] AT, moist mucus membranes.  Trachea midline, no masses. Cardiovascular: No clubbing, cyanosis, or edema. Respiratory: Normal respiratory effort, no increased work of breathing. GI: Abdomen is soft, nontender, nondistended, no abdominal masses GU: No CVA tenderness.  Lymph: No cervical or inguinal lymphadenopathy. Skin: No rashes, bruises or suspicious lesions. Neurologic: Grossly intact, no focal deficits, moving all 4 extremities. Psychiatric: Normal mood and affect.  Laboratory Data: Lab Results  Component Value Date   WBC 6.3 10/11/2020   HGB 16.6 10/11/2020   HCT 49.4 10/11/2020   MCV 85.8 10/11/2020   PLT 205.0 10/11/2020    Lab Results  Component Value Date   CREATININE 1.12 11/07/2020    Lab Results  Component Value Date   PSA 0.64 12/25/2017   PSA 0.63 11/23/2015   PSA 0.50 08/25/2014    Lab Results  Component Value Date   TESTOSTERONE 737 11/07/2020    Lab Results  Component Value Date   HGBA1C 5.4 02/08/2020    Urinalysis    Component Value Date/Time   COLORURINE YELLOW 12/25/2017 1037   APPEARANCEUR Clear 05/05/2020 1042   LABSPEC 1.010 12/25/2017 1037   PHURINE 6.0 12/25/2017 1037   GLUCOSEU Negative 05/05/2020 1042   GLUCOSEU NEGATIVE 12/25/2017 1037   HGBUR NEGATIVE 12/25/2017 1037   HGBUR trace-intact 07/25/2009 0000   BILIRUBINUR Negative 05/05/2020 1042   KETONESUR NEGATIVE 12/25/2017 1037   PROTEINUR Negative 05/05/2020 1042   UROBILINOGEN 0.2 12/25/2017 1037   NITRITE Negative 05/05/2020 1042   NITRITE NEGATIVE 12/25/2017 1037   LEUKOCYTESUR Negative 05/05/2020 1042    Lab Results  Component Value Date   LABMICR Comment 05/05/2020    Pertinent Imaging:  No results found for this or any previous visit.  No results found for this or any previous visit.  No results found for this or any previous  visit.  No results found for this or any previous visit.  No results found for this or any previous visit.  No results found for this or any previous visit.  No results found for this or any previous visit.  No results found for this or any previous visit.   Assessment & Plan:    1. Hypogonadism in male -testosterone labs in 6 months. Continue IM testosterone 200mg  every 2 weeks - Urinalysis, Routine w reflex microscopic  2. Erectile dysfunction due to arterial insufficiency Continue sildenafil   No follow-ups on file.  07/05/2020, MD  [facility]

## 2020-11-14 NOTE — Patient Instructions (Signed)
Testosterone injection What is this medicine? TESTOSTERONE (tes TOS ter one) is the main male hormone. It supports normal male development such as muscle growth, facial hair, and deep voice. It is used in males to treat low testosterone levels. This medicine may be used for other purposes; ask your health care provider or pharmacist if you have questions. COMMON BRAND NAME(S): Andro-L.A., Aveed, Delatestryl, Depo-Testosterone, Virilon What should I tell my health care provider before I take this medicine? They need to know if you have any of these conditions:  cancer  diabetes  heart disease  kidney disease  liver disease  lung disease  prostate disease  an unusual or allergic reaction to testosterone, other medicines, foods, dyes, or preservatives  pregnant or trying to get pregnant  breast-feeding How should I use this medicine? This medicine is for injection into a muscle. It is usually given by a health care professional in a hospital or clinic setting. Contact your pediatrician regarding the use of this medicine in children. While this medicine may be prescribed for children as young as 12 years of age for selected conditions, precautions do apply. Overdosage: If you think you have taken too much of this medicine contact a poison control center or emergency room at once. NOTE: This medicine is only for you. Do not share this medicine with others. What if I miss a dose? Try not to miss a dose. Your doctor or health care professional will tell you when your next injection is due. Notify the office if you are unable to keep an appointment. What may interact with this medicine?  medicines for diabetes  medicines that treat or prevent blood clots like warfarin  oxyphenbutazone  propranolol  steroid medicines like prednisone or cortisone This list may not describe all possible interactions. Give your health care provider a list of all the medicines, herbs, non-prescription  drugs, or dietary supplements you use. Also tell them if you smoke, drink alcohol, or use illegal drugs. Some items may interact with your medicine. What should I watch for while using this medicine? Visit your doctor or health care professional for regular checks on your progress. They will need to check the level of testosterone in your blood. This medicine is only approved for use in men who have low levels of testosterone related to certain medical conditions. Heart attacks and strokes have been reported with the use of this medicine. Notify your doctor or health care professional and seek emergency treatment if you develop breathing problems; changes in vision; confusion; chest pain or chest tightness; sudden arm pain; severe, sudden headache; trouble speaking or understanding; sudden numbness or weakness of the face, arm or leg; loss of balance or coordination. Talk to your doctor about the risks and benefits of this medicine. This medicine may affect blood sugar levels. If you have diabetes, check with your doctor or health care professional before you change your diet or the dose of your diabetic medicine. Testosterone injections are not commonly used in women. Women should inform their doctor if they wish to become pregnant or think they might be pregnant. There is a potential for serious side effects to an unborn child. Talk to your health care professional or pharmacist for more information. Talk with your doctor or health care professional about your birth control options while taking this medicine. This drug is banned from use in athletes by most athletic organizations. What side effects may I notice from receiving this medicine? Side effects that you should report to   your doctor or health care professional as soon as possible:  allergic reactions like skin rash, itching or hives, swelling of the face, lips, or tongue  breast enlargement  breathing problems  changes in emotions or  moods  deep or hoarse voice  irregular menstrual periods  signs and symptoms of liver injury like dark yellow or brown urine; general ill feeling or flu-like symptoms; light-colored stools; loss of appetite; nausea; right upper belly pain; unusually weak or tired; yellowing of the eyes or skin  stomach pain  swelling of the ankles, feet, hands  too frequent or persistent erections  trouble passing urine or change in the amount of urine Side effects that usually do not require medical attention (report to your doctor or health care professional if they continue or are bothersome):  acne  change in sex drive or performance  facial hair growth  hair loss  headache This list may not describe all possible side effects. Call your doctor for medical advice about side effects. You may report side effects to FDA at 1-800-FDA-1088. Where should I keep my medicine? Keep out of the reach of children. This medicine can be abused. Keep your medicine in a safe place to protect it from theft. Do not share this medicine with anyone. Selling or giving away this medicine is dangerous and against the law. Store at room temperature between 20 and 25 degrees C (68 and 77 degrees F). Do not freeze. Protect from light. Follow the directions for the product you are prescribed. Throw away any unused medicine after the expiration date. NOTE: This sheet is a summary. It may not cover all possible information. If you have questions about this medicine, talk to your doctor, pharmacist, or health care provider.  2021 Elsevier/Gold Standard (2015-08-19 07:33:55)  

## 2020-11-15 ENCOUNTER — Other Ambulatory Visit (HOSPITAL_BASED_OUTPATIENT_CLINIC_OR_DEPARTMENT_OTHER): Payer: Self-pay

## 2020-11-15 LAB — URINALYSIS, ROUTINE W REFLEX MICROSCOPIC
Bilirubin, UA: NEGATIVE
Glucose, UA: NEGATIVE
Ketones, UA: NEGATIVE
Leukocytes,UA: NEGATIVE
Nitrite, UA: NEGATIVE
Protein,UA: NEGATIVE
RBC, UA: NEGATIVE
Specific Gravity, UA: 1.015 (ref 1.005–1.030)
Urobilinogen, Ur: 0.2 mg/dL (ref 0.2–1.0)
pH, UA: 7 (ref 5.0–7.5)

## 2020-12-04 ENCOUNTER — Other Ambulatory Visit: Payer: Self-pay | Admitting: Family Medicine

## 2020-12-04 ENCOUNTER — Other Ambulatory Visit (HOSPITAL_BASED_OUTPATIENT_CLINIC_OR_DEPARTMENT_OTHER): Payer: Self-pay

## 2020-12-04 MED ORDER — LISINOPRIL 20 MG PO TABS
ORAL_TABLET | Freq: Every day | ORAL | 1 refills | Status: DC
  Filled 2020-12-04: qty 90, 90d supply, fill #0
  Filled 2021-03-12: qty 90, 90d supply, fill #1

## 2020-12-04 MED FILL — Omeprazole Cap Delayed Release 20 MG: ORAL | 90 days supply | Qty: 90 | Fill #0 | Status: AC

## 2020-12-13 ENCOUNTER — Other Ambulatory Visit (HOSPITAL_BASED_OUTPATIENT_CLINIC_OR_DEPARTMENT_OTHER): Payer: Self-pay

## 2020-12-13 MED FILL — Atorvastatin Calcium Tab 10 MG (Base Equivalent): ORAL | 90 days supply | Qty: 90 | Fill #0 | Status: AC

## 2020-12-20 ENCOUNTER — Other Ambulatory Visit (HOSPITAL_BASED_OUTPATIENT_CLINIC_OR_DEPARTMENT_OTHER): Payer: Self-pay

## 2020-12-20 ENCOUNTER — Other Ambulatory Visit: Payer: Self-pay | Admitting: Urology

## 2020-12-22 ENCOUNTER — Other Ambulatory Visit (HOSPITAL_BASED_OUTPATIENT_CLINIC_OR_DEPARTMENT_OTHER): Payer: Self-pay

## 2020-12-26 ENCOUNTER — Other Ambulatory Visit (HOSPITAL_BASED_OUTPATIENT_CLINIC_OR_DEPARTMENT_OTHER): Payer: Self-pay

## 2021-01-05 ENCOUNTER — Other Ambulatory Visit (HOSPITAL_BASED_OUTPATIENT_CLINIC_OR_DEPARTMENT_OTHER): Payer: Self-pay

## 2021-01-12 ENCOUNTER — Other Ambulatory Visit (HOSPITAL_BASED_OUTPATIENT_CLINIC_OR_DEPARTMENT_OTHER): Payer: Self-pay

## 2021-01-15 ENCOUNTER — Other Ambulatory Visit (HOSPITAL_BASED_OUTPATIENT_CLINIC_OR_DEPARTMENT_OTHER): Payer: Self-pay

## 2021-01-22 ENCOUNTER — Other Ambulatory Visit (HOSPITAL_BASED_OUTPATIENT_CLINIC_OR_DEPARTMENT_OTHER): Payer: Self-pay

## 2021-01-24 ENCOUNTER — Other Ambulatory Visit (HOSPITAL_BASED_OUTPATIENT_CLINIC_OR_DEPARTMENT_OTHER): Payer: Self-pay

## 2021-01-24 MED FILL — Allopurinol Tab 100 MG: ORAL | 90 days supply | Qty: 180 | Fill #0 | Status: AC

## 2021-02-08 ENCOUNTER — Other Ambulatory Visit: Payer: Self-pay

## 2021-02-08 ENCOUNTER — Ambulatory Visit (INDEPENDENT_AMBULATORY_CARE_PROVIDER_SITE_OTHER): Payer: Medicare PPO | Admitting: Family Medicine

## 2021-02-08 VITALS — BP 132/84 | HR 66 | Temp 97.6°F | Resp 16 | Wt 191.4 lb

## 2021-02-08 DIAGNOSIS — M1A9XX0 Chronic gout, unspecified, without tophus (tophi): Secondary | ICD-10-CM | POA: Diagnosis not present

## 2021-02-08 DIAGNOSIS — G4700 Insomnia, unspecified: Secondary | ICD-10-CM

## 2021-02-08 DIAGNOSIS — R739 Hyperglycemia, unspecified: Secondary | ICD-10-CM

## 2021-02-08 DIAGNOSIS — E291 Testicular hypofunction: Secondary | ICD-10-CM | POA: Diagnosis not present

## 2021-02-08 DIAGNOSIS — Z79899 Other long term (current) drug therapy: Secondary | ICD-10-CM

## 2021-02-08 DIAGNOSIS — E782 Mixed hyperlipidemia: Secondary | ICD-10-CM

## 2021-02-08 DIAGNOSIS — K760 Fatty (change of) liver, not elsewhere classified: Secondary | ICD-10-CM

## 2021-02-08 DIAGNOSIS — I10 Essential (primary) hypertension: Secondary | ICD-10-CM

## 2021-02-08 DIAGNOSIS — E349 Endocrine disorder, unspecified: Secondary | ICD-10-CM | POA: Diagnosis not present

## 2021-02-08 DIAGNOSIS — E559 Vitamin D deficiency, unspecified: Secondary | ICD-10-CM

## 2021-02-08 LAB — VITAMIN D 25 HYDROXY (VIT D DEFICIENCY, FRACTURES): VITD: 45.23 ng/mL (ref 30.00–100.00)

## 2021-02-08 LAB — URIC ACID: Uric Acid, Serum: 6.4 mg/dL (ref 4.0–7.8)

## 2021-02-08 LAB — CBC
HCT: 50.7 % (ref 39.0–52.0)
Hemoglobin: 17.2 g/dL — ABNORMAL HIGH (ref 13.0–17.0)
MCHC: 34 g/dL (ref 30.0–36.0)
MCV: 87.1 fl (ref 78.0–100.0)
Platelets: 202 10*3/uL (ref 150.0–400.0)
RBC: 5.82 Mil/uL — ABNORMAL HIGH (ref 4.22–5.81)
RDW: 14.5 % (ref 11.5–15.5)
WBC: 6.8 10*3/uL (ref 4.0–10.5)

## 2021-02-08 LAB — COMPREHENSIVE METABOLIC PANEL
ALT: 27 U/L (ref 0–53)
AST: 22 U/L (ref 0–37)
Albumin: 4.6 g/dL (ref 3.5–5.2)
Alkaline Phosphatase: 51 U/L (ref 39–117)
BUN: 16 mg/dL (ref 6–23)
CO2: 28 mEq/L (ref 19–32)
Calcium: 9.7 mg/dL (ref 8.4–10.5)
Chloride: 101 mEq/L (ref 96–112)
Creatinine, Ser: 1.12 mg/dL (ref 0.40–1.50)
GFR: 68.46 mL/min (ref >60.00)
Glucose, Bld: 104 mg/dL — ABNORMAL HIGH (ref 70–99)
Potassium: 4.7 mEq/L (ref 3.5–5.1)
Sodium: 136 mEq/L (ref 135–145)
Total Bilirubin: 1.2 mg/dL (ref 0.2–1.2)
Total Protein: 6.6 g/dL (ref 6.0–8.3)

## 2021-02-08 LAB — HEMOGLOBIN A1C: Hgb A1c MFr Bld: 5.6 % (ref 4.6–6.5)

## 2021-02-08 LAB — TESTOSTERONE: Testosterone: 837.91 ng/dL (ref 300.00–890.00)

## 2021-02-08 LAB — LIPID PANEL
Cholesterol: 141 mg/dL (ref 0–200)
HDL: 52.7 mg/dL (ref >39.00)
LDL Cholesterol: 69 mg/dL (ref 0–99)
NonHDL: 88.7
Total CHOL/HDL Ratio: 3
Triglycerides: 100 mg/dL (ref 0.0–149.0)
VLDL: 20 mg/dL (ref 0.0–40.0)

## 2021-02-08 LAB — TSH: TSH: 0.73 u[IU]/mL (ref 0.35–5.50)

## 2021-02-08 NOTE — Assessment & Plan Note (Signed)
Supplement and monitor 

## 2021-02-08 NOTE — Patient Instructions (Addendum)
  Paxlovid is the new COVID medication we can give you if you get COVID so make sure you test if you have symptoms because we have to treat by day 5 of symptoms for it to be effective. If you are positive let us know so we can treat. If a home test is negative and your symptoms are persistent get a PCR test. Can check testing locations at [email or web address] If you are positive we will make an appointment with [place] and we will send in Paxlovid if you would like it. Check with your pharmacy before we meet to confirm they have it in stock, if they do not then we can get the prescription at the [facility] once to twice daily Probiotic daily

## 2021-02-08 NOTE — Progress Notes (Signed)
Patient ID: Taylor Mcdonald, male    DOB: Jan 03, 1956  Age: 65 y.o. MRN: [id number]    Subjective:  Subjective  HPI MAJID MCCRAVY presents for office visit today for follow up on htn and fatty liver disease. He states that he has no recent hospitalizations or ER visits to report. Denies CP/palp/SOB/HA/congestion/fevers/GI or GU c/o. Taking meds as prescribed. He reports that 2-3 weeks ago he has been experiencing low testosterone symptoms like fatigue and low energy and would like to get his blood checked to make sure everything is fine.   Review of Systems  Constitutional:  Positive for fatigue. Negative for chills and fever.  HENT:  Negative for congestion, rhinorrhea, sinus pressure, sinus pain and sore throat.   Eyes:  Negative for pain.  Respiratory:  Negative for cough and shortness of breath.   Cardiovascular:  Negative for chest pain, palpitations and leg swelling.  Gastrointestinal:  Negative for abdominal pain, blood in stool, diarrhea, nausea and vomiting.       (+) bloating  Genitourinary:  Negative for flank pain, frequency and penile pain.  Musculoskeletal:  Negative for back pain.  Neurological:  Negative for headaches.   History Past Medical History:  Diagnosis Date   Allergy    Anxiety 03/04/2012   Fatigue    GERD (gastroesophageal reflux disease)    Gout    Hyperglycemia    Hyperlipidemia    Hypertension    Insomnia    Internal nasal lesion 07/04/2016   Low testosterone    Lower back pain 09/09/2012   Migraine    Pain of left heel 10/01/2011   Preventative health care 07/24/2012   RLS (restless legs syndrome)    Sinusitis 03/04/2012   Varicose veins of both lower extremities 12/12/2016   Vasovagal near syncope 04/30/2013   Vitamin D deficiency 12/23/2011    He has a past surgical history that includes Tonsillectomy; Panendoscopy; and Lumbar laminectomy/decompression microdiscectomy (N/A, 05/13/2013).   His family history includes Cancer (age of onset: 5) in  his maternal grandmother; Dementia in his maternal grandfather; Hypertension in his maternal grandmother and son; Obesity in his maternal grandmother and mother.He reports that he has never smoked. He has never used smokeless tobacco. He reports previous drug use. Drug: Hydrocodone. He reports that he does not drink alcohol.  Current Outpatient Medications on File Prior to Visit  Medication Sig Dispense Refill   allopurinol (ZYLOPRIM) 100 MG tablet TAKE 2 TABLETS (200 MG TOTAL) BY MOUTH DAILY. 180 tablet 3   anastrozole (ARIMIDEX) 1 MG tablet Take 1/2 tablet (0.5 mg total) by mouth daily. 45 tablet 3   aspirin 81 MG tablet Take 1 tablet (81 mg total) by mouth daily. Resume 4 days post-op 30 tablet    atorvastatin (LIPITOR) 10 MG tablet TAKE 1 TABLET BY MOUTH ONCE DAILY 90 tablet 1   Fish Oil-Cholecalciferol (FISH OIL + D3) 1200-1000 MG-UNIT CAPS Take 1 capsule by mouth daily.     fluticasone (FLONASE) 50 MCG/ACT nasal spray PLACE 2 SPRAYS INTO THE NOSE DAILY. 48 g 3   lisinopril (ZESTRIL) 20 MG tablet TAKE 1 TABLET (20 MG TOTAL) BY MOUTH DAILY. 90 tablet 1   omeprazole (PRILOSEC) 20 MG capsule Take 1 capsule (20 mg total) by mouth daily. 90 capsule 3   OVER THE COUNTER MEDICATION 12 hr decongestant 120mg      propranolol (INDERAL) 40 MG tablet TAKE 1 TABLET (40 MG TOTAL) BY MOUTH DAILY. 90 tablet 1   sildenafil (REVATIO) 20 MG tablet  Take 1-4 tablets (20-80 mg total) by mouth daily as needed. 35 tablet 5   testosterone cypionate (DEPOTESTOSTERONE CYPIONATE) 200 MG/ML injection INJECT 1 ML UNDER THE SKIN EVERY 14 DAYS 10 mL 3   zolpidem (AMBIEN) 10 MG tablet TAKE 1/2 TO 1 TABLET BY MOUTH AT BEDTIME AS NEEDED FOR SLEEP 30 tablet 5   No current facility-administered medications on file prior to visit.     Objective:  Objective  Physical Exam Constitutional:      General: He is not in acute distress.    Appearance: Normal appearance. He is not ill-appearing or toxic-appearing.  HENT:     Head:  Normocephalic and atraumatic.     Right Ear: Tympanic membrane, ear canal and external ear normal.     Left Ear: Tympanic membrane, ear canal and external ear normal.     Nose: No congestion or rhinorrhea.  Eyes:     Extraocular Movements: Extraocular movements intact.     Pupils: Pupils are equal, round, and reactive to light.  Cardiovascular:     Rate and Rhythm: Normal rate and regular rhythm.     Pulses: Normal pulses.     Heart sounds: Normal heart sounds. No murmur heard. Pulmonary:     Effort: Pulmonary effort is normal. No respiratory distress.     Breath sounds: Normal breath sounds. No wheezing, rhonchi or rales.  Abdominal:     General: Bowel sounds are normal.     Palpations: Abdomen is soft. There is no mass.     Tenderness: no abdominal tenderness There is no guarding.     Hernia: No hernia is present.  Musculoskeletal:        General: Normal range of motion.     Cervical back: Normal range of motion and neck supple.  Skin:    General: Skin is warm and dry.  Neurological:     Mental Status: He is alert and oriented to person, place, and time.  Psychiatric:        Behavior: Behavior normal.   BP 132/84   Pulse 66   Temp 97.6 F (36.4 C)   Resp 16   Wt 191 lb 6.4 oz (86.8 kg)   SpO2 99%   BMI 24.57 kg/m  Wt Readings from Last 3 Encounters:  02/08/21 191 lb 6.4 oz (86.8 kg)  11/14/20 194 lb (88 kg)  09/07/20 195 lb (88.5 kg)     Lab Results  Component Value Date   WBC 6.8 02/08/2021   HGB 17.2 (H) 02/08/2021   HCT 50.7 02/08/2021   PLT 202.0 02/08/2021   GLUCOSE 104 (H) 02/08/2021   CHOL 141 02/08/2021   TRIG 100.0 02/08/2021   HDL 52.70 02/08/2021   LDLDIRECT 125.7 02/10/2013   LDLCALC 69 02/08/2021   ALT 27 02/08/2021   AST 22 02/08/2021   NA 136 02/08/2021   K 4.7 02/08/2021   CL 101 02/08/2021   CREATININE 1.12 02/08/2021   BUN 16 02/08/2021   CO2 28 02/08/2021   TSH 0.73 02/08/2021   PSA 0.64 12/25/2017   HGBA1C 5.6 02/08/2021    MICROALBUR <0.7 12/25/2017    US Abdomen Complete  Result Date: 08/21/2020 CLINICAL DATA:  Left renal cyst.  Fatty liver. EXAM: ABDOMEN ULTRASOUND COMPLETE COMPARISON:  06/29/2019 FINDINGS: Gallbladder: No gallstones or wall thickening visualized. No sonographic Murphy sign noted by sonographer. Common bile duct: Diameter: 2 mm Liver: Diffusely increased parenchymal echogenicity without a focal lesion identified. Portal vein is patent on color Doppler imaging  with normal direction of blood flow towards the liver. IVC: No abnormality visualized. Pancreas: Visualized portion unremarkable. Spleen: Size and appearance within normal limits. Right Kidney: Length: 10.5 cm. Echogenicity within normal limits. No solid mass or hydronephrosis. 3.9 cm upper pole cyst, either new or larger compared to the prior study but benign in appearance. Left Kidney: Length: 10.1 cm. Echogenicity within normal limits. No solid mass or hydronephrosis. 1.5 cm simple cyst, slightly larger than on the prior study. Abdominal aorta: No aneurysm visualized. Other findings: None. IMPRESSION: 1. Echogenic liver compatible with steatosis. 2. Bilateral renal cysts. Electronically Signed   By: Sebastian Ache M.D.   On: 08/21/2020 11:22     Assessment & Plan:  Plan    No orders of the defined types were placed in this encounter.   Problem List Items Addressed This Visit     Gout    No recent exacerbation. Continue to hydrate well and avoid offending foods.        Relevant Orders   Uric acid (Completed)   Hypertension    Well controlled, no changes to meds. Encouraged heart healthy diet such as the DASH diet and exercise as tolerated.        Relevant Orders   CBC (Completed)   Comprehensive metabolic panel (Completed)   TSH (Completed)   Testosterone deficiency - Primary   Relevant Orders   Testosterone (Completed)   Insomnia    Encouraged good sleep hygiene such as dark, quiet room. No blue/green glowing lights such as  computer screens in bedroom. No alcohol or stimulants in evening. Cut down on caffeine as able. Regular exercise is helpful but not just prior to bed time.        Hyperlipidemia    Encourage heart healthy diet such as MIND or DASH diet, increase exercise, avoid trans fats, simple carbohydrates and processed foods, consider a krill or fish or flaxseed oil cap daily. Tolerating Atorvastatin       Relevant Orders   Lipid panel (Completed)   Hyperglycemia    hgba1c acceptable, minimize simple carbs. Increase exercise as tolerated.        Relevant Orders   Hemoglobin A1c (Completed)   Vitamin D deficiency    Supplement and monitor       Relevant Orders   VITAMIN D 25 Hydroxy (Vit-D Deficiency, Fractures) (Completed)   Hypogonadism in male    Supplement and monitor       Fatty liver    Minimize simple carbohydrates, fatty foods and processed foods, increase water and exercise.       Other Visit Diagnoses     High risk medication use       Relevant Orders   DRUG MONITORING, PANEL 8 WITH CONFIRMATION, URINE (Completed)       Follow-up: Return in about 3 months (around 05/11/2021), or 3 mn f/u visit and 6 mn CPE, for annual exam.  I, Billie Lade, acting as a scribe for Danise Edge, MD, have documented all relevent documentation on behalf of Danise Edge, MD, as directed by Danise Edge, MD while in the presence of Danise Edge, MD.  I, Bradd Canary, MD personally performed the services described in this documentation. All medical record entries made by the scribe were at my direction and in my presence. I have reviewed the chart and agree that the record reflects my personal performance and is accurate and complete

## 2021-02-08 NOTE — Assessment & Plan Note (Signed)
hgba1c acceptable, minimize simple carbs. Increase exercise as tolerated.  

## 2021-02-08 NOTE — Assessment & Plan Note (Signed)
Encourage heart healthy diet such as MIND or DASH diet, increase exercise, avoid trans fats, simple carbohydrates and processed foods, consider a krill or fish or flaxseed oil cap daily. Tolerating Atorvastatin 

## 2021-02-08 NOTE — Assessment & Plan Note (Signed)
Well controlled, no changes to meds. Encouraged heart healthy diet such as the DASH diet and exercise as tolerated.  °

## 2021-02-10 LAB — DRUG MONITORING, PANEL 8 WITH CONFIRMATION, URINE
6 Acetylmorphine: NEGATIVE ng/mL (ref <10)
Alcohol Metabolites: NEGATIVE ng/mL (ref <500)
Amphetamines: NEGATIVE ng/mL (ref <500)
Benzodiazepines: NEGATIVE ng/mL (ref <100)
Buprenorphine, Urine: NEGATIVE ng/mL (ref <5)
Cocaine Metabolite: NEGATIVE ng/mL (ref <150)
Creatinine: 84.1 mg/dL (ref >=20.0)
MDMA: NEGATIVE ng/mL (ref <500)
Marijuana Metabolite: NEGATIVE ng/mL (ref <20)
Opiates: NEGATIVE ng/mL (ref <100)
Oxidant: NEGATIVE ug/mL (ref <200)
Oxycodone: NEGATIVE ng/mL (ref <100)
pH: 7 (ref 4.5–9.0)

## 2021-02-10 LAB — DM TEMPLATE

## 2021-02-10 NOTE — Assessment & Plan Note (Signed)
Supplement and monitor 

## 2021-02-10 NOTE — Assessment & Plan Note (Signed)
Minimize simple carbohydrates, fatty foods and processed foods, increase water and exercise.

## 2021-02-10 NOTE — Assessment & Plan Note (Signed)
No recent exacerbation. Continue to hydrate well and avoid offending foods.

## 2021-02-10 NOTE — Assessment & Plan Note (Signed)
Encouraged good sleep hygiene such as dark, quiet room. No blue/green glowing lights such as computer screens in bedroom. No alcohol or stimulants in evening. Cut down on caffeine as able. Regular exercise is helpful but not just prior to bed time.  

## 2021-02-12 ENCOUNTER — Other Ambulatory Visit (HOSPITAL_BASED_OUTPATIENT_CLINIC_OR_DEPARTMENT_OTHER): Payer: Self-pay

## 2021-02-12 MED FILL — Fluticasone Propionate Nasal Susp 50 MCG/ACT: NASAL | 90 days supply | Qty: 48 | Fill #0 | Status: AC

## 2021-02-18 IMAGING — US US ABDOMEN COMPLETE
1 series · 14 of 25 positions shown · non-contrast
Comparison: None.

CLINICAL DATA: Elevated bilirubin and abdominal pain

EXAM:
ABDOMEN ULTRASOUND COMPLETE

[Series 1: us abdomen complete · 14 of 111 slices shown]
[im 1/111]
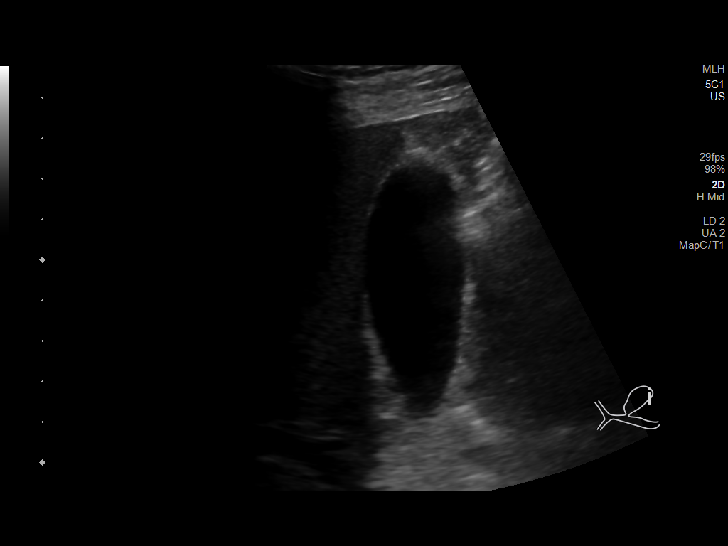
[im 10/111]
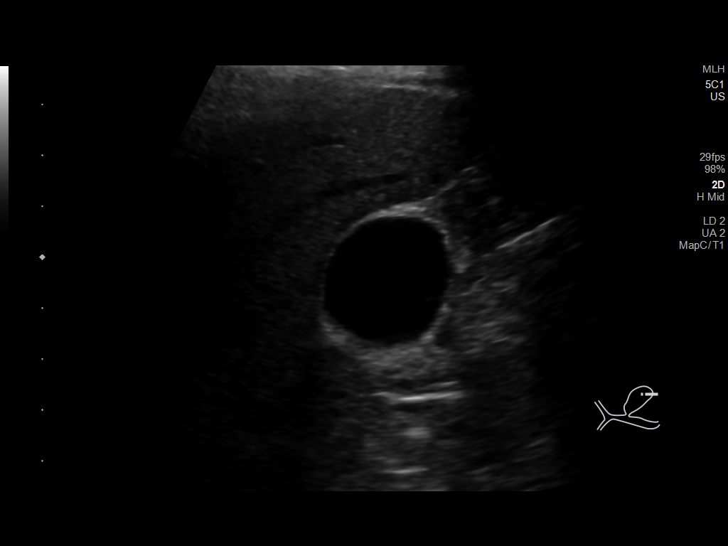
[im 19/111]
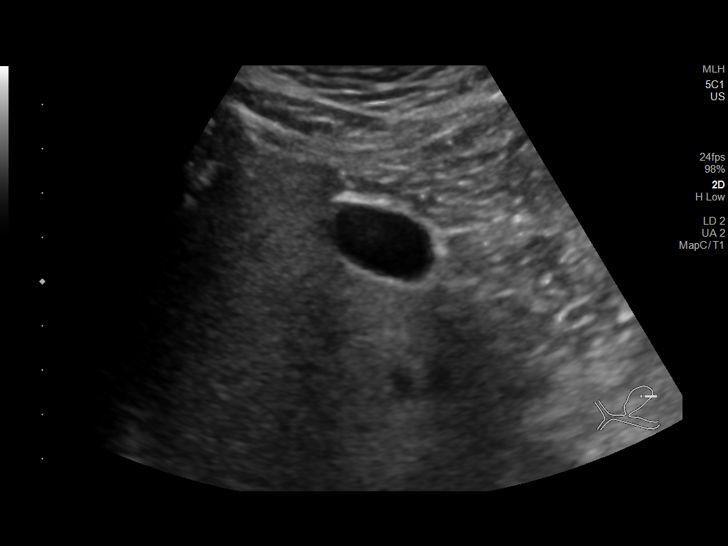
[im 28/111]
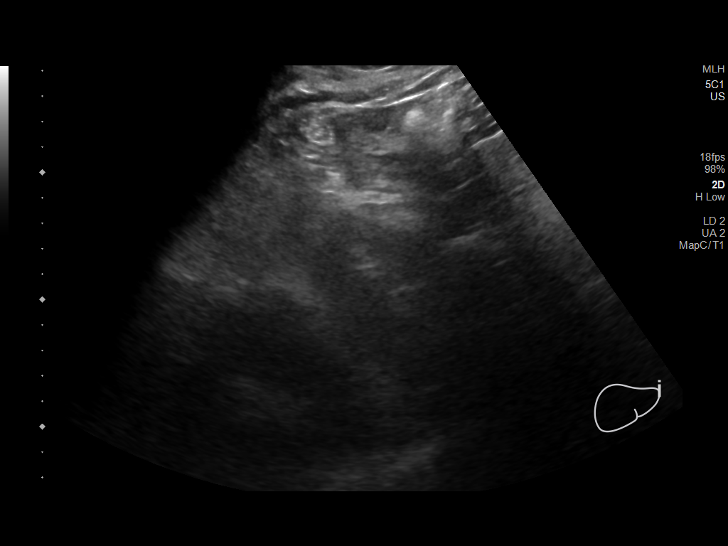
[im 37/111]
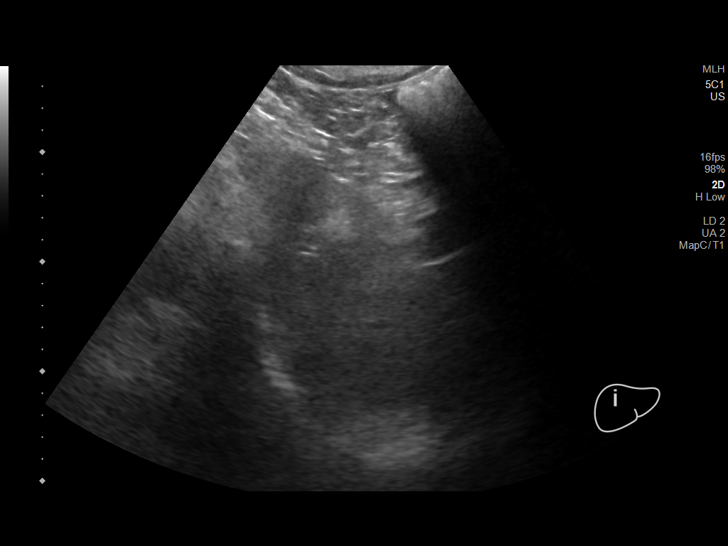
[im 42/111]
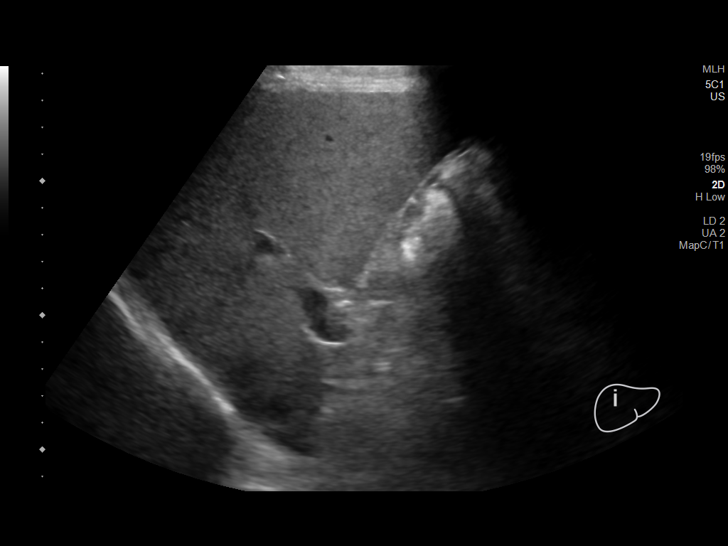
[im 51/111]
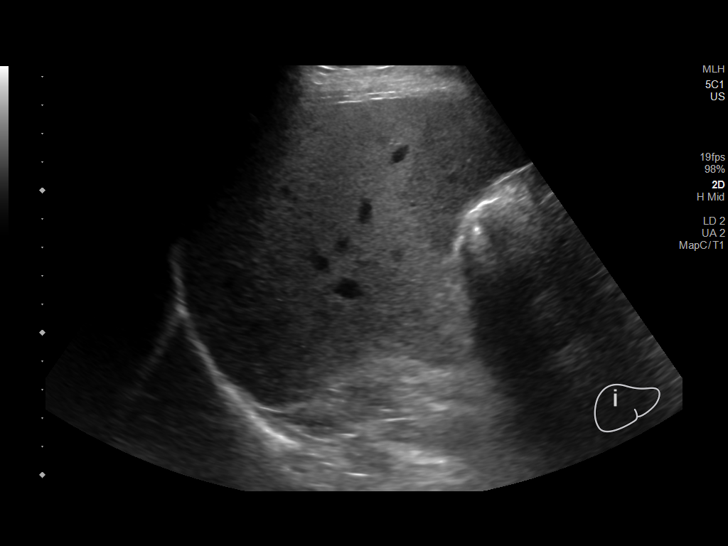
[im 60/111]
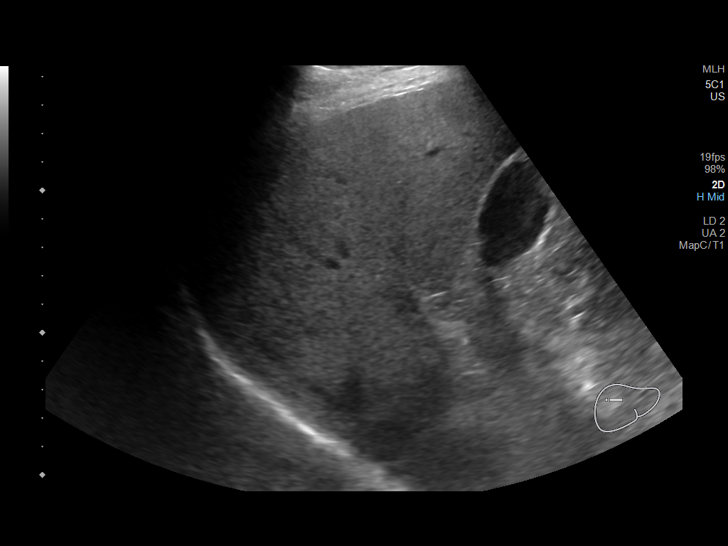
[im 69/111]
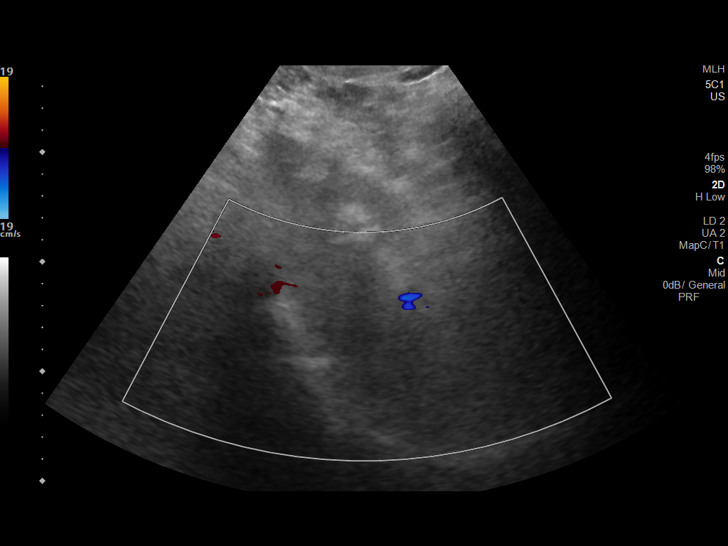
[im 74/111]
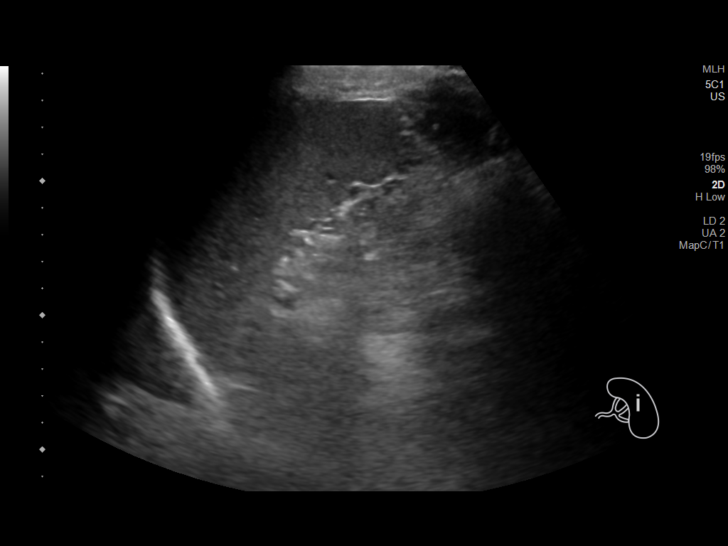
[im 83/111]
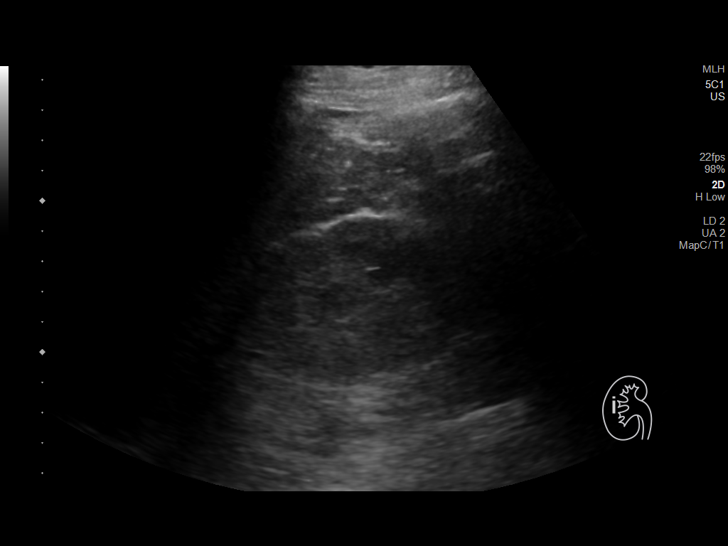
[im 92/111]
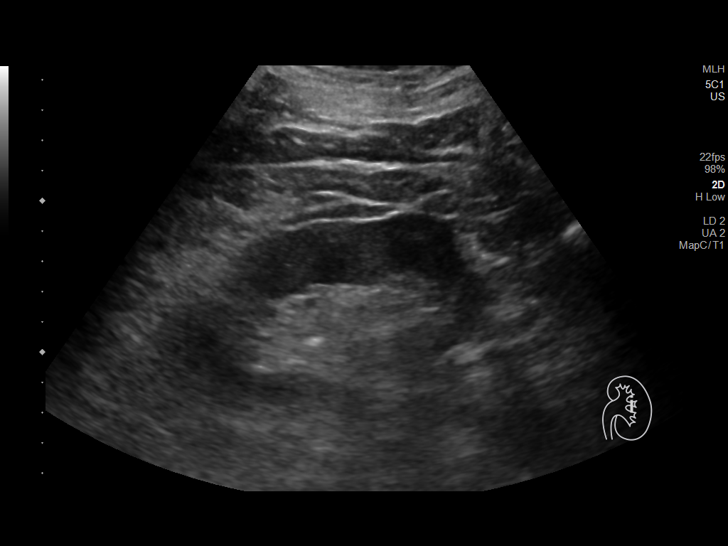
[im 101/111]
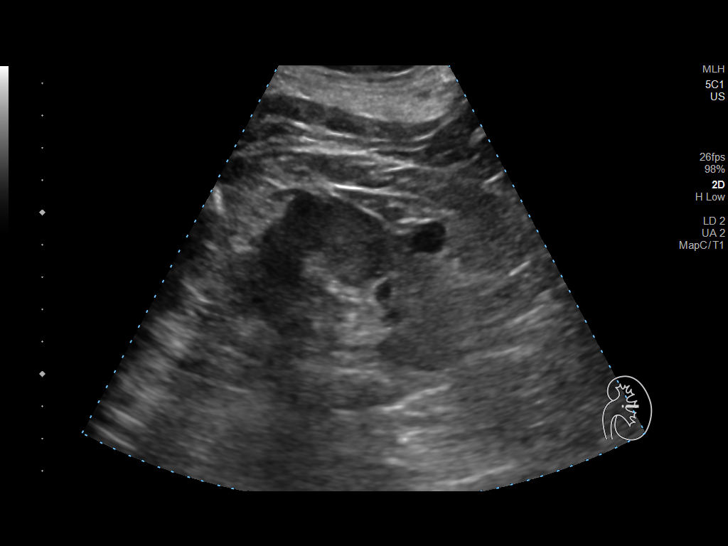
[im 111/111]
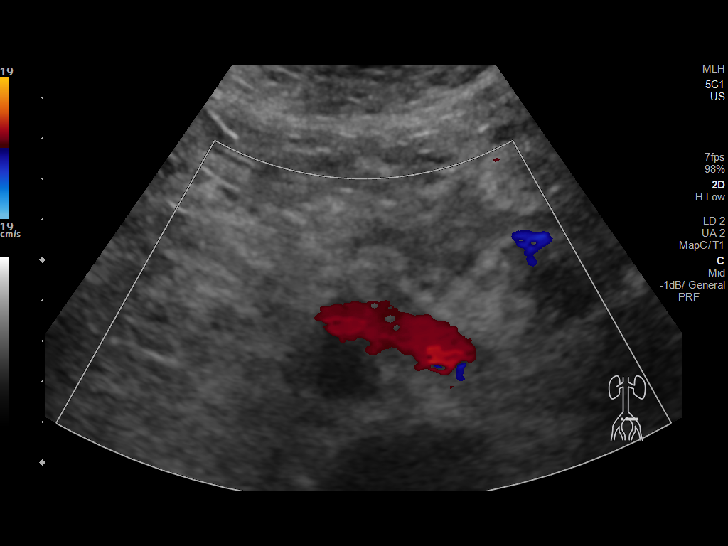

[14 of 25 positions shown; findings below may reference images not displayed]

FINDINGS: Gallbladder: No gallstones or wall thickening visualized. No
sonographic Murphy sign noted by sonographer.

Common bile duct: Not well visualized, obscured by overlying bowel
gas.

Liver: Mild increased echogenicity is noted. No focal mass lesion is
seen. No intrahepatic ductal dilatation is seen. Portal vein is
patent on color Doppler imaging with normal direction of blood flow
towards the liver.

IVC: No abnormality visualized.

Pancreas: Visualized portion unremarkable.

Spleen: Size and appearance within normal limits.

Right Kidney: Length: 11.6 cm. Echogenicity within normal limits.
No mass or hydronephrosis visualized.

Left Kidney: Length: 11.6 cm. 1.2 cm cyst is noted in the upper
pole of the left kidney. No hydronephrosis or mass lesion is noted.

Abdominal aorta: No aneurysm visualized.

Other findings: None.
IMPRESSION: Common bile duct is not well visualized although no biliary ductal
dilatation is seen within the liver or pancreatic head to suggest
obstruction.

Fatty liver.

Left renal cyst.

## 2021-03-12 ENCOUNTER — Other Ambulatory Visit: Payer: Self-pay | Admitting: Family Medicine

## 2021-03-12 ENCOUNTER — Other Ambulatory Visit (HOSPITAL_BASED_OUTPATIENT_CLINIC_OR_DEPARTMENT_OTHER): Payer: Self-pay

## 2021-03-12 MED ORDER — ATORVASTATIN CALCIUM 10 MG PO TABS
ORAL_TABLET | Freq: Every day | ORAL | 1 refills | Status: DC
  Filled 2021-03-12: qty 90, 90d supply, fill #0
  Filled 2021-06-19: qty 90, 90d supply, fill #1

## 2021-03-15 ENCOUNTER — Other Ambulatory Visit (HOSPITAL_BASED_OUTPATIENT_CLINIC_OR_DEPARTMENT_OTHER): Payer: Self-pay

## 2021-03-19 ENCOUNTER — Other Ambulatory Visit (HOSPITAL_BASED_OUTPATIENT_CLINIC_OR_DEPARTMENT_OTHER): Payer: Self-pay

## 2021-03-26 ENCOUNTER — Other Ambulatory Visit (HOSPITAL_BASED_OUTPATIENT_CLINIC_OR_DEPARTMENT_OTHER): Payer: Self-pay

## 2021-03-26 DIAGNOSIS — M545 Low back pain, unspecified: Secondary | ICD-10-CM | POA: Diagnosis not present

## 2021-03-26 MED ORDER — PREDNISONE 5 MG PO TABS
ORAL_TABLET | ORAL | 0 refills | Status: DC
  Filled 2021-03-26: qty 21, 6d supply, fill #0

## 2021-04-11 ENCOUNTER — Telehealth: Payer: Self-pay

## 2021-04-11 NOTE — Telephone Encounter (Signed)
Patient received a letter stating the  testosterone cypionate (DEPOTESTOSTERONE CYPIONATE) 200 MG/ML injection Prior auth will expire 05-05-2021.  Needing the insurance to be called to continue use of Rx.  Please call wife Tami at [phone]  Thanks, Rosey Bath

## 2021-04-13 NOTE — Telephone Encounter (Signed)
PA submitted today on Covermymeds- pending

## 2021-04-14 ENCOUNTER — Other Ambulatory Visit (HOSPITAL_COMMUNITY): Payer: Self-pay

## 2021-04-16 NOTE — Telephone Encounter (Signed)
Wife notified of appeal process. Wife voiced understanding.

## 2021-04-16 NOTE — Telephone Encounter (Signed)
Appeal started today on covermymeds for denial.

## 2021-05-03 ENCOUNTER — Other Ambulatory Visit: Payer: Medicare PPO

## 2021-05-03 ENCOUNTER — Other Ambulatory Visit: Payer: Self-pay

## 2021-05-03 DIAGNOSIS — E291 Testicular hypofunction: Secondary | ICD-10-CM

## 2021-05-08 LAB — COMPREHENSIVE METABOLIC PANEL
ALT: 27 IU/L (ref 0–44)
AST: 27 IU/L (ref 0–40)
Albumin/Globulin Ratio: 1.8 (ref 1.2–2.2)
Albumin: 4 g/dL (ref 3.8–4.8)
Alkaline Phosphatase: 53 IU/L (ref 44–121)
BUN/Creatinine Ratio: 13 (ref 10–24)
BUN: 16 mg/dL (ref 8–27)
Bilirubin Total: 1 mg/dL (ref 0.0–1.2)
CO2: 24 mmol/L (ref 20–29)
Calcium: 9.4 mg/dL (ref 8.6–10.2)
Chloride: 100 mmol/L (ref 96–106)
Creatinine, Ser: 1.21 mg/dL (ref 0.76–1.27)
Globulin, Total: 2.2 g/dL (ref 1.5–4.5)
Glucose: 96 mg/dL (ref 70–99)
Potassium: 4.9 mmol/L (ref 3.5–5.2)
Sodium: 138 mmol/L (ref 134–144)
Total Protein: 6.2 g/dL (ref 6.0–8.5)
eGFR: 66 mL/min/{1.73_m2} (ref >59)

## 2021-05-08 LAB — CBC WITH DIFFERENTIAL
Basophils Absolute: 0.1 10*3/uL (ref 0.0–0.2)
Basos: 1 %
EOS (ABSOLUTE): 0.2 10*3/uL (ref 0.0–0.4)
Eos: 3 %
Hematocrit: 47.6 % (ref 37.5–51.0)
Hemoglobin: 16.2 g/dL (ref 13.0–17.7)
Immature Grans (Abs): 0 10*3/uL (ref 0.0–0.1)
Immature Granulocytes: 0 %
Lymphocytes Absolute: 2.5 10*3/uL (ref 0.7–3.1)
Lymphs: 32 %
MCH: 30.1 pg (ref 26.6–33.0)
MCHC: 34 g/dL (ref 31.5–35.7)
MCV: 88 fL (ref 79–97)
Monocytes Absolute: 0.8 10*3/uL (ref 0.1–0.9)
Monocytes: 11 %
Neutrophils Absolute: 4 10*3/uL (ref 1.4–7.0)
Neutrophils: 53 %
RBC: 5.39 x10E6/uL (ref 4.14–5.80)
RDW: 13.4 % (ref 11.6–15.4)
WBC: 7.6 10*3/uL (ref 3.4–10.8)

## 2021-05-08 LAB — TESTOSTERONE,FREE AND TOTAL
Testosterone, Free: 23.6 pg/mL — ABNORMAL HIGH (ref 6.6–18.1)
Testosterone: 1253 ng/dL — ABNORMAL HIGH (ref 264–916)

## 2021-05-09 ENCOUNTER — Other Ambulatory Visit: Payer: Medicare PPO

## 2021-05-15 ENCOUNTER — Other Ambulatory Visit: Payer: Self-pay | Admitting: Family Medicine

## 2021-05-15 DIAGNOSIS — G4700 Insomnia, unspecified: Secondary | ICD-10-CM

## 2021-05-15 MED ORDER — ZOLPIDEM TARTRATE 10 MG PO TABS
ORAL_TABLET | Freq: Every evening | ORAL | 5 refills | Status: DC | PRN
  Filled 2021-05-15: qty 30, 30d supply, fill #0
  Filled 2021-07-17: qty 30, 30d supply, fill #1
  Filled 2021-09-17: qty 30, 30d supply, fill #2

## 2021-05-15 NOTE — Telephone Encounter (Signed)
Requesting: Ambien 10mg   Contract: 12/25/2017 UDS: 02/08/2021 Last Visit: 02/08/2021 Next Visit: 05/21/2021 Last Refill: 11/06/2020 #30 and 3RF  Please Advise

## 2021-05-16 ENCOUNTER — Encounter: Payer: Self-pay | Admitting: Urology

## 2021-05-16 ENCOUNTER — Other Ambulatory Visit: Payer: Self-pay

## 2021-05-16 ENCOUNTER — Ambulatory Visit (INDEPENDENT_AMBULATORY_CARE_PROVIDER_SITE_OTHER): Payer: Medicare PPO | Admitting: Urology

## 2021-05-16 ENCOUNTER — Other Ambulatory Visit (HOSPITAL_BASED_OUTPATIENT_CLINIC_OR_DEPARTMENT_OTHER): Payer: Self-pay

## 2021-05-16 VITALS — BP 143/90 | HR 81

## 2021-05-16 DIAGNOSIS — N5201 Erectile dysfunction due to arterial insufficiency: Secondary | ICD-10-CM | POA: Diagnosis not present

## 2021-05-16 DIAGNOSIS — E291 Testicular hypofunction: Secondary | ICD-10-CM | POA: Diagnosis not present

## 2021-05-16 MED ORDER — TESTOSTERONE CYPIONATE 200 MG/ML IM SOLN
200.0000 mg | INTRAMUSCULAR | 3 refills | Status: DC
  Filled 2021-05-16 – 2021-06-12 (×2): qty 10, 28d supply, fill #0
  Filled 2021-06-22: qty 5, 70d supply, fill #0
  Filled 2021-08-29: qty 5, 70d supply, fill #1

## 2021-05-16 MED ORDER — SILDENAFIL CITRATE 20 MG PO TABS: 20.0000 mg | ORAL_TABLET | Freq: Every day | ORAL | 5 refills | Status: DC | PRN

## 2021-05-16 MED ORDER — ANASTROZOLE 1 MG PO TABS
0.5000 mg | ORAL_TABLET | Freq: Every day | ORAL | 3 refills | Status: DC
  Filled 2021-05-16 – 2021-07-05 (×2): qty 45, 90d supply, fill #0
  Filled 2021-10-20: qty 45, 90d supply, fill #1
  Filled 2022-02-04: qty 45, 90d supply, fill #2
  Filled 2022-05-10: qty 45, 90d supply, fill #3

## 2021-05-16 NOTE — Progress Notes (Signed)
Urological Symptom Review  Patient is experiencing the following symptoms: none   Review of Systems  Gastrointestinal (upper)  : Negative for upper GI symptoms  Gastrointestinal (lower) : Negative for lower GI symptoms  Constitutional : Negative for symptoms  Skin: Negative for skin symptoms  Eyes: Negative for eye symptoms  Ear/Nose/Throat : Negative for Ear/Nose/Throat symptoms  Hematologic/Lymphatic: Negative for Hematologic/Lymphatic symptoms  Cardiovascular : Negative for cardiovascular symptoms  Respiratory : Negative for respiratory symptoms  Endocrine: Negative for endocrine symptoms  Musculoskeletal: Back pain  Neurological: Negative for neurological symptoms  Psychologic: Negative for psychiatric symptoms  

## 2021-05-16 NOTE — Patient Instructions (Signed)
Testosterone Injection What is this medication? TESTOSTERONE (tes TOS ter one) is used to increase testosterone levels in your body. It belongs to a group of medications called androgen hormones. This medicine may be used for other purposes; ask your health care provider or pharmacist if you have questions. COMMON BRAND NAME(S): Andro-L.A., Aveed, Delatestryl, Depo-Testosterone, Virilon What should I tell my care team before I take this medication? They need to know if you have any of these conditions: Cancer Diabetes Heart disease Kidney disease Liver disease Lung disease Prostate disease An unusual or allergic reaction to testosterone, other medications, foods, dyes, or preservatives If a male partner is pregnant or trying to get pregnant Breast-feeding How should I use this medication? This medication is for injection into a muscle. It is usually given in a hospital or clinic. A special MedGuide will be given to you by the pharmacist with each prescription and refill. Be sure to read this information carefully each time. Contact your care team regarding the use of this medication in children. While this medication may be prescribed for children as young as 12 years of age for selected conditions, precautions do apply. Overdosage: If you think you have taken too much of this medicine contact a poison control center or emergency room at once. NOTE: This medicine is only for you. Do not share this medicine with others. What if I miss a dose? Try not to miss a dose. Your care team will tell you when your next injection is due. Notify the office if you are unable to keep an appointment. What may interact with this medication? Medications for diabetes Medications that treat or prevent blood clots like warfarin Oxyphenbutazone Propranolol Steroid medications like prednisone or cortisone This list may not describe all possible interactions. Give your health care provider a list of all the  medicines, herbs, non-prescription drugs, or dietary supplements you use. Also tell them if you smoke, drink alcohol, or use illegal drugs. Some items may interact with your medicine. What should I watch for while using this medication? Visit your care team for regular checks on your progress. They will need to check the level of testosterone in your blood. This medication is only approved for use in men who have low levels of testosterone related to certain medical conditions. Heart attacks and strokes have been reported with the use of this medication. Notify your care team and seek emergency treatment if you develop breathing problems; changes in vision; confusion; chest pain or chest tightness; sudden arm pain; severe, sudden headache; trouble speaking or understanding; sudden numbness or weakness of the face, arm or leg; loss of balance or coordination. Talk to your care team about the risks and benefits of this medication. This medication may affect blood sugar levels. If you have diabetes, check with your care team before you change your diet or the dose of your diabetic medication. Testosterone injections are not commonly used in women. Women should inform their care team if they wish to become pregnant or think they might be pregnant. There is a potential for serious side effects to an unborn child. Talk to your care team or pharmacist for more information. Talk with your care team about your birth control options while taking this medication. This medication is banned from use in athletes by most athletic organizations. What side effects may I notice from receiving this medication? Side effects that you should report to your care team as soon as possible: Allergic reactions-skin rash, itching, hives, swelling of the   face, lips, tongue, or throat Blood clot-pain, swelling, or warmth in the leg, shortness of breath, chest pain Heart attack-pain or tightness in the chest, shoulders, arms or jaw,  nausea, shortness of breath, cold or clammy skin, feeling faint or lightheaded Increase in blood pressure Liver injury-right upper belly pain, loss of appetite, nausea, light-colored stool, dark yellow or brown urine, yellowing of the skin or eyes, unusual weakness or fatigue Mood swings, irritability, or hostility Prolonged or painful erection Sleep apnea-loud snoring, gasping during sleep, daytime sleepiness Stroke-sudden numbness or weakness of the face, arm or leg, trouble speaking, confusion, trouble walking, loss of balance or coordination, dizziness, severe headache, change in vision Swelling of the ankles, hands, or feet Thoughts of suicide or self-harm, worsening mood, feelings of depression Side effects that usually do not require medical attention (report to your care team if they continue or are bothersome): Acne Change in sex drive or performance Pain, redness, or irritation at the application site Unexpected breast tissue growth This list may not describe all possible side effects. Call your doctor for medical advice about side effects. You may report side effects to FDA at 1-800-FDA-1088. Where should I keep my medication? Keep out of the reach of children. This medication can be abused. Keep your medication in a safe place to protect it from theft. Do not share this medication with anyone. Selling or giving away this medication is dangerous and against the law. Store at room temperature between 20 and 25 degrees C (68 and 77 degrees F). Do not freeze. Protect from light. Follow the directions for the product you are prescribed. Throw away any unused medication after the expiration date. NOTE: This sheet is a summary. It may not cover all possible information. If you have questions about this medicine, talk to your doctor, pharmacist, or health care provider.  2022 Elsevier/Gold Standard (2020-07-12 12:47:57)  

## 2021-05-16 NOTE — Progress Notes (Signed)
05/16/2021 1:44 PM   [name] Sep 06, 1955 [id number]  Referring provider: Bradd Canary, MD [address] [place],  [place] [address]  Followup hypogonadism and erectile dysfunction   HPI: [name] is a 18JY here for followup for erectile dysfunction and hypogonadism. Testosterone 1250, hgb 16.2, CMP normal. Good energy, good libido. He injected testosterone 2-3 days prior to getting labs. He has stable ED which is imporved with sildenafil prn. He denies any significant LUTS. Urine stream strong. Nocturia 1-2x. No other complaints today   PMH: Past Medical History:  Diagnosis Date   Allergy    Anxiety 03/04/2012   Fatigue    GERD (gastroesophageal reflux disease)    Gout    Hyperglycemia    Hyperlipidemia    Hypertension    Insomnia    Internal nasal lesion 07/04/2016   Low testosterone    Lower back pain 09/09/2012   Migraine    Pain of left heel 10/01/2011   Preventative health care 07/24/2012   RLS (restless legs syndrome)    Sinusitis 03/04/2012   Varicose veins of both lower extremities 12/12/2016   Vasovagal near syncope 04/30/2013   Vitamin D deficiency 12/23/2011    Surgical History: Past Surgical History:  Procedure Laterality Date   LUMBAR LAMINECTOMY/DECOMPRESSION MICRODISCECTOMY N/A 05/13/2013   Procedure: LUMBAR LAMINECTOMY/DECOMPRESSION MICRODISCECTOMY 1 LEVEL L3-4;  Surgeon: Javier Docker, MD;  Location: [facility];  Service: Orthopedics;  Laterality: N/A;   PANENDOSCOPY     TONSILLECTOMY      Home Medications:  Allergies as of 05/16/2021   Not on File      Medication List        Accurate as of May 16, 2021  1:44 PM. If you have any questions, ask your nurse or doctor.          allopurinol 100 MG tablet Commonly known as: ZYLOPRIM TAKE 2 TABLETS (200 MG TOTAL) BY MOUTH DAILY.   anastrozole 1 MG tablet Commonly known as: ARIMIDEX Take 1/2 tablet (0.5 mg total) by mouth daily.   aspirin 81 MG tablet Take 1 tablet  (81 mg total) by mouth daily. Resume 4 days post-op   atorvastatin 10 MG tablet Commonly known as: LIPITOR TAKE 1 TABLET BY MOUTH ONCE DAILY   Fish Oil + D3 1200-1000 MG-UNIT Caps Take 1 capsule by mouth daily.   fluticasone 50 MCG/ACT nasal spray Commonly known as: FLONASE PLACE 2 SPRAYS INTO THE NOSE DAILY.   lisinopril 20 MG tablet Commonly known as: ZESTRIL TAKE 1 TABLET (20 MG TOTAL) BY MOUTH DAILY.   omeprazole 20 MG capsule Commonly known as: PRILOSEC Take 1 capsule (20 mg total) by mouth daily.   OVER THE COUNTER MEDICATION 12 hr decongestant 120mg    predniSONE 5 MG tablet Commonly known as: DELTASONE Take 6 tablets by mouth on day 1, then take 5 tablets on day 2, then 4 tablets on day 3, then 3 tablets on day 4, then 2 tablets on day 5, then 1 tablet on day 6   propranolol 40 MG tablet Commonly known as: INDERAL TAKE 1 TABLET (40 MG TOTAL) BY MOUTH DAILY.   sildenafil 20 MG tablet Commonly known as: Revatio Take 1-4 tablets (20-80 mg total) by mouth daily as needed.   testosterone cypionate 200 MG/ML injection Commonly known as: DEPOTESTOSTERONE CYPIONATE INJECT 1 ML UNDER THE SKIN EVERY 14 DAYS   zolpidem 10 MG tablet Commonly known as: AMBIEN TAKE 1/2 TO 1 TABLET BY MOUTH  AT BEDTIME AS NEEDED FOR SLEEP        Allergies: Not on File  Family History: Family History  Problem Relation Age of Onset   Obesity Mother    Hypertension Son    Hypertension Maternal Grandmother    Obesity Maternal Grandmother    Cancer Maternal Grandmother 25       intestinal   Dementia Maternal Grandfather    Colon cancer Neg Hx    Esophageal cancer Neg Hx    Rectal cancer Neg Hx    Stomach cancer Neg Hx     Social History:  reports that he has never smoked. He has never used smokeless tobacco. He reports that he does not currently use drugs after having used the following drugs: Hydrocodone. He reports that he does not drink alcohol.  ROS: All other review of  systems were reviewed and are negative except what is noted above in HPI  Physical Exam: BP (!) 143/90   Pulse 81   Constitutional:  Alert and oriented, No acute distress. HEENT: [place] AT, moist mucus membranes.  Trachea midline, no masses. Cardiovascular: No clubbing, cyanosis, or edema. Respiratory: Normal respiratory effort, no increased work of breathing. GI: Abdomen is soft, nontender, nondistended, no abdominal masses GU: No CVA tenderness.  Lymph: No cervical or inguinal lymphadenopathy. Skin: No rashes, bruises or suspicious lesions. Neurologic: Grossly intact, no focal deficits, moving all 4 extremities. Psychiatric: Normal mood and affect.  Laboratory Data: Lab Results  Component Value Date   WBC 7.6 05/03/2021   HGB 16.2 05/03/2021   HCT 47.6 05/03/2021   MCV 88 05/03/2021   PLT 202.0 02/08/2021    Lab Results  Component Value Date   CREATININE 1.21 05/03/2021    Lab Results  Component Value Date   PSA 0.64 12/25/2017   PSA 0.63 11/23/2015   PSA 0.50 08/25/2014    Lab Results  Component Value Date   TESTOSTERONE 1,253 (H) 05/03/2021    Lab Results  Component Value Date   HGBA1C 5.6 02/08/2021    Urinalysis    Component Value Date/Time   COLORURINE YELLOW 12/25/2017 1037   APPEARANCEUR Clear 11/14/2020 1349   LABSPEC 1.010 12/25/2017 1037   PHURINE 6.0 12/25/2017 1037   GLUCOSEU Negative 11/14/2020 1349   GLUCOSEU NEGATIVE 12/25/2017 1037   HGBUR NEGATIVE 12/25/2017 1037   HGBUR trace-intact 07/25/2009 0000   BILIRUBINUR Negative 11/14/2020 1349   KETONESUR NEGATIVE 12/25/2017 1037   PROTEINUR Negative 11/14/2020 1349   UROBILINOGEN 0.2 12/25/2017 1037   NITRITE Negative 11/14/2020 1349   NITRITE NEGATIVE 12/25/2017 1037   LEUKOCYTESUR Negative 11/14/2020 1349    Lab Results  Component Value Date   LABMICR Comment 05/05/2020    Pertinent Imaging:  No results found for this or any previous visit.  No results found for this or any  previous visit.  No results found for this or any previous visit.  No results found for this or any previous visit.  No results found for this or any previous visit.  No results found for this or any previous visit.  No results found for this or any previous visit.  No results found for this or any previous visit.   Assessment & Plan:    1. Erectile dysfunction due to arterial insufficiency -continue sildeanfil 40-80mg  prn - Urinalysis, Routine w reflex microscopic  2. Hypogonadism in male -continue IM testosterone 200mg  q 2 weeks -RTC 6 months with labs   No follow-ups on file.  , MD  [facility] [place]

## 2021-05-17 LAB — URINALYSIS, ROUTINE W REFLEX MICROSCOPIC
Bilirubin, UA: NEGATIVE
Glucose, UA: NEGATIVE
Ketones, UA: NEGATIVE
Leukocytes,UA: NEGATIVE
Nitrite, UA: NEGATIVE
Protein,UA: NEGATIVE
RBC, UA: NEGATIVE
Specific Gravity, UA: 1.01 (ref 1.005–1.030)
Urobilinogen, Ur: 0.2 mg/dL (ref 0.2–1.0)
pH, UA: 7 (ref 5.0–7.5)

## 2021-05-21 ENCOUNTER — Ambulatory Visit: Payer: Medicare PPO | Admitting: Family Medicine

## 2021-05-22 ENCOUNTER — Other Ambulatory Visit (HOSPITAL_BASED_OUTPATIENT_CLINIC_OR_DEPARTMENT_OTHER): Payer: Self-pay

## 2021-05-22 ENCOUNTER — Other Ambulatory Visit: Payer: Self-pay | Admitting: Family Medicine

## 2021-05-22 MED ORDER — PROPRANOLOL HCL 40 MG PO TABS
ORAL_TABLET | Freq: Every day | ORAL | 1 refills | Status: DC
  Filled 2021-05-22: qty 90, 90d supply, fill #0
  Filled 2021-08-29: qty 90, 90d supply, fill #1

## 2021-05-23 ENCOUNTER — Other Ambulatory Visit (HOSPITAL_BASED_OUTPATIENT_CLINIC_OR_DEPARTMENT_OTHER): Payer: Self-pay

## 2021-06-06 ENCOUNTER — Other Ambulatory Visit: Payer: Self-pay | Admitting: Urology

## 2021-06-07 ENCOUNTER — Other Ambulatory Visit: Payer: Self-pay | Admitting: Family Medicine

## 2021-06-07 ENCOUNTER — Other Ambulatory Visit (HOSPITAL_BASED_OUTPATIENT_CLINIC_OR_DEPARTMENT_OTHER): Payer: Self-pay

## 2021-06-07 MED ORDER — OMEPRAZOLE 20 MG PO CPDR
20.0000 mg | DELAYED_RELEASE_CAPSULE | Freq: Every day | ORAL | 1 refills | Status: DC
  Filled 2021-06-07: qty 90, 90d supply, fill #0
  Filled 2021-11-15: qty 90, 90d supply, fill #1

## 2021-06-12 ENCOUNTER — Other Ambulatory Visit (HOSPITAL_BASED_OUTPATIENT_CLINIC_OR_DEPARTMENT_OTHER): Payer: Self-pay

## 2021-06-12 ENCOUNTER — Other Ambulatory Visit: Payer: Self-pay | Admitting: Family Medicine

## 2021-06-12 MED ORDER — LISINOPRIL 20 MG PO TABS
ORAL_TABLET | Freq: Every day | ORAL | 1 refills | Status: DC
  Filled 2021-06-12: qty 90, 90d supply, fill #0
  Filled 2021-09-17: qty 90, 90d supply, fill #1

## 2021-06-12 MED FILL — Fluticasone Propionate Nasal Susp 50 MCG/ACT: NASAL | 90 days supply | Qty: 48 | Fill #1 | Status: AC

## 2021-06-18 ENCOUNTER — Other Ambulatory Visit (HOSPITAL_BASED_OUTPATIENT_CLINIC_OR_DEPARTMENT_OTHER): Payer: Self-pay

## 2021-06-19 ENCOUNTER — Other Ambulatory Visit (HOSPITAL_BASED_OUTPATIENT_CLINIC_OR_DEPARTMENT_OTHER): Payer: Self-pay

## 2021-06-22 ENCOUNTER — Other Ambulatory Visit (HOSPITAL_BASED_OUTPATIENT_CLINIC_OR_DEPARTMENT_OTHER): Payer: Self-pay

## 2021-06-28 DIAGNOSIS — L821 Other seborrheic keratosis: Secondary | ICD-10-CM | POA: Diagnosis not present

## 2021-06-28 DIAGNOSIS — Z85828 Personal history of other malignant neoplasm of skin: Secondary | ICD-10-CM | POA: Diagnosis not present

## 2021-06-28 DIAGNOSIS — L570 Actinic keratosis: Secondary | ICD-10-CM | POA: Diagnosis not present

## 2021-06-28 DIAGNOSIS — L814 Other melanin hyperpigmentation: Secondary | ICD-10-CM | POA: Diagnosis not present

## 2021-06-28 DIAGNOSIS — D1801 Hemangioma of skin and subcutaneous tissue: Secondary | ICD-10-CM | POA: Diagnosis not present

## 2021-06-28 DIAGNOSIS — L720 Epidermal cyst: Secondary | ICD-10-CM | POA: Diagnosis not present

## 2021-07-06 ENCOUNTER — Other Ambulatory Visit (HOSPITAL_BASED_OUTPATIENT_CLINIC_OR_DEPARTMENT_OTHER): Payer: Self-pay

## 2021-07-10 ENCOUNTER — Other Ambulatory Visit (HOSPITAL_BASED_OUTPATIENT_CLINIC_OR_DEPARTMENT_OTHER): Payer: Self-pay

## 2021-07-10 MED ORDER — FLUAD QUADRIVALENT 0.5 ML IM PRSY
PREFILLED_SYRINGE | INTRAMUSCULAR | 0 refills | Status: DC
  Filled 2021-07-10 (×2): qty 0.5, 1d supply, fill #0

## 2021-07-17 ENCOUNTER — Other Ambulatory Visit (HOSPITAL_BASED_OUTPATIENT_CLINIC_OR_DEPARTMENT_OTHER): Payer: Self-pay

## 2021-08-03 ENCOUNTER — Ambulatory Visit (INDEPENDENT_AMBULATORY_CARE_PROVIDER_SITE_OTHER): Payer: Medicare PPO

## 2021-08-03 VITALS — Ht 74.0 in | Wt 191.0 lb

## 2021-08-03 DIAGNOSIS — Z0000 Encounter for general adult medical examination without abnormal findings: Secondary | ICD-10-CM | POA: Diagnosis not present

## 2021-08-03 NOTE — Progress Notes (Signed)
Subjective:   Taylor Mcdonald is a 66 y.o. male who presents for an Initial Medicare Annual Wellness Visit.   I connected with Taylor Mcdonald today by telephone and verified that I am speaking with the correct person using two identifiers. Location patient: home Location provider: work Persons participating in the virtual visit: patient, Engineer, civil (consulting)nurse.    I discussed the limitations, risks, security and privacy concerns of performing an evaluation and management service by telephone and the availability of in person appointments. I also discussed with the patient that there may be a patient responsible charge related to this service. The patient expressed understanding and verbally consented to this telephonic visit.    Interactive audio and video telecommunications were attempted between this provider and patient, however failed, due to patient having technical difficulties OR patient did not have access to video capability.  We continued and completed visit with audio only.  Some vital signs may be absent or patient reported.   Time Spent with patient on telephone encounter: 25 minutes  Review of Systems     Cardiac Risk Factors include: advanced age (>655men, 70>65 women);male gender;hypertension;dyslipidemia     Objective:    Today's Vitals   08/03/21 0941  Weight: 191 lb (86.6 kg)  Height: 6\' 2"  (1.88 m)   Body mass index is 24.52 kg/m.  Advanced Directives 08/03/2021 08/26/2016 05/13/2013 05/10/2013  Does Patient Have a Medical Advance Directive? Yes Yes Patient does not have advance directive Patient does not have advance directive  Type of Advance Directive Healthcare Power of [place];Living will Healthcare Power of [place];Living will - -  Copy of Healthcare Power of Attorney in Chart? No - copy requested - - -  Pre-existing out of facility DNR order (yellow form or pink MOST form) - - No -    Current Medications (verified) Outpatient Encounter Medications as of 08/03/2021   Medication Sig   anastrozole (ARIMIDEX) 1 MG tablet Take 1/2 tablet (0.5 mg total) by mouth daily.   aspirin 81 MG tablet Take 1 tablet (81 mg total) by mouth daily. Resume 4 days post-op   atorvastatin (LIPITOR) 10 MG tablet TAKE 1 TABLET BY MOUTH ONCE DAILY   Fish Oil-Cholecalciferol (FISH OIL + D3) 1200-1000 MG-UNIT CAPS Take 1 capsule by mouth daily.   fluticasone (FLONASE) 50 MCG/ACT nasal spray PLACE 2 SPRAYS INTO THE NOSE DAILY.   influenza vaccine adjuvanted (FLUAD QUADRIVALENT) 0.5 ML injection Inject into the muscle.   lisinopril (ZESTRIL) 20 MG tablet TAKE 1 TABLET (20 MG TOTAL) BY MOUTH DAILY.   omeprazole (PRILOSEC) 20 MG capsule Take 1 capsule (20 mg total) by mouth daily.   OVER THE COUNTER MEDICATION 12 hr decongestant 120mg    propranolol (INDERAL) 40 MG tablet TAKE 1 TABLET (40 MG TOTAL) BY MOUTH DAILY.   sildenafil (REVATIO) 20 MG tablet TAKE 3 TABLETS BY MOUTH AS NEEDED   testosterone cypionate (DEPOTESTOSTERONE CYPIONATE) 200 MG/ML injection Inject 1 mL (200 mg total) into the skin every 14 (fourteen) days.   zolpidem (AMBIEN) 10 MG tablet TAKE 1/2 TO 1 TABLET BY MOUTH AT BEDTIME AS NEEDED FOR SLEEP   allopurinol (ZYLOPRIM) 100 MG tablet TAKE 2 TABLETS (200 MG TOTAL) BY MOUTH DAILY.   [DISCONTINUED] predniSONE (DELTASONE) 5 MG tablet Take 6 tablets by mouth on day 1, then take 5 tablets on day 2, then 4 tablets on day 3, then 3 tablets on day 4, then 2 tablets on day 5, then 1 tablet on day 6   No facility-administered encounter  medications on file as of 08/03/2021.    Allergies (verified) Patient has no allergy information on record.   History: Past Medical History:  Diagnosis Date   Allergy    Anxiety 03/04/2012   Fatigue    GERD (gastroesophageal reflux disease)    Gout    Hyperglycemia    Hyperlipidemia    Hypertension    Insomnia    Internal nasal lesion 07/04/2016   Low testosterone    Lower back pain 09/09/2012   Migraine    Pain of left heel 10/01/2011    Preventative health care 07/24/2012   RLS (restless legs syndrome)    Sinusitis 03/04/2012   Varicose veins of both lower extremities 12/12/2016   Vasovagal near syncope 04/30/2013   Vitamin D deficiency 12/23/2011   Past Surgical History:  Procedure Laterality Date   LUMBAR LAMINECTOMY/DECOMPRESSION MICRODISCECTOMY N/A 05/13/2013   Procedure: LUMBAR LAMINECTOMY/DECOMPRESSION MICRODISCECTOMY 1 LEVEL L3-4;  Surgeon: Javier Docker, MD;  Location: [facility];  Service: Orthopedics;  Laterality: N/A;   PANENDOSCOPY     TONSILLECTOMY     Family History  Problem Relation Age of Onset   Obesity Mother    Hypertension Son    Hypertension Maternal Grandmother    Obesity Maternal Grandmother    Cancer Maternal Grandmother 64       intestinal   Dementia Maternal Grandfather    Colon cancer Neg Hx    Esophageal cancer Neg Hx    Rectal cancer Neg Hx    Stomach cancer Neg Hx    Social History   Socioeconomic History   Marital status: Married    Spouse name: Not on file   Number of children: Not on file   Years of education: Not on file   Highest education level: Not on file  Occupational History   Not on file  Tobacco Use   Smoking status: Never   Smokeless tobacco: Never  Vaping Use   Vaping Use: Never used  Substance and Sexual Activity   Alcohol use: No   Drug use: Not Currently    Types: Hydrocodone    Comment: Pt states he does not use anymore.   Sexual activity: Yes  Other Topics Concern   Not on file  Social History Narrative   Not on file   Social Determinants of Health   Financial Resource Strain: Low Risk    Difficulty of Paying Living Expenses: Not hard at all  Food Insecurity: No Food Insecurity   Worried About Running Out of Food in the Last Year: Never true   Ran Out of Food in the Last Year: Never true  Transportation Needs: No Transportation Needs   Lack of Transportation (Medical): No   Lack of Transportation (Non-Medical): No  Physical Activity:  Insufficiently Active   Days of Exercise per Week: 4 days   Minutes of Exercise per Session: 30 min  Stress: No Stress Concern Present   Feeling of Stress : Not at all  Social Connections: Moderately Integrated   Frequency of Communication with Friends and Family: More than three times a week   Frequency of Social Gatherings with Friends and Family: More than three times a week   Attends Religious Services: More than 4 times per year   Active Member of [employer] or Organizations: No   Attends Banker Meetings: Never   Marital Status: Married    Tobacco Counseling Counseling given: Not Answered   Clinical Intake:  Pre-visit preparation completed: Yes  Pain : No/denies  pain     BMI - recorded: 24.52 Nutritional Status: BMI of 19-24  Normal Nutritional Risks: None Diabetes: No  How often do you need to have someone help you when you read instructions, pamphlets, or other written materials from your doctor or pharmacy?: 1 - Never  Diabetic?No  Interpreter Needed?: No  Information entered by :: Taylor Mcdonald   Activities of Daily Living In your present state of health, do you have any difficulty performing the following activities: 08/03/2021 02/08/2021  Hearing? N N  Vision? N N  Difficulty concentrating or making decisions? N N  Walking or climbing stairs? N N  Dressing or bathing? N N  Doing errands, shopping? N N  Preparing Food and eating ? N -  Using the Toilet? N -  In the past six months, have you accidently leaked urine? N -  Do you have problems with loss of bowel control? N -  Managing your Medications? N -  Managing your Finances? N -  Housekeeping or managing your Housekeeping? N -  Some recent data might be hidden    Patient Care Team: Bradd Canary, MD as PCP - General (Family Medicine) McKenzie, Mardene Celeste, MD as Consulting Physician (Urology)  Indicate any recent Medical Services you may have received from other than [facility] providers  in the past year (date may be approximate).     Assessment:   This is a routine wellness examination for [facility].  Hearing/Vision screen Hearing Screening - Comments:: No issues Vision Screening - Comments:: Last eye exam-several years ago  Dietary issues and exercise activities discussed: Current Exercise Habits: Home exercise routine, Type of exercise: strength training/weights;Other - see comments (Elliptical), Time (Minutes): 40, Frequency (Times/Week): 4, Weekly Exercise (Minutes/Week): 160, Intensity: Mild, Exercise limited by: None identified   Goals Addressed             This Visit's Progress    Patient Stated       Continue going to the gym & maintain current health       Depression Screen PHQ 2/9 Scores 08/03/2021 08/10/2020 06/16/2017  PHQ - 2 Score 0 0 1  PHQ- 9 Score - 1 6    Fall Risk Fall Risk  08/03/2021 08/10/2020 06/16/2017  Falls in the past year? 0 0 No  Number falls in past yr: 0 0 -  Injury with Fall? 0 0 -  Follow up Falls prevention discussed - -    FALL RISK PREVENTION PERTAINING TO THE HOME:  Any stairs in or around the home? Yes  If so, are there any without handrails? No  Home free of loose throw rugs in walkways, pet beds, electrical cords, etc? Yes  Adequate lighting in your home to reduce risk of falls? Yes   ASSISTIVE DEVICES UTILIZED TO PREVENT FALLS:  Life alert? No  Use of a cane, walker or w/c? No  Grab bars in the bathroom? No  Shower chair or bench in shower? No  Elevated toilet seat or a handicapped toilet? No   TIMED UP AND GO:  Was the test performed? No . Phone visit   Cognitive Function:Normal cognitive status assessed by this Nurse Health Advisor. No abnormalities found.          Immunizations Immunization History  Administered Date(s) Administered   Fluad Quad(high Dose 65+) 08/10/2020, 07/10/2021   Influenza Whole 07/25/2009, 05/12/2012   Influenza,inj,Quad PF,6+ Mos 08/18/2013, 06/16/2017, 06/22/2019    Influenza-Unspecified 04/28/2014, 06/27/2016, 05/28/2018   PFIZER(Purple Top)SARS-COV-2 Vaccination 09/23/2019,  10/27/2019   Td 03/06/2005, 05/15/2010   Tdap 08/10/2020    TDAP status: Up to date  Flu Vaccine status: Up to date  Pneumococcal vaccine status: Due, Education has been provided regarding the importance of this vaccine. Advised may receive this vaccine at local pharmacy or Health Dept. Aware to provide a copy of the vaccination record if obtained from local pharmacy or Health Dept. Verbalized acceptance and understanding.  Covid-19 vaccine status: Information provided on how to obtain vaccines.   Qualifies for Shingles Vaccine? Yes   Zostavax completed No   Shingrix Completed?: No.    Education has been provided regarding the importance of this vaccine. Patient has been advised to call insurance company to determine out of pocket expense if they have not yet received this vaccine. Advised may also receive vaccine at local pharmacy or Health Dept. Verbalized acceptance and understanding.  Screening Tests Health Maintenance  Topic Date Due   Pneumonia Vaccine 2165+ Years old (1 - PCV) Never done   Hepatitis C Screening  Never done   Zoster Vaccines- Shingrix (1 of 2) Never done   COVID-19 Vaccine (3 - Pfizer risk series) 11/24/2019   COLONOSCOPY (Pts [id number] Insurance coverage will need to be confirmed)  09/09/2026   TETANUS/TDAP  08/10/2030   INFLUENZA VACCINE  Completed   HPV VACCINES  Aged Out    Health Maintenance  Health Maintenance Due  Topic Date Due   Pneumonia Vaccine 7065+ Years old (1 - PCV) Never done   Hepatitis C Screening  Never done   Zoster Vaccines- Shingrix (1 of 2) Never done   COVID-19 Vaccine (3 - Pfizer risk series) 11/24/2019    Colorectal cancer screening: Type of screening: Colonoscopy. Completed 09/09/2016. Repeat every 10 years  Lung Cancer Screening: (Low Dose CT Chest recommended if Age 1-80 years, 30 pack-year currently smoking OR have  quit w/in 15years.) does not qualify.     Additional Screening:  Hepatitis C Screening: does qualify; Patient to discuss with PCP  Vision Screening: Recommended annual ophthalmology exams for early detection of glaucoma and other disorders of the eye. Is the patient up to date with their annual eye exam?  No  Who is the provider or what is the name of the office in which the patient attends annual eye exams? None-patient plans to schedule an appt soon   Dental Screening: Recommended annual dental exams for proper oral hygiene  Community Resource Referral / Chronic Care Management: CRR required this visit?  No   CCM required this visit?  No      Plan:     I have personally reviewed and noted the following in the patients chart:   Medical and social history Use of alcohol, tobacco or illicit drugs  Current medications and supplements including opioid prescriptions. Patient is not currently taking opioid prescriptions. Functional ability and status Nutritional status Physical activity Advanced directives List of other physicians Hospitalizations, surgeries, and ER visits in previous 12 months Vitals Screenings to include cognitive, depression, and falls Referrals and appointments  In addition, I have reviewed and discussed with patient certain preventive protocols, quality metrics, and best practice recommendations. A written personalized care plan for preventive services as well as general preventive health recommendations were provided to patient.   Due to this being a telephonic visit, the after visit summary with patients personalized plan was offered to patient via mail or my-chart.  Patient would like to access on my-chart.   Taylor RaiderMartha A Wanell Lorenzi, Mcdonald   4/5/40981/12/2021  Nurse Health Advisor  Nurse Notes: None

## 2021-08-03 NOTE — Patient Instructions (Signed)
[name] , Thank you for taking time to complete your Medicare Wellness Visit. I appreciate your ongoing commitment to your health goals. Please review the following plan we discussed and let me know if I can assist you in the future.   Screening recommendations/referrals: Colonoscopy: Completed 09/09/2016-Due 09/09/2026 Recommended yearly ophthalmology/optometry visit for glaucoma screening and checkup Recommended yearly dental visit for hygiene and checkup  Vaccinations: Influenza vaccine: Up to date Pneumococcal vaccine: Up to date Tdap vaccine: Up to date Shingles vaccine: Discuss with pharmacy   Covid-19: Booster available at the pharmacy  Advanced directives: Please bring a copy of Living Will and/or Healthcare Power of Attorney for your chart.   Conditions/risks identified: See problem list  Next appointment: Follow up in one year for your annual wellness visit. 08/06/2022 @ 9:40 (phone visit)  Preventive Care 67 Years and Older, Male Preventive care refers to lifestyle choices and visits with your health care provider that can promote health and wellness. What does preventive care include? A yearly physical exam. This is also called an annual well check. Dental exams once or twice a year. Routine eye exams. Ask your health care provider how often you should have your eyes checked. Personal lifestyle choices, including: Daily care of your teeth and gums. Regular physical activity. Eating a healthy diet. Avoiding tobacco and drug use. Limiting alcohol use. Practicing safe sex. Taking low doses of aspirin every day. Taking vitamin and mineral supplements as recommended by your health care provider. What happens during an annual well check? The services and screenings done by your health care provider during your annual well check will depend on your age, overall health, lifestyle risk factors, and family history of disease. Counseling  Your health care provider may ask you  questions about your: Alcohol use. Tobacco use. Drug use. Emotional well-being. Home and relationship well-being. Sexual activity. Eating habits. History of falls. Memory and ability to understand (cognition). Work and work Astronomer. Screening  You may have the following tests or measurements: Height, weight, and BMI. Blood pressure. Lipid and cholesterol levels. These may be checked every 5 years, or more frequently if you are over 48 years old. Skin check. Lung cancer screening. You may have this screening every year starting at age 67 if you have a 30-pack-year history of smoking and currently smoke or have quit within the past 15 years. Fecal occult blood test (FOBT) of the stool. You may have this test every year starting at age 67. Flexible sigmoidoscopy or colonoscopy. You may have a sigmoidoscopy every 5 years or a colonoscopy every 10 years starting at age 67. Prostate cancer screening. Recommendations will vary depending on your family history and other risks. Hepatitis C blood test. Hepatitis B blood test. Sexually transmitted disease (STD) testing. Diabetes screening. This is done by checking your blood sugar (glucose) after you have not eaten for a while (fasting). You may have this done every 1-3 years. Abdominal aortic aneurysm (AAA) screening. You may need this if you are a current or former smoker. Osteoporosis. You may be screened starting at age 67 if you are at high risk. Talk with your health care provider about your test results, treatment options, and if necessary, the need for more tests. Vaccines  Your health care provider may recommend certain vaccines, such as: Influenza vaccine. This is recommended every year. Tetanus, diphtheria, and acellular pertussis (Tdap, Td) vaccine. You may need a Td booster every 10 years. Zoster vaccine. You may need this after age 67. Pneumococcal  13-valent conjugate (PCV13) vaccine. One dose is recommended after age  67. Pneumococcal polysaccharide (PPSV23) vaccine. One dose is recommended after age 67. Talk to your health care provider about which screenings and vaccines you need and how often you need them. This information is not intended to replace advice given to you by your health care provider. Make sure you discuss any questions you have with your health care provider. Document Released: 08/11/2015 Document Revised: 04/03/2016 Document Reviewed: 05/16/2015 Elsevier Interactive Patient Education  2017 [employer] Prevention in the Home Falls can cause injuries. They can happen to people of all ages. There are many things you can do to make your home safe and to help prevent falls. What can I do on the outside of my home? Regularly fix the edges of walkways and driveways and fix any cracks. Remove anything that might make you trip as you walk through a door, such as a raised step or threshold. Trim any bushes or trees on the path to your home. Use bright outdoor lighting. Clear any walking paths of anything that might make someone trip, such as rocks or tools. Regularly check to see if handrails are loose or broken. Make sure that both sides of any steps have handrails. Any raised decks and porches should have guardrails on the edges. Have any leaves, snow, or ice cleared regularly. Use sand or salt on walking paths during winter. Clean up any spills in your garage right away. This includes oil or grease spills. What can I do in the bathroom? Use night lights. Install grab bars by the toilet and in the tub and shower. Do not use towel bars as grab bars. Use non-skid mats or decals in the tub or shower. If you need to sit down in the shower, use a plastic, non-slip stool. Keep the floor dry. Clean up any water that spills on the floor as soon as it happens. Remove soap buildup in the tub or shower regularly. Attach bath mats securely with double-sided non-slip rug tape. Do not have throw  rugs and other things on the floor that can make you trip. What can I do in the bedroom? Use night lights. Make sure that you have a light by your bed that is easy to reach. Do not use any sheets or blankets that are too big for your bed. They should not hang down onto the floor. Have a firm chair that has side arms. You can use this for support while you get dressed. Do not have throw rugs and other things on the floor that can make you trip. What can I do in the kitchen? Clean up any spills right away. Avoid walking on wet floors. Keep items that you use a lot in easy-to-reach places. If you need to reach something above you, use a strong step stool that has a grab bar. Keep electrical cords out of the way. Do not use floor polish or wax that makes floors slippery. If you must use wax, use non-skid floor wax. Do not have throw rugs and other things on the floor that can make you trip. What can I do with my stairs? Do not leave any items on the stairs. Make sure that there are handrails on both sides of the stairs and use them. Fix handrails that are broken or loose. Make sure that handrails are as long as the stairways. Check any carpeting to make sure that it is firmly attached to the stairs. Fix any carpet  that is loose or worn. Avoid having throw rugs at the top or bottom of the stairs. If you do have throw rugs, attach them to the floor with carpet tape. Make sure that you have a light switch at the top of the stairs and the bottom of the stairs. If you do not have them, ask someone to add them for you. What else can I do to help prevent falls? Wear shoes that: Do not have high heels. Have rubber bottoms. Are comfortable and fit you well. Are closed at the toe. Do not wear sandals. If you use a stepladder: Make sure that it is fully opened. Do not climb a closed stepladder. Make sure that both sides of the stepladder are locked into place. Ask someone to hold it for you, if  possible. Clearly mark and make sure that you can see: Any grab bars or handrails. First and last steps. Where the edge of each step is. Use tools that help you move around (mobility aids) if they are needed. These include: Canes. Walkers. Scooters. Crutches. Turn on the lights when you go into a dark area. Replace any light bulbs as soon as they burn out. Set up your furniture so you have a clear path. Avoid moving your furniture around. If any of your floors are uneven, fix them. If there are any pets around you, be aware of where they are. Review your medicines with your doctor. Some medicines can make you feel dizzy. This can increase your chance of falling. Ask your doctor what other things that you can do to help prevent falls. This information is not intended to replace advice given to you by your health care provider. Make sure you discuss any questions you have with your health care provider. Document Released: 05/11/2009 Document Revised: 12/21/2015 Document Reviewed: 08/19/2014 Elsevier Interactive Patient Education  2017 [employer].

## 2021-08-06 ENCOUNTER — Other Ambulatory Visit: Payer: Self-pay | Admitting: Family Medicine

## 2021-08-07 ENCOUNTER — Other Ambulatory Visit (HOSPITAL_BASED_OUTPATIENT_CLINIC_OR_DEPARTMENT_OTHER): Payer: Self-pay

## 2021-08-07 MED ORDER — ALLOPURINOL 100 MG PO TABS
200.0000 mg | ORAL_TABLET | Freq: Every day | ORAL | 3 refills | Status: DC
  Filled 2021-08-07: qty 180, 90d supply, fill #0
  Filled 2022-01-24: qty 180, 90d supply, fill #1

## 2021-08-29 ENCOUNTER — Other Ambulatory Visit (HOSPITAL_BASED_OUTPATIENT_CLINIC_OR_DEPARTMENT_OTHER): Payer: Self-pay

## 2021-08-29 MED FILL — Fluticasone Propionate Nasal Susp 50 MCG/ACT: NASAL | 90 days supply | Qty: 48 | Fill #2 | Status: AC

## 2021-09-17 ENCOUNTER — Other Ambulatory Visit (HOSPITAL_BASED_OUTPATIENT_CLINIC_OR_DEPARTMENT_OTHER): Payer: Self-pay

## 2021-09-20 ENCOUNTER — Other Ambulatory Visit: Payer: Self-pay | Admitting: Family Medicine

## 2021-09-20 ENCOUNTER — Other Ambulatory Visit (HOSPITAL_BASED_OUTPATIENT_CLINIC_OR_DEPARTMENT_OTHER): Payer: Self-pay

## 2021-09-20 MED ORDER — ATORVASTATIN CALCIUM 10 MG PO TABS
ORAL_TABLET | Freq: Every day | ORAL | 1 refills | Status: DC
  Filled 2021-09-20: qty 90, 90d supply, fill #0
  Filled 2021-12-20: qty 90, 90d supply, fill #1

## 2021-09-25 ENCOUNTER — Ambulatory Visit (INDEPENDENT_AMBULATORY_CARE_PROVIDER_SITE_OTHER): Payer: Medicare PPO | Admitting: Family Medicine

## 2021-09-25 ENCOUNTER — Encounter: Payer: Medicare PPO | Admitting: Family Medicine

## 2021-09-25 ENCOUNTER — Encounter: Payer: Self-pay | Admitting: Family Medicine

## 2021-09-25 ENCOUNTER — Other Ambulatory Visit (HOSPITAL_BASED_OUTPATIENT_CLINIC_OR_DEPARTMENT_OTHER): Payer: Self-pay

## 2021-09-25 VITALS — BP 133/85 | HR 64 | Ht 74.0 in | Wt 197.0 lb

## 2021-09-25 DIAGNOSIS — Z8709 Personal history of other diseases of the respiratory system: Secondary | ICD-10-CM | POA: Diagnosis not present

## 2021-09-25 DIAGNOSIS — Z0000 Encounter for general adult medical examination without abnormal findings: Secondary | ICD-10-CM | POA: Diagnosis not present

## 2021-09-25 DIAGNOSIS — I10 Essential (primary) hypertension: Secondary | ICD-10-CM | POA: Diagnosis not present

## 2021-09-25 DIAGNOSIS — H6121 Impacted cerumen, right ear: Secondary | ICD-10-CM | POA: Diagnosis not present

## 2021-09-25 LAB — COMPREHENSIVE METABOLIC PANEL
ALT: 27 U/L (ref 0–53)
AST: 23 U/L (ref 0–37)
Albumin: 4.5 g/dL (ref 3.5–5.2)
Alkaline Phosphatase: 56 U/L (ref 39–117)
BUN: 20 mg/dL (ref 6–23)
CO2: 30 mEq/L (ref 19–32)
Calcium: 10.3 mg/dL (ref 8.4–10.5)
Chloride: 100 mEq/L (ref 96–112)
Creatinine, Ser: 1.23 mg/dL (ref 0.40–1.50)
GFR: 60.91 mL/min (ref >60.00)
Glucose, Bld: 102 mg/dL — ABNORMAL HIGH (ref 70–99)
Potassium: 4.9 mEq/L (ref 3.5–5.1)
Sodium: 136 mEq/L (ref 135–145)
Total Bilirubin: 1 mg/dL (ref 0.2–1.2)
Total Protein: 6.9 g/dL (ref 6.0–8.3)

## 2021-09-25 LAB — LIPID PANEL
Cholesterol: 147 mg/dL (ref 0–200)
HDL: 49.1 mg/dL (ref >39.00)
LDL Cholesterol: 72 mg/dL (ref 0–99)
NonHDL: 98.08
Total CHOL/HDL Ratio: 3
Triglycerides: 132 mg/dL (ref 0.0–149.0)
VLDL: 26.4 mg/dL (ref 0.0–40.0)

## 2021-09-25 LAB — TSH: TSH: 0.54 u[IU]/mL (ref 0.35–5.50)

## 2021-09-25 LAB — CBC
HCT: 48.3 % (ref 39.0–52.0)
Hemoglobin: 16.2 g/dL (ref 13.0–17.0)
MCHC: 33.6 g/dL (ref 30.0–36.0)
MCV: 85.9 fl (ref 78.0–100.0)
Platelets: 263 10*3/uL (ref 150.0–400.0)
RBC: 5.62 Mil/uL (ref 4.22–5.81)
RDW: 15.3 % (ref 11.5–15.5)
WBC: 7.5 10*3/uL (ref 4.0–10.5)

## 2021-09-25 LAB — HEMOGLOBIN A1C: Hgb A1c MFr Bld: 5.5 % (ref 4.6–6.5)

## 2021-09-25 MED ORDER — CEFDINIR 300 MG PO CAPS
300.0000 mg | ORAL_CAPSULE | Freq: Two times a day (BID) | ORAL | 1 refills | Status: DC
  Filled 2021-09-25: qty 20, 10d supply, fill #0

## 2021-09-25 NOTE — Progress Notes (Signed)
BP 133/85    Pulse 64    Ht 6\' 2"  (1.88 m)    Wt 197 lb (89.4 kg)    BMI 25.29 kg/m    Subjective:    Patient ID: Taylor Mcdonald, male    DOB: June 17, 1956, 66 y.o.   MRN: [id number]  HPI: Taylor Mcdonald is a 66 y.o. male presenting on 09/25/2021 for comprehensive medical examination. Current medical complaints include:none  Following up on HTN and fatty liver disease. No complaints or concerns today. Compliant with all meds. Denies chest pain, dyspnea, palpitations, edema, fever, GI/GU symptoms. Following with urology for ED and hypogonadism with IM testosterone 200 mg every 2 weeks.   He currently lives with: wife  Interim Problems from his last visit: no  He reports regular vision exams q1-5y: no He reports regular dental exams q 1m: yes His diet consists of: low sodium, low carb   He endorses exercise and/or activity of: [employer] 3x/week He works at: retired 11[employer]   He denies ETOH use  He denies nictoine use  He denies illegal substance use   He is currently sexually active with wife He denies concerns today about STI  He denies concerns about skin changes today:  He denies concerns about bowel changes today:  He denies concerns about bladder changes today:   Depression Screen done today and results listed below:  Depression screen [facility] 2/9 09/25/2021 08/03/2021 08/10/2020 06/16/2017  Decreased Interest 0 0 0 0  Down, Depressed, Hopeless 0 0 0 1  PHQ - 2 Score 0 0 0 1  Altered sleeping - - 1 2  Tired, decreased energy - - 0 1  Change in appetite - - 0 1  Feeling bad or failure about yourself  - - 0 0  Trouble concentrating - - 0 0  Moving slowly or fidgety/restless - - 0 1  Suicidal thoughts - - 0 0  PHQ-9 Score - - 1 6  Difficult doing work/chores - - - Not difficult at all    The patient does not have a history of falls.      Past Medical History:  Past Medical History:  Diagnosis Date   Allergy    Anxiety 03/04/2012   Fatigue    GERD (gastroesophageal  reflux disease)    Gout    Hyperglycemia    Hyperlipidemia    Hypertension    Insomnia    Internal nasal lesion 07/04/2016   Low testosterone    Lower back pain 09/09/2012   Migraine    Pain of left heel 10/01/2011   Preventative health care 07/24/2012   RLS (restless legs syndrome)    Sinusitis 03/04/2012   Varicose veins of both lower extremities 12/12/2016   Vasovagal near syncope 04/30/2013   Vitamin D deficiency 12/23/2011    Surgical History:  Past Surgical History:  Procedure Laterality Date   LUMBAR LAMINECTOMY/DECOMPRESSION MICRODISCECTOMY N/A 05/13/2013   Procedure: LUMBAR LAMINECTOMY/DECOMPRESSION MICRODISCECTOMY 1 LEVEL L3-4;  Surgeon: 05/15/2013, MD;  Location: [facility];  Service: Orthopedics;  Laterality: N/A;   PANENDOSCOPY     TONSILLECTOMY      Medications:  Current Outpatient Medications on File Prior to Visit  Medication Sig   allopurinol (ZYLOPRIM) 100 MG tablet Take 2 tablets (200 mg total) by mouth daily.   anastrozole (ARIMIDEX) 1 MG tablet Take 1/2 tablet (0.5 mg total) by mouth daily.   aspirin 81 MG tablet Take 1 tablet (81 mg total) by mouth daily. Resume 4 days  post-op   atorvastatin (LIPITOR) 10 MG tablet TAKE 1 TABLET BY MOUTH ONCE DAILY   Fish Oil-Cholecalciferol (FISH OIL + D3) 1200-1000 MG-UNIT CAPS Take 1 capsule by mouth daily.   fluticasone (FLONASE) 50 MCG/ACT nasal spray PLACE 2 SPRAYS INTO THE NOSE DAILY.   lisinopril (ZESTRIL) 20 MG tablet TAKE 1 TABLET (20 MG TOTAL) BY MOUTH DAILY.   omeprazole (PRILOSEC) 20 MG capsule Take 1 capsule (20 mg total) by mouth daily.   OVER THE COUNTER MEDICATION 12 hr decongestant 120mg    propranolol (INDERAL) 40 MG tablet TAKE 1 TABLET (40 MG TOTAL) BY MOUTH DAILY.   sildenafil (REVATIO) 20 MG tablet TAKE 3 TABLETS BY MOUTH AS NEEDED   testosterone cypionate (DEPOTESTOSTERONE CYPIONATE) 200 MG/ML injection Inject 1 mL (200 mg total) into the skin every 14 (fourteen) days.   zolpidem (AMBIEN) 10 MG tablet  TAKE 1/2 TO 1 TABLET BY MOUTH AT BEDTIME AS NEEDED FOR SLEEP   No current facility-administered medications on file prior to visit.    Allergies:  No Known Allergies  Social History:  Social History   Socioeconomic History   Marital status: Married    Spouse name: Not on file   Number of children: Not on file   Years of education: Not on file   Highest education level: Not on file  Occupational History   Not on file  Tobacco Use   Smoking status: Never   Smokeless tobacco: Never  Vaping Use   Vaping Use: Never used  Substance and Sexual Activity   Alcohol use: No   Drug use: Not Currently    Types: Hydrocodone    Comment: Pt states he does not use anymore.   Sexual activity: Yes  Other Topics Concern   Not on file  Social History Narrative   Not on file   Social Determinants of Health   Financial Resource Strain: Low Risk    Difficulty of Paying Living Expenses: Not hard at all  Food Insecurity: No Food Insecurity   Worried About Running Out of Food in the Last Year: Never true   Ran Out of Food in the Last Year: Never true  Transportation Needs: No Transportation Needs   Lack of Transportation (Medical): No   Lack of Transportation (Non-Medical): No  Physical Activity: Insufficiently Active   Days of Exercise per Week: 4 days   Minutes of Exercise per Session: 30 min  Stress: No Stress Concern Present   Feeling of Stress : Not at all  Social Connections: Moderately Integrated   Frequency of Communication with Friends and Family: More than three times a week   Frequency of Social Gatherings with Friends and Family: More than three times a week   Attends Religious Services: More than 4 times per year   Active Member of or Organizations: No   Attends [employer]: Never   Marital Status: Married  Engineer, structural Violence: Not At Risk   Fear of Current or Ex-Partner: No   Emotionally Abused: No   Physically Abused: No   Sexually  Abused: No   Social History   Tobacco Use  Smoking Status Never  Smokeless Tobacco Never   Social History   Substance and Sexual Activity  Alcohol Use No    Family History:  Family History  Problem Relation Age of Onset   Obesity Mother    Hypertension Son    Hypertension Maternal Grandmother    Obesity Maternal Grandmother    Cancer Maternal Grandmother 83  intestinal   Dementia Maternal Grandfather    Colon cancer Neg Hx    Esophageal cancer Neg Hx    Rectal cancer Neg Hx    Stomach cancer Neg Hx     Past medical history, surgical history, medications, allergies, family history and social history reviewed with patient today and changes made to appropriate areas of the chart.   All ROS negative except what is listed above and in the HPI.      Objective:    BP 133/85    Pulse 64    Ht 6\' 2"  (1.88 m)    Wt 197 lb (89.4 kg)    BMI 25.29 kg/m   Wt Readings from Last 3 Encounters:  09/25/21 197 lb (89.4 kg)  08/03/21 191 lb (86.6 kg)  02/08/21 191 lb 6.4 oz (86.8 kg)    Physical Exam Vitals reviewed.  Constitutional:      Appearance: Normal appearance.  HENT:     Head: Normocephalic and atraumatic.     Right Ear: There is impacted cerumen.     Left Ear: Tympanic membrane normal.     Nose: Nose normal.     Mouth/Throat:     Mouth: Mucous membranes are moist.     Pharynx: Oropharynx is clear.  Eyes:     Extraocular Movements: Extraocular movements intact.     Conjunctiva/sclera: Conjunctivae normal.     Pupils: Pupils are equal, round, and reactive to light.  Cardiovascular:     Rate and Rhythm: Normal rate and regular rhythm.     Pulses: Normal pulses.     Heart sounds: Normal heart sounds.  Pulmonary:     Effort: Pulmonary effort is normal.     Breath sounds: Normal breath sounds.  Abdominal:     General: Abdomen is flat. Bowel sounds are normal. There is no distension.     Palpations: Abdomen is soft. There is no mass.     Tenderness: There is  no abdominal tenderness. There is no right CVA tenderness, left CVA tenderness, guarding or rebound.  Musculoskeletal:        General: Normal range of motion.     Cervical back: Normal range of motion and neck supple.  Skin:    General: Skin is warm and dry.     Capillary Refill: Capillary refill takes less than 2 seconds.  Neurological:     General: No focal deficit present.     Mental Status: He is alert and oriented to person, place, and time. Mental status is at baseline.  Psychiatric:        Mood and Affect: Mood normal.        Behavior: Behavior normal.        Thought Content: Thought content normal.        Judgment: Judgment normal.       Results for orders placed or performed in visit on 05/16/21  Urinalysis, Routine w reflex microscopic  Result Value Ref Range   Specific Gravity, UA 1.010 1.005 - 1.030   pH, UA 7.0 5.0 - 7.5   Color, UA Yellow Yellow   Appearance Ur Clear Clear   Leukocytes,UA Negative Negative   Protein,UA Negative Negative/Trace   Glucose, UA Negative Negative   Ketones, UA Negative Negative   RBC, UA Negative Negative   Bilirubin, UA Negative Negative   Urobilinogen, Ur 0.2 0.2 - 1.0 mg/dL   Nitrite, UA Negative Negative   Microscopic Examination Comment       Assessment & Plan:  Problem List Items Addressed This Visit       Cardiovascular and Mediastinum   Hypertension   Relevant Orders   CBC   Comprehensive metabolic panel   Lipid panel   TSH   Hemoglobin A1c   Other Visit Diagnoses     History of chronic sinusitis    -  Primary   Relevant Medications   cefdinir (OMNICEF) 300 MG capsule   Annual physical exam       Relevant Orders   CBC   Comprehensive metabolic panel   Lipid panel   TSH   Hemoglobin A1c       Right ear with cerumen impaction- pt declined irrigation today. Recommended OTC Debrox drops.    LABORATORY TESTING:  Health maintenance labs ordered today as discussed above.  - STI testing: deferred  The  natural history of prostate cancer and ongoing controversy regarding screening and potential treatment outcomes of prostate cancer has been discussed with the patient. The meaning of a false positive PSA and a false negative PSA has been discussed. He indicates understanding of the limitations of this screening test and wishes NOT to proceed with screening PSA testing.  IMMUNIZATIONS:   - Tdap: Tetanus vaccination status reviewed: last tetanus booster within 10 years. - Influenza: Up to date - Pneumovax: Not applicable - Prevnar: Refused - HPV: Not applicable - Shingrix vaccine: Refused - COVID-19: Given elsewhere  SCREENING: - Colonoscopy: Up to date ; due 2028 Discussed with patient purpose of the colonoscopy is to detect colon cancer at curable precancerous or early stages  - AAA Screening: Not applicable  -Hearing Test: Not applicable  -Spirometry: Not applicable  - Lung Cancer Screening: Not applicable    PATIENT COUNSELING:    Sexuality: Discussed sexually transmitted diseases, partner selection, use of condoms, avoidance of unintended pregnancy, and contraceptive alternatives.   I discussed with the patient that most people either abstain from alcohol or drink within safe limits (<=14/week and <=4 drinks/occasion for males, <=7/weeks and <= 3 drinks/occasion for females) and that the risk for alcohol disorders and other health effects rises proportionally with the number of drinks per week and how often a drinker exceeds daily limits.  Discussed cessation/primary prevention of drug use and availability of treatment for abuse.   Diet: Encouraged to adjust caloric intake to maintain or achieve ideal body weight, to reduce intake of dietary saturated fat and total fat, to limit sodium intake by avoiding high sodium foods and not adding table salt, and to maintain adequate dietary potassium and calcium preferably from fresh fruits, vegetables, and low-fat dairy products.     Emphasized the importance of regular exercise  Injury prevention: Discussed safety belts, safety helmets, smoke detector, smoking near bedding or upholstery.   Dental health: Discussed importance of regular tooth brushing, flossing, and dental visits.   Follow up plan:  Return in about 6 months (around 03/25/2022) for routine w PCP .  Lollie Marrowaylor B. Reola CalkinsBeck, DNP, FNP-C

## 2021-09-27 ENCOUNTER — Other Ambulatory Visit (HOSPITAL_BASED_OUTPATIENT_CLINIC_OR_DEPARTMENT_OTHER): Payer: Self-pay

## 2021-09-27 ENCOUNTER — Telehealth (INDEPENDENT_AMBULATORY_CARE_PROVIDER_SITE_OTHER): Payer: Medicare PPO | Admitting: Family

## 2021-09-27 ENCOUNTER — Encounter: Payer: Self-pay | Admitting: Family

## 2021-09-27 VITALS — Ht 74.0 in | Wt 192.0 lb

## 2021-09-27 DIAGNOSIS — U071 COVID-19: Secondary | ICD-10-CM

## 2021-09-27 DIAGNOSIS — Z8709 Personal history of other diseases of the respiratory system: Secondary | ICD-10-CM | POA: Diagnosis not present

## 2021-09-27 MED ORDER — CEFDINIR 300 MG PO CAPS
300.0000 mg | ORAL_CAPSULE | Freq: Two times a day (BID) | ORAL | 0 refills | Status: DC
  Filled 2021-09-27 – 2021-10-05 (×2): qty 20, 10d supply, fill #0

## 2021-09-27 MED ORDER — MOLNUPIRAVIR EUA 200MG CAPSULE
4.0000 | ORAL_CAPSULE | Freq: Two times a day (BID) | ORAL | 0 refills | Status: AC
  Filled 2021-09-27: qty 40, 5d supply, fill #0

## 2021-09-27 NOTE — Progress Notes (Signed)
?Taylor Mcdonald is a 66 y.o. male with the following history as recorded in EpicCare:  ?Patient Active Problem List  ? Diagnosis Date Noted  ? Fatty liver 08/13/2020  ? Renal cyst 08/13/2020  ? Internal hemorrhoid 08/10/2020  ? Diverticulosis 08/10/2020  ? Hypogonadism in male 05/05/2020  ? Erectile dysfunction 12/25/2017  ? Varicose veins of both lower extremities 12/12/2016  ? Internal nasal lesion 07/04/2016  ? Nocturia 08/25/2014  ? Screen for colon cancer 08/25/2014  ? Spinal stenosis, lumbar region, with neurogenic claudication 05/13/2013  ? Vasovagal near syncope 04/30/2013  ? Lower back pain 09/09/2012  ? Preventative health care 07/24/2012  ? Sun-damaged skin 07/24/2012  ? Anxiety 03/04/2012  ? Vitamin D deficiency 12/23/2011  ? Pain of left heel 10/01/2011  ? Gout   ? Hypertension   ? Testosterone deficiency   ? Migraine   ? Allergic state   ? RLS (restless legs syndrome)   ? Insomnia   ? Hyperlipidemia   ? GERD (gastroesophageal reflux disease)   ? Hyperglycemia   ? Fatigue   ? ARTHRITIS 07/21/2007  ?  ?Current Outpatient Medications  ?Medication Sig Dispense Refill  ? allopurinol (ZYLOPRIM) 100 MG tablet Take 2 tablets (200 mg total) by mouth daily. 180 tablet 3  ? anastrozole (ARIMIDEX) 1 MG tablet Take 1/2 tablet (0.5 mg total) by mouth daily. 45 tablet 3  ? aspirin 81 MG tablet Take 1 tablet (81 mg total) by mouth daily. Resume 4 days post-op 30 tablet   ? atorvastatin (LIPITOR) 10 MG tablet TAKE 1 TABLET BY MOUTH ONCE DAILY 90 tablet 1  ? Fish Oil-Cholecalciferol (FISH OIL + D3) 1200-1000 MG-UNIT CAPS Take 1 capsule by mouth daily.    ? fluticasone (FLONASE) 50 MCG/ACT nasal spray PLACE 2 SPRAYS INTO THE NOSE DAILY. 48 g 3  ? lisinopril (ZESTRIL) 20 MG tablet TAKE 1 TABLET (20 MG TOTAL) BY MOUTH DAILY. 90 tablet 1  ? molnupiravir EUA (LAGEVRIO) 200 mg CAPS capsule Take 4 capsules (800 mg total) by mouth 2 (two) times daily for 5 days. 40 capsule 0  ? omeprazole (PRILOSEC) 20 MG capsule Take 1  capsule (20 mg total) by mouth daily. 90 capsule 1  ? OVER THE COUNTER MEDICATION 12 hr decongestant 120mg     ? propranolol (INDERAL) 40 MG tablet TAKE 1 TABLET (40 MG TOTAL) BY MOUTH DAILY. 90 tablet 1  ? sildenafil (REVATIO) 20 MG tablet TAKE 3 TABLETS BY MOUTH AS NEEDED 90 tablet 0  ? testosterone cypionate (DEPOTESTOSTERONE CYPIONATE) 200 MG/ML injection Inject 1 mL (200 mg total) into the skin every 14 (fourteen) days. 10 mL 3  ? zolpidem (AMBIEN) 10 MG tablet TAKE 1/2 TO 1 TABLET BY MOUTH AT BEDTIME AS NEEDED FOR SLEEP 30 tablet 5  ? cefdinir (OMNICEF) 300 MG capsule Take 1 capsule (300 mg total) by mouth 2 (two) times daily. 20 capsule 0  ? ?No current facility-administered medications for this visit.  ?  ?Allergies: Patient has no known allergies.  ?Past Medical History:  ?Diagnosis Date  ? Allergy   ? Anxiety 03/04/2012  ? Fatigue   ? GERD (gastroesophageal reflux disease)   ? Gout   ? Hyperglycemia   ? Hyperlipidemia   ? Hypertension   ? Insomnia   ? Internal nasal lesion 07/04/2016  ? Low testosterone   ? Lower back pain 09/09/2012  ? Migraine   ? Pain of left heel 10/01/2011  ? Preventative health care 07/24/2012  ? RLS (restless legs  syndrome)   ? Sinusitis 03/04/2012  ? Varicose veins of both lower extremities 12/12/2016  ? Vasovagal near syncope 04/30/2013  ? Vitamin D deficiency 12/23/2011  ?  ?Past Surgical History:  ?Procedure Laterality Date  ? LUMBAR LAMINECTOMY/DECOMPRESSION MICRODISCECTOMY N/A 05/13/2013  ? Procedure: LUMBAR LAMINECTOMY/DECOMPRESSION MICRODISCECTOMY 1 LEVEL L3-4;  Surgeon: Javier Docker, MD;  Location: [facility];  Service: Orthopedics;  Laterality: N/A;  ? PANENDOSCOPY    ? TONSILLECTOMY    ?  ?Family History  ?Problem Relation Age of Onset  ? Obesity Mother   ? Hypertension Son   ? Hypertension Maternal Grandmother   ? Obesity Maternal Grandmother   ? Cancer Maternal Grandmother 50  ?     intestinal  ? Dementia Maternal Grandfather   ? Colon cancer Neg Hx   ? Esophageal cancer Neg Hx   ?  Rectal cancer Neg Hx   ? Stomach cancer Neg Hx   ?  ?Social History  ? ?Tobacco Use  ? Smoking status: Never  ? Smokeless tobacco: Never  ?Substance Use Topics  ? Alcohol use: No  ?  ?Subjective:  ? ? ?I connected with Taylor Mcdonald on 09/27/21 at  9:40 AM EST by a telephone call and verified that I am speaking with the correct person using two identifiers. ?  ?I discussed the limitations of evaluation and management by telemedicine and the availability of in person appointments. The patient expressed understanding and agreed to proceed. Provider in office/ patient is at home; provider and patient are only 2 people on telephone call.  ? ? ?Complaining of headache/ head congestion x 3 days; originally thought "it was just my sinuses." Took a COVID test today- tested positive; no chest pain or shortness of breath; is concerned about developing secondary sinus infection;  ? ? ? ?Objective:  ?Vitals:  ? 09/27/21 0913  ?Weight: 192 lb (87.1 kg)  ?Height: 6\' 2"  (1.88 m)  ?  ?Lungs: Respirations unlabored;  ?Neurologic: Alert and oriented; speech intact;  ? ?Assessment:  ?1. COVID-19   ?2. History of chronic sinusitis   ?  ?Plan:  ?Rx for Molnupiravir- take as directed; encouraged rest, fluids and symptomatic treatment; ?Due to history of chronic sinus issues, did call in refill on Omnicef which he will fill only if needed; ?Follow up worse, no better;  ? ?Time spent 11 minutes ? ?No follow-ups on file.  ?No orders of the defined types were placed in this encounter. ?  ?Requested Prescriptions  ? ?Signed Prescriptions Disp Refills  ? molnupiravir EUA (LAGEVRIO) 200 mg CAPS capsule 40 capsule 0  ?  Sig: Take 4 capsules (800 mg total) by mouth 2 (two) times daily for 5 days.  ? cefdinir (OMNICEF) 300 MG capsule 20 capsule 0  ?  Sig: Take 1 capsule (300 mg total) by mouth 2 (two) times daily.  ?  ? ?

## 2021-10-05 ENCOUNTER — Other Ambulatory Visit (HOSPITAL_BASED_OUTPATIENT_CLINIC_OR_DEPARTMENT_OTHER): Payer: Self-pay

## 2021-10-22 ENCOUNTER — Other Ambulatory Visit (HOSPITAL_BASED_OUTPATIENT_CLINIC_OR_DEPARTMENT_OTHER): Payer: Self-pay

## 2021-11-08 ENCOUNTER — Other Ambulatory Visit: Payer: Medicare PPO

## 2021-11-08 DIAGNOSIS — E291 Testicular hypofunction: Secondary | ICD-10-CM | POA: Diagnosis not present

## 2021-11-09 ENCOUNTER — Other Ambulatory Visit: Payer: Medicare PPO

## 2021-11-10 LAB — CBC WITH DIFFERENTIAL
Basophils Absolute: 0.1 10*3/uL (ref 0.0–0.2)
Basos: 1 %
EOS (ABSOLUTE): 0.2 10*3/uL (ref 0.0–0.4)
Eos: 3 %
Hematocrit: 52.2 % — ABNORMAL HIGH (ref 37.5–51.0)
Hemoglobin: 17.2 g/dL (ref 13.0–17.7)
Immature Grans (Abs): 0 10*3/uL (ref 0.0–0.1)
Immature Granulocytes: 0 %
Lymphocytes Absolute: 2.3 10*3/uL (ref 0.7–3.1)
Lymphs: 34 %
MCH: 29.3 pg (ref 26.6–33.0)
MCHC: 33 g/dL (ref 31.5–35.7)
MCV: 89 fL (ref 79–97)
Monocytes Absolute: 0.7 10*3/uL (ref 0.1–0.9)
Monocytes: 10 %
Neutrophils Absolute: 3.6 10*3/uL (ref 1.4–7.0)
Neutrophils: 52 %
RBC: 5.88 x10E6/uL — ABNORMAL HIGH (ref 4.14–5.80)
RDW: 13.7 % (ref 11.6–15.4)
WBC: 6.9 10*3/uL (ref 3.4–10.8)

## 2021-11-10 LAB — COMPREHENSIVE METABOLIC PANEL
ALT: 26 IU/L (ref 0–44)
AST: 24 IU/L (ref 0–40)
Albumin/Globulin Ratio: 2.2 (ref 1.2–2.2)
Albumin: 4.6 g/dL (ref 3.8–4.8)
Alkaline Phosphatase: 64 IU/L (ref 44–121)
BUN/Creatinine Ratio: 17 (ref 10–24)
BUN: 17 mg/dL (ref 8–27)
Bilirubin Total: 0.8 mg/dL (ref 0.0–1.2)
CO2: 23 mmol/L (ref 20–29)
Calcium: 9.8 mg/dL (ref 8.6–10.2)
Chloride: 99 mmol/L (ref 96–106)
Creatinine, Ser: 1.01 mg/dL (ref 0.76–1.27)
Globulin, Total: 2.1 g/dL (ref 1.5–4.5)
Glucose: 102 mg/dL — ABNORMAL HIGH (ref 70–99)
Potassium: 4.9 mmol/L (ref 3.5–5.2)
Sodium: 140 mmol/L (ref 134–144)
Total Protein: 6.7 g/dL (ref 6.0–8.5)
eGFR: 82 mL/min/{1.73_m2} (ref >59)

## 2021-11-10 LAB — TESTOSTERONE,FREE AND TOTAL
Testosterone, Free: 2.7 pg/mL — ABNORMAL LOW (ref 6.6–18.1)
Testosterone: 176 ng/dL — ABNORMAL LOW (ref 264–916)

## 2021-11-14 ENCOUNTER — Ambulatory Visit (INDEPENDENT_AMBULATORY_CARE_PROVIDER_SITE_OTHER): Payer: Medicare PPO | Admitting: Urology

## 2021-11-14 ENCOUNTER — Encounter: Payer: Self-pay | Admitting: Urology

## 2021-11-14 ENCOUNTER — Other Ambulatory Visit (HOSPITAL_BASED_OUTPATIENT_CLINIC_OR_DEPARTMENT_OTHER): Payer: Self-pay

## 2021-11-14 VITALS — BP 112/80 | HR 97

## 2021-11-14 DIAGNOSIS — E291 Testicular hypofunction: Secondary | ICD-10-CM | POA: Diagnosis not present

## 2021-11-14 DIAGNOSIS — N5201 Erectile dysfunction due to arterial insufficiency: Secondary | ICD-10-CM

## 2021-11-14 LAB — URINALYSIS, ROUTINE W REFLEX MICROSCOPIC
Bilirubin, UA: NEGATIVE
Glucose, UA: NEGATIVE
Ketones, UA: NEGATIVE
Leukocytes,UA: NEGATIVE
Nitrite, UA: NEGATIVE
Protein,UA: NEGATIVE
RBC, UA: NEGATIVE
Specific Gravity, UA: 1.02 (ref 1.005–1.030)
Urobilinogen, Ur: 0.2 mg/dL (ref 0.2–1.0)
pH, UA: 7 (ref 5.0–7.5)

## 2021-11-14 MED ORDER — SILDENAFIL CITRATE 20 MG PO TABS: 20.0000 mg | ORAL_TABLET | ORAL | 3 refills | Status: DC | PRN

## 2021-11-14 MED ORDER — TESTOSTERONE CYPIONATE 200 MG/ML IM SOLN
200.0000 mg | INTRAMUSCULAR | 3 refills | Status: DC
Start: 2021-11-14 — End: 2022-05-15
  Filled 2021-11-14: qty 5, 70d supply, fill #0
  Filled 2022-02-04: qty 5, 70d supply, fill #1
  Filled 2022-04-17: qty 5, 70d supply, fill #2

## 2021-11-14 NOTE — Patient Instructions (Signed)

## 2021-11-14 NOTE — Progress Notes (Signed)
? ?11/14/2021 ?12:16 PM  ? ?[name] ?10/27/1955 ?[id number] ? ?Referring provider: Bradd Canary, MD ?[address] ?[address] ?[place],  [place] [address] ? ?Followup hypogonadism and erectile dysfunction ? ? ?HPI: ?[name] is a 66yo here for followup for hypogonadism and erectile dysfunction. Testosterone 176 but it had been 3 weeks since he injected his testosterone. Hgb 17.2. CMP normal.  He injects 200mg  every 2 weeks.  ?He uses 60mg  sildenafil for his erectile dysfunction with good results ? ?PMH: ?Past Medical History:  ?Diagnosis Date  ? Allergy   ? Anxiety 03/04/2012  ? Fatigue   ? GERD (gastroesophageal reflux disease)   ? Gout   ? Hyperglycemia   ? Hyperlipidemia   ? Hypertension   ? Insomnia   ? Internal nasal lesion 07/04/2016  ? Low testosterone   ? Lower back pain 09/09/2012  ? Migraine   ? Pain of left heel 10/01/2011  ? Preventative health care 07/24/2012  ? RLS (restless legs syndrome)   ? Sinusitis 03/04/2012  ? Varicose veins of both lower extremities 12/12/2016  ? Vasovagal near syncope 04/30/2013  ? Vitamin D deficiency 12/23/2011  ? ? ?Surgical History: ?Past Surgical History:  ?Procedure Laterality Date  ? LUMBAR LAMINECTOMY/DECOMPRESSION MICRODISCECTOMY N/A 05/13/2013  ? Procedure: LUMBAR LAMINECTOMY/DECOMPRESSION MICRODISCECTOMY 1 LEVEL L3-4;  Surgeon: 12/25/2011, MD;  Location: [facility];  Service: Orthopedics;  Laterality: N/A;  ? PANENDOSCOPY    ? TONSILLECTOMY    ? ? ?Home Medications:  ?Allergies as of 11/14/2021   ?No Known Allergies ?  ? ?  ?Medication List  ?  ? ?  ? Accurate as of November 14, 2021 12:16 PM. If you have any questions, ask your nurse or doctor.  ?  ?  ? ?  ? ?allopurinol 100 MG tablet ?Commonly known as: ZYLOPRIM ?Take 2 tablets (200 mg total) by mouth daily. ?  ?anastrozole 1 MG tablet ?Commonly known as: ARIMIDEX ?Take 1/2 tablet (0.5 mg total) by mouth daily. ?  ?aspirin 81 MG tablet ?Take 1 tablet (81 mg total) by mouth daily. Resume 4 days post-op ?   ?atorvastatin 10 MG tablet ?Commonly known as: LIPITOR ?TAKE 1 TABLET BY MOUTH ONCE DAILY ?  ?cefdinir 300 MG capsule ?Commonly known as: OMNICEF ?Take 1 capsule (300 mg total) by mouth 2 (two) times daily. ?  ?Fish Oil + D3 1200-1000 MG-UNIT Caps ?Take 1 capsule by mouth daily. ?  ?fluticasone 50 MCG/ACT nasal spray ?Commonly known as: FLONASE ?PLACE 2 SPRAYS INTO THE NOSE DAILY. ?  ?lisinopril 20 MG tablet ?Commonly known as: ZESTRIL ?TAKE 1 TABLET (20 MG TOTAL) BY MOUTH DAILY. ?  ?omeprazole 20 MG capsule ?Commonly known as: PRILOSEC ?Take 1 capsule (20 mg total) by mouth daily. ?  ?OVER THE COUNTER MEDICATION ?12 hr decongestant 120mg  ?  ?propranolol 40 MG tablet ?Commonly known as: INDERAL ?TAKE 1 TABLET (40 MG TOTAL) BY MOUTH DAILY. ?  ?sildenafil 20 MG tablet ?Commonly known as: REVATIO ?TAKE 3 TABLETS BY MOUTH AS NEEDED ?  ?testosterone cypionate 200 MG/ML injection ?Commonly known as: DEPOTESTOSTERONE CYPIONATE ?Inject 1 mL (200 mg total) into the skin every 14 (fourteen) days. ?  ?zolpidem 10 MG tablet ?Commonly known as: AMBIEN ?TAKE 1/2 TO 1 TABLET BY MOUTH AT BEDTIME AS NEEDED FOR SLEEP ?  ? ?  ? ? ?Allergies: No Known Allergies ? ?Family History: ?Family History  ?Problem Relation Age of Onset  ? Obesity Mother   ? Hypertension  Son   ? Hypertension Maternal Grandmother   ? Obesity Maternal Grandmother   ? Cancer Maternal Grandmother 50  ?     intestinal  ? Dementia Maternal Grandfather   ? Colon cancer Neg Hx   ? Esophageal cancer Neg Hx   ? Rectal cancer Neg Hx   ? Stomach cancer Neg Hx   ? ? ?Social History:  reports that he has never smoked. He has never used smokeless tobacco. He reports that he does not currently use drugs after having used the following drugs: Hydrocodone. He reports that he does not drink alcohol. ? ?ROS: ?All other review of systems were reviewed and are negative except what is noted above in HPI ? ?Physical Exam: ?BP 112/80   Pulse 97   ?Constitutional:  Alert and oriented,  No acute distress. ?HEENT: [place] AT, moist mucus membranes.  Trachea midline, no masses. ?Cardiovascular: No clubbing, cyanosis, or edema. ?Respiratory: Normal respiratory effort, no increased work of breathing. ?GI: Abdomen is soft, nontender, nondistended, no abdominal masses ?GU: No CVA tenderness.  ?Lymph: No cervical or inguinal lymphadenopathy. ?Skin: No rashes, bruises or suspicious lesions. ?Neurologic: Grossly intact, no focal deficits, moving all 4 extremities. ?Psychiatric: Normal mood and affect. ? ?Laboratory Data: ?Lab Results  ?Component Value Date  ? WBC 6.9 11/08/2021  ? HGB 17.2 11/08/2021  ? HCT 52.2 (H) 11/08/2021  ? MCV 89 11/08/2021  ? PLT 263.0 09/25/2021  ? ? ?Lab Results  ?Component Value Date  ? CREATININE 1.01 11/08/2021  ? ? ?Lab Results  ?Component Value Date  ? PSA 0.64 12/25/2017  ? PSA 0.63 11/23/2015  ? PSA 0.50 08/25/2014  ? ? ?Lab Results  ?Component Value Date  ? TESTOSTERONE 176 (L) 11/08/2021  ? ? ?Lab Results  ?Component Value Date  ? HGBA1C 5.5 09/25/2021  ? ? ?Urinalysis ?   ?Component Value Date/Time  ? COLORURINE YELLOW 12/25/2017 1037  ? APPEARANCEUR Clear 05/16/2021 1315  ? LABSPEC 1.010 12/25/2017 1037  ? PHURINE 6.0 12/25/2017 1037  ? GLUCOSEU Negative 05/16/2021 1315  ? GLUCOSEU NEGATIVE 12/25/2017 1037  ? HGBUR NEGATIVE 12/25/2017 1037  ? HGBUR trace-intact 07/25/2009 0000  ? BILIRUBINUR Negative 05/16/2021 1315  ? KETONESUR NEGATIVE 12/25/2017 1037  ? PROTEINUR Negative 05/16/2021 1315  ? UROBILINOGEN 0.2 12/25/2017 1037  ? NITRITE Negative 05/16/2021 1315  ? NITRITE NEGATIVE 12/25/2017 1037  ? LEUKOCYTESUR Negative 05/16/2021 1315  ? ? ?Lab Results  ?Component Value Date  ? LABMICR Comment 05/16/2021  ? ? ?Pertinent Imaging: ? ?No results found for this or any previous visit. ? ?No results found for this or any previous visit. ? ?No results found for this or any previous visit. ? ?No results found for this or any previous visit. ? ?No results found for this or any  previous visit. ? ?No results found for this or any previous visit. ? ?No results found for this or any previous visit. ? ?No results found for this or any previous visit. ? ? ?Assessment & Plan:   ? ?1. Erectile dysfunction due to arterial insufficiency ?Continue sildenafil 60mg  prn ? ?2. Hypogonadism in male ?Continue IM testosterone 200mg  every 2 weeks ?RTC 6 months with testosterone labs ? ? ?No follow-ups on file. ? ? , MD ? ?[facility] [place] ?  ?

## 2021-11-15 ENCOUNTER — Other Ambulatory Visit: Payer: Self-pay | Admitting: Family Medicine

## 2021-11-15 ENCOUNTER — Other Ambulatory Visit (HOSPITAL_BASED_OUTPATIENT_CLINIC_OR_DEPARTMENT_OTHER): Payer: Self-pay

## 2021-11-15 DIAGNOSIS — G4700 Insomnia, unspecified: Secondary | ICD-10-CM

## 2021-11-15 MED ORDER — ZOLPIDEM TARTRATE 10 MG PO TABS
ORAL_TABLET | Freq: Every evening | ORAL | 5 refills | Status: DC | PRN
  Filled 2021-11-15: qty 30, 30d supply, fill #0
  Filled 2022-01-14: qty 30, 30d supply, fill #1
  Filled 2022-03-15: qty 30, 30d supply, fill #2
  Filled 2022-05-13: qty 30, 30d supply, fill #3

## 2021-11-15 NOTE — Telephone Encounter (Signed)
Requesting: zolpidem ?Contract:  ?UDS: 02/08/21 ?Last Visit: 09/25/21 ?Next Visit: 03/25/22 ?Last Refill: 05/15/21 ? ?Please Advise ? ?

## 2021-11-16 ENCOUNTER — Ambulatory Visit: Payer: Medicare PPO | Admitting: Urology

## 2021-12-06 ENCOUNTER — Other Ambulatory Visit (HOSPITAL_BASED_OUTPATIENT_CLINIC_OR_DEPARTMENT_OTHER): Payer: Self-pay

## 2021-12-06 ENCOUNTER — Other Ambulatory Visit: Payer: Self-pay | Admitting: Family Medicine

## 2021-12-06 MED ORDER — PROPRANOLOL HCL 40 MG PO TABS
ORAL_TABLET | Freq: Every day | ORAL | 1 refills | Status: DC
  Filled 2021-12-06: qty 90, 90d supply, fill #0
  Filled 2022-03-11: qty 90, 90d supply, fill #1

## 2021-12-20 ENCOUNTER — Other Ambulatory Visit (HOSPITAL_BASED_OUTPATIENT_CLINIC_OR_DEPARTMENT_OTHER): Payer: Self-pay

## 2021-12-20 ENCOUNTER — Other Ambulatory Visit: Payer: Self-pay | Admitting: Family Medicine

## 2021-12-20 MED ORDER — FLUTICASONE PROPIONATE 50 MCG/ACT NA SUSP
NASAL | 3 refills | Status: DC
  Filled 2021-12-20: qty 48, 90d supply, fill #0
  Filled 2022-04-09: qty 48, 90d supply, fill #1
  Filled 2022-07-21: qty 48, 90d supply, fill #2

## 2021-12-20 MED ORDER — LISINOPRIL 20 MG PO TABS
ORAL_TABLET | Freq: Every day | ORAL | 1 refills | Status: DC
  Filled 2021-12-20: qty 90, 90d supply, fill #0
  Filled 2022-03-25: qty 90, 90d supply, fill #1

## 2022-01-14 ENCOUNTER — Other Ambulatory Visit (HOSPITAL_BASED_OUTPATIENT_CLINIC_OR_DEPARTMENT_OTHER): Payer: Self-pay

## 2022-01-24 ENCOUNTER — Other Ambulatory Visit (HOSPITAL_BASED_OUTPATIENT_CLINIC_OR_DEPARTMENT_OTHER): Payer: Self-pay

## 2022-02-04 ENCOUNTER — Other Ambulatory Visit (HOSPITAL_BASED_OUTPATIENT_CLINIC_OR_DEPARTMENT_OTHER): Payer: Self-pay

## 2022-02-07 ENCOUNTER — Other Ambulatory Visit (HOSPITAL_BASED_OUTPATIENT_CLINIC_OR_DEPARTMENT_OTHER): Payer: Self-pay

## 2022-02-07 MED ORDER — PREDNISONE 5 MG PO TABS
ORAL_TABLET | ORAL | 1 refills | Status: DC
  Filled 2022-02-07: qty 21, 6d supply, fill #0

## 2022-03-11 ENCOUNTER — Other Ambulatory Visit (HOSPITAL_BASED_OUTPATIENT_CLINIC_OR_DEPARTMENT_OTHER): Payer: Self-pay

## 2022-03-15 ENCOUNTER — Other Ambulatory Visit (HOSPITAL_BASED_OUTPATIENT_CLINIC_OR_DEPARTMENT_OTHER): Payer: Self-pay

## 2022-03-24 NOTE — Assessment & Plan Note (Signed)
Supplement and monitor 

## 2022-03-24 NOTE — Assessment & Plan Note (Signed)
Well controlled, no changes to meds. Encouraged heart healthy diet such as the DASH diet and exercise as tolerated.  °

## 2022-03-24 NOTE — Assessment & Plan Note (Signed)
hgba1c acceptable, minimize simple carbs. Increase exercise as tolerated.  

## 2022-03-24 NOTE — Assessment & Plan Note (Signed)
Encourage heart healthy diet such as MIND or DASH diet, increase exercise, avoid trans fats, simple carbohydrates and processed foods, consider a krill or fish or flaxseed oil cap daily. Tolerating Atorvastatin 

## 2022-03-24 NOTE — Assessment & Plan Note (Signed)
Hydrate and monitor 

## 2022-03-25 ENCOUNTER — Other Ambulatory Visit: Payer: Self-pay | Admitting: Family Medicine

## 2022-03-25 ENCOUNTER — Ambulatory Visit (INDEPENDENT_AMBULATORY_CARE_PROVIDER_SITE_OTHER): Payer: Medicare PPO | Admitting: Family Medicine

## 2022-03-25 ENCOUNTER — Other Ambulatory Visit (HOSPITAL_COMMUNITY): Payer: Self-pay

## 2022-03-25 ENCOUNTER — Other Ambulatory Visit (HOSPITAL_BASED_OUTPATIENT_CLINIC_OR_DEPARTMENT_OTHER): Payer: Self-pay

## 2022-03-25 VITALS — BP 110/72 | HR 59 | Temp 98.0°F | Resp 16 | Ht 74.0 in | Wt 197.2 lb

## 2022-03-25 DIAGNOSIS — E349 Endocrine disorder, unspecified: Secondary | ICD-10-CM | POA: Diagnosis not present

## 2022-03-25 DIAGNOSIS — I10 Essential (primary) hypertension: Secondary | ICD-10-CM

## 2022-03-25 DIAGNOSIS — E559 Vitamin D deficiency, unspecified: Secondary | ICD-10-CM

## 2022-03-25 DIAGNOSIS — E782 Mixed hyperlipidemia: Secondary | ICD-10-CM

## 2022-03-25 DIAGNOSIS — G4700 Insomnia, unspecified: Secondary | ICD-10-CM

## 2022-03-25 DIAGNOSIS — M1A9XX0 Chronic gout, unspecified, without tophus (tophi): Secondary | ICD-10-CM | POA: Diagnosis not present

## 2022-03-25 DIAGNOSIS — E291 Testicular hypofunction: Secondary | ICD-10-CM | POA: Diagnosis not present

## 2022-03-25 DIAGNOSIS — R739 Hyperglycemia, unspecified: Secondary | ICD-10-CM

## 2022-03-25 DIAGNOSIS — Z79899 Other long term (current) drug therapy: Secondary | ICD-10-CM | POA: Diagnosis not present

## 2022-03-25 LAB — COMPREHENSIVE METABOLIC PANEL
ALT: 23 U/L (ref 0–53)
AST: 24 U/L (ref 0–37)
Albumin: 4.3 g/dL (ref 3.5–5.2)
Alkaline Phosphatase: 49 U/L (ref 39–117)
BUN: 14 mg/dL (ref 6–23)
CO2: 28 mEq/L (ref 19–32)
Calcium: 9.7 mg/dL (ref 8.4–10.5)
Chloride: 98 mEq/L (ref 96–112)
Creatinine, Ser: 1.18 mg/dL (ref 0.40–1.50)
GFR: 63.8 mL/min (ref >60.00)
Glucose, Bld: 107 mg/dL — ABNORMAL HIGH (ref 70–99)
Potassium: 4.8 mEq/L (ref 3.5–5.1)
Sodium: 135 mEq/L (ref 135–145)
Total Bilirubin: 1.1 mg/dL (ref 0.2–1.2)
Total Protein: 6.6 g/dL (ref 6.0–8.3)

## 2022-03-25 LAB — CBC
HCT: 48.5 % (ref 39.0–52.0)
Hemoglobin: 16.3 g/dL (ref 13.0–17.0)
MCHC: 33.7 g/dL (ref 30.0–36.0)
MCV: 86.6 fl (ref 78.0–100.0)
Platelets: 224 10*3/uL (ref 150.0–400.0)
RBC: 5.6 Mil/uL (ref 4.22–5.81)
RDW: 14.7 % (ref 11.5–15.5)
WBC: 7.7 10*3/uL (ref 4.0–10.5)

## 2022-03-25 LAB — LIPID PANEL
Cholesterol: 134 mg/dL (ref 0–200)
HDL: 46.4 mg/dL (ref >39.00)
LDL Cholesterol: 66 mg/dL (ref 0–99)
NonHDL: 87.81
Total CHOL/HDL Ratio: 3
Triglycerides: 110 mg/dL (ref 0.0–149.0)
VLDL: 22 mg/dL (ref 0.0–40.0)

## 2022-03-25 LAB — HEMOGLOBIN A1C: Hgb A1c MFr Bld: 5.6 % (ref 4.6–6.5)

## 2022-03-25 LAB — TSH: TSH: 0.36 u[IU]/mL (ref 0.35–5.50)

## 2022-03-25 LAB — VITAMIN D 25 HYDROXY (VIT D DEFICIENCY, FRACTURES): VITD: 45.25 ng/mL (ref 30.00–100.00)

## 2022-03-25 MED ORDER — ATORVASTATIN CALCIUM 10 MG PO TABS
ORAL_TABLET | Freq: Every day | ORAL | 1 refills | Status: DC
  Filled 2022-03-25: qty 90, 90d supply, fill #0
  Filled 2022-06-24: qty 90, 90d supply, fill #1

## 2022-03-25 NOTE — Progress Notes (Signed)
Subjective:    Patient ID: Taylor Mcdonald, male    DOB: 1955-09-03, 66 y.o.   MRN: [id number]  Chief Complaint  Patient presents with   Drug / Alcohol Assessment    Here for Uds update    HPI Patient is in today for follow-up on chronic medical concerns.  No recent febrile illness or hospitalizations.  He is trying to stay active and goes to the gym multiple times a week.  Does both resistance and cardio.  Eats a heart healthy diet on a good day but does acknowledge wavering from the past frequently.  No other acute concerns or recent episodes. Denies CP/palp/SOB/HA/congestion/fevers/GI or GU c/o. Taking meds as prescribed   Past Medical History:  Diagnosis Date   Allergy    Anxiety 03/04/2012   Fatigue    GERD (gastroesophageal reflux disease)    Gout    Hyperglycemia    Hyperlipidemia    Hypertension    Insomnia    Internal nasal lesion 07/04/2016   Low testosterone    Lower back pain 09/09/2012   Migraine    Pain of left heel 10/01/2011   Preventative health care 07/24/2012   RLS (restless legs syndrome)    Sinusitis 03/04/2012   Varicose veins of both lower extremities 12/12/2016   Vasovagal near syncope 04/30/2013   Vitamin D deficiency 12/23/2011    Past Surgical History:  Procedure Laterality Date   LUMBAR LAMINECTOMY/DECOMPRESSION MICRODISCECTOMY N/A 05/13/2013   Procedure: LUMBAR LAMINECTOMY/DECOMPRESSION MICRODISCECTOMY 1 LEVEL L3-4;  Surgeon: Johnn Hai, MD;  Location: [facility];  Service: Orthopedics;  Laterality: N/A;   PANENDOSCOPY     TONSILLECTOMY      Family History  Problem Relation Age of Onset   Obesity Mother    Hypertension Son    Hypertension Maternal Grandmother    Obesity Maternal Grandmother    Cancer Maternal Grandmother 70       intestinal   Dementia Maternal Grandfather    Colon cancer Neg Hx    Esophageal cancer Neg Hx    Rectal cancer Neg Hx    Stomach cancer Neg Hx     Social History   Socioeconomic History   Marital status:  Married    Spouse name: Not on file   Number of children: Not on file   Years of education: Not on file   Highest education level: Not on file  Occupational History   Not on file  Tobacco Use   Smoking status: Never   Smokeless tobacco: Never  Vaping Use   Vaping Use: Never used  Substance and Sexual Activity   Alcohol use: No   Drug use: Not Currently    Types: Hydrocodone    Comment: Pt states he does not use anymore.   Sexual activity: Yes  Other Topics Concern   Not on file  Social History Narrative   Not on file   Social Determinants of Health   Financial Resource Strain: Low Risk  (08/03/2021)   Overall Financial Resource Strain (CARDIA)    Difficulty of Paying Living Expenses: Not hard at all  Food Insecurity: No Food Insecurity (08/03/2021)   Hunger Vital Sign    Worried About Running Out of Food in the Last Year: Never true    Ran Out of Food in the Last Year: Never true  Transportation Needs: No Transportation Needs (08/03/2021)   PRAPARE - Hydrologist (Medical): No    Lack of Transportation (Non-Medical): No  Physical Activity: Insufficiently Active (08/03/2021)   Exercise Vital Sign    Days of Exercise per Week: 4 days    Minutes of Exercise per Session: 30 min  Stress: No Stress Concern Present (08/03/2021)   [place]    Feeling of Stress : Not at all  Social Connections: Moderately Integrated (08/03/2021)   Social Connection and Isolation Panel [NHANES]    Frequency of Communication with Friends and Family: More than three times a week    Frequency of Social Gatherings with Friends and Family: More than three times a week    Attends Religious Services: More than 4 times per year    Active Member of [employer] or Organizations: No    Attends Archivist Meetings: Never    Marital Status: Married  Human resources officer Violence: Not At Risk (08/03/2021)   Humiliation,  Afraid, Rape, and Kick questionnaire    Fear of Current or Ex-Partner: No    Emotionally Abused: No    Physically Abused: No    Sexually Abused: No    Outpatient Medications Prior to Visit  Medication Sig Dispense Refill   allopurinol (ZYLOPRIM) 100 MG tablet Take 2 tablets (200 mg total) by mouth daily. 180 tablet 3   anastrozole (ARIMIDEX) 1 MG tablet Take 1/2 tablet (0.5 mg total) by mouth daily. 45 tablet 3   aspirin 81 MG tablet Take 1 tablet (81 mg total) by mouth daily. Resume 4 days post-op 30 tablet    atorvastatin (LIPITOR) 10 MG tablet TAKE 1 TABLET BY MOUTH ONCE DAILY 90 tablet 1   Fish Oil-Cholecalciferol (FISH OIL + D3) 1200-1000 MG-UNIT CAPS Take 1 capsule by mouth daily.     fluticasone (FLONASE) 50 MCG/ACT nasal spray PLACE 2 SPRAYS INTO THE NOSE DAILY. 48 g 3   lisinopril (ZESTRIL) 20 MG tablet TAKE 1 TABLET (20 MG TOTAL) BY MOUTH DAILY. 90 tablet 1   omeprazole (PRILOSEC) 20 MG capsule Take 1 capsule (20 mg total) by mouth daily. 90 capsule 1   OVER THE COUNTER MEDICATION 12 hr decongestant 122m     propranolol (INDERAL) 40 MG tablet TAKE 1 TABLET (40 MG TOTAL) BY MOUTH DAILY. 90 tablet 1   sildenafil (REVATIO) 20 MG tablet Take 1 tablet (20 mg total) by mouth as needed. 90 tablet 3   testosterone cypionate (DEPOTESTOSTERONE CYPIONATE) 200 MG/ML injection Inject 1 mL (200 mg total) into the skin every 14 (fourteen) days. 10 mL 3   zolpidem (AMBIEN) 10 MG tablet TAKE 1/2 TO 1 TABLET BY MOUTH AT BEDTIME AS NEEDED FOR SLEEP 30 tablet 5   cefdinir (OMNICEF) 300 MG capsule Take 1 capsule (300 mg total) by mouth 2 (two) times daily. 20 capsule 0   predniSONE (DELTASONE) 5 MG tablet Take 6 tablets by mouth on day 1, then 5 tablets on day 2, then 4 tablets on day 3, the 3 tablets on day 4, then 2 tablets on day 5, and then 1 tablet on day 6. 21 tablet 1   No facility-administered medications prior to visit.    No Known Allergies  Review of Systems  Constitutional:  Negative  for fever and malaise/fatigue.  HENT:  Negative for congestion.   Eyes:  Negative for blurred vision.  Respiratory:  Negative for shortness of breath.   Cardiovascular:  Negative for chest pain, palpitations and leg swelling.  Gastrointestinal:  Negative for abdominal pain, blood in stool and nausea.  Genitourinary:  Negative for dysuria  and frequency.  Musculoskeletal:  Negative for falls.  Skin:  Negative for rash.  Neurological:  Negative for dizziness, loss of consciousness and headaches.  Endo/Heme/Allergies:  Negative for environmental allergies.  Psychiatric/Behavioral:  Negative for depression. The patient is not nervous/anxious.        Objective:    Physical Exam Constitutional:      General: He is not in acute distress.    Appearance: Normal appearance. He is not ill-appearing or toxic-appearing.  HENT:     Head: Normocephalic and atraumatic.     Right Ear: External ear normal.     Left Ear: External ear normal.     Nose: Nose normal.  Eyes:     General:        Right eye: No discharge.        Left eye: No discharge.     Conjunctiva/sclera: Conjunctivae normal.     Pupils: Pupils are equal, round, and reactive to light.  Cardiovascular:     Rate and Rhythm: Normal rate.     Heart sounds: Normal heart sounds.  Pulmonary:     Effort: Pulmonary effort is normal.     Breath sounds: Normal breath sounds.  Abdominal:     General: Bowel sounds are normal.     Palpations: Abdomen is soft.  Musculoskeletal:     Right lower leg: No edema.     Left lower leg: No edema.  Skin:    Findings: No rash.  Neurological:     Mental Status: He is alert and oriented to person, place, and time.  Psychiatric:        Behavior: Behavior normal.     BP 110/72 (BP Location: Right Arm, Patient Position: Sitting, Cuff Size: Normal)   Pulse (!) 59   Temp 98 F (36.7 C) (Oral)   Resp 16   Ht '6\' 2"'  (1.88 m)   Wt 197 lb 3.2 oz (89.4 kg)   SpO2 96%   BMI 25.32 kg/m  Wt Readings  from Last 3 Encounters:  03/25/22 197 lb 3.2 oz (89.4 kg)  09/27/21 192 lb (87.1 kg)  09/25/21 197 lb (89.4 kg)    Diabetic Foot Exam - Simple   No data filed    Lab Results  Component Value Date   WBC 6.9 11/08/2021   HGB 17.2 11/08/2021   HCT 52.2 (H) 11/08/2021   PLT 263.0 09/25/2021   GLUCOSE 102 (H) 11/08/2021   CHOL 147 09/25/2021   TRIG 132.0 09/25/2021   HDL 49.10 09/25/2021   LDLDIRECT 125.7 02/10/2013   LDLCALC 72 09/25/2021   ALT 26 11/08/2021   AST 24 11/08/2021   NA 140 11/08/2021   K 4.9 11/08/2021   CL 99 11/08/2021   CREATININE 1.01 11/08/2021   BUN 17 11/08/2021   CO2 23 11/08/2021   TSH 0.54 09/25/2021   PSA 0.64 12/25/2017   HGBA1C 5.5 09/25/2021   MICROALBUR <0.7 12/25/2017    Lab Results  Component Value Date   TSH 0.54 09/25/2021   Lab Results  Component Value Date   WBC 6.9 11/08/2021   HGB 17.2 11/08/2021   HCT 52.2 (H) 11/08/2021   MCV 89 11/08/2021   PLT 263.0 09/25/2021   Lab Results  Component Value Date   NA 140 11/08/2021   K 4.9 11/08/2021   CO2 23 11/08/2021   GLUCOSE 102 (H) 11/08/2021   BUN 17 11/08/2021   CREATININE 1.01 11/08/2021   BILITOT 0.8 11/08/2021   ALKPHOS 64 11/08/2021  AST 24 11/08/2021   ALT 26 11/08/2021   PROT 6.7 11/08/2021   ALBUMIN 4.6 11/08/2021   CALCIUM 9.8 11/08/2021   EGFR 82 11/08/2021   GFR 60.91 09/25/2021   Lab Results  Component Value Date   CHOL 147 09/25/2021   Lab Results  Component Value Date   HDL 49.10 09/25/2021   Lab Results  Component Value Date   LDLCALC 72 09/25/2021   Lab Results  Component Value Date   TRIG 132.0 09/25/2021   Lab Results  Component Value Date   CHOLHDL 3 09/25/2021   Lab Results  Component Value Date   HGBA1C 5.5 09/25/2021       Assessment & Plan:   Problem List Items Addressed This Visit     Gout    Hydrate and monitor      Hypertension    Well controlled, no changes to meds. Encouraged heart healthy diet such as the DASH  diet and exercise as tolerated.       Relevant Orders   CBC   Comprehensive metabolic panel   TSH   Testosterone deficiency    Supplement and monitor      Insomnia   Relevant Orders   Drug Monitoring Panel [phone] , Urine   Hyperlipidemia    Encourage heart healthy diet such as MIND or DASH diet, increase exercise, avoid trans fats, simple carbohydrates and processed foods, consider a krill or fish or flaxseed oil cap daily. Tolerating Atorvastatin      Relevant Orders   Lipid panel   Hyperglycemia    hgba1c acceptable, minimize simple carbs. Increase exercise as tolerated.       Relevant Orders   Hemoglobin A1c   Vitamin D deficiency    Supplement and monitor      Relevant Orders   VITAMIN D 25 Hydroxy (Vit-D Deficiency, Fractures)   Hypogonadism in male    Follows with urology for surveillance and treatment      Other Visit Diagnoses     High risk medication use    -  Primary   Relevant Orders   Drug Monitoring Panel [phone] , Urine       I have discontinued Sholom W. Westling's cefdinir and predniSONE. I am also having him maintain his Fish Oil + D3, aspirin, OVER THE COUNTER MEDICATION, anastrozole, omeprazole, allopurinol, testosterone cypionate, sildenafil, zolpidem, propranolol, lisinopril, fluticasone, and atorvastatin.  No orders of the defined types were placed in this encounter.    Penni Homans, MD

## 2022-03-25 NOTE — Patient Instructions (Addendum)
60-80 ounces daily Shingrix is the new shingles shot, 2 shots over 2-6 months, confirm coverage with insurance and document, then can return here for shots with nurse appt or at pharmacy   Prevnar 20 one and done vaccine at office or at pharmacy  New Covid booster late September or early October  Flu shot September or october  Hypertension, Adult High blood pressure (hypertension) is when the force of blood pumping through the arteries is too strong. The arteries are the blood vessels that carry blood from the heart throughout the body. Hypertension forces the heart to work harder to pump blood and may cause arteries to become narrow or stiff. Untreated or uncontrolled hypertension can lead to a heart attack, heart failure, a stroke, kidney disease, and other problems. A blood pressure reading consists of a higher number over a lower number. Ideally, your blood pressure should be below 120/80. The first ("top") number is called the systolic pressure. It is a measure of the pressure in your arteries as your heart beats. The second ("bottom") number is called the diastolic pressure. It is a measure of the pressure in your arteries as the heart relaxes. What are the causes? The exact cause of this condition is not known. There are some conditions that result in high blood pressure. What increases the risk? Certain factors may make you more likely to develop high blood pressure. Some of these risk factors are under your control, including: Smoking. Not getting enough exercise or physical activity. Being overweight. Having too much fat, sugar, calories, or salt (sodium) in your diet. Drinking too much alcohol. Other risk factors include: Having a personal history of heart disease, diabetes, high cholesterol, or kidney disease. Stress. Having a family history of high blood pressure and high cholesterol. Having obstructive sleep apnea. Age. The risk increases with age. What are the signs or  symptoms? High blood pressure may not cause symptoms. Very high blood pressure (hypertensive crisis) may cause: Headache. Fast or irregular heartbeats (palpitations). Shortness of breath. Nosebleed. Nausea and vomiting. Vision changes. Severe chest pain, dizziness, and seizures. How is this diagnosed? This condition is diagnosed by measuring your blood pressure while you are seated, with your arm resting on a flat surface, your legs uncrossed, and your feet flat on the floor. The cuff of the blood pressure monitor will be placed directly against the skin of your upper arm at the level of your heart. Blood pressure should be measured at least twice using the same arm. Certain conditions can cause a difference in blood pressure between your right and left arms. If you have a high blood pressure reading during one visit or you have normal blood pressure with other risk factors, you may be asked to: Return on a different day to have your blood pressure checked again. Monitor your blood pressure at home for 1 week or longer. If you are diagnosed with hypertension, you may have other blood or imaging tests to help your health care provider understand your overall risk for other conditions. How is this treated? This condition is treated by making healthy lifestyle changes, such as eating healthy foods, exercising more, and reducing your alcohol intake. You may be referred for counseling on a healthy diet and physical activity. Your health care provider may prescribe medicine if lifestyle changes are not enough to get your blood pressure under control and if: Your systolic blood pressure is above 130. Your diastolic blood pressure is above 80. Your personal target blood pressure may vary  depending on your medical conditions, your age, and other factors. Follow these instructions at home: Eating and drinking  Eat a diet that is high in fiber and potassium, and low in sodium, added sugar, and fat. An  example of this eating plan is called the DASH diet. DASH stands for Dietary Approaches to Stop Hypertension. To eat this way: Eat plenty of fresh fruits and vegetables. Try to fill one half of your plate at each meal with fruits and vegetables. Eat whole grains, such as whole-wheat pasta, brown rice, or whole-grain bread. Fill about one fourth of your plate with whole grains. Eat or drink low-fat dairy products, such as skim milk or low-fat yogurt. Avoid fatty cuts of meat, processed or cured meats, and poultry with skin. Fill about one fourth of your plate with lean proteins, such as fish, chicken without skin, beans, eggs, or tofu. Avoid pre-made and processed foods. These tend to be higher in sodium, added sugar, and fat. Reduce your daily sodium intake. Many people with hypertension should eat less than 1,500 mg of sodium a day. Do not drink alcohol if: Your health care provider tells you not to drink. You are pregnant, may be pregnant, or are planning to become pregnant. If you drink alcohol: Limit how much you have to: 0-1 drink a day for women. 0-2 drinks a day for men. Know how much alcohol is in your drink. In the [place] one drink equals one 12 oz bottle of beer (355 mL), one 5 oz glass of wine (148 mL), or one 1 oz glass of hard liquor (44 mL). Lifestyle  Work with your health care provider to maintain a healthy body weight or to lose weight. Ask what an ideal weight is for you. Get at least 30 minutes of exercise that causes your heart to beat faster (aerobic exercise) most days of the week. Activities may include walking, swimming, or biking. Include exercise to strengthen your muscles (resistance exercise), such as Pilates or lifting weights, as part of your weekly exercise routine. Try to do these types of exercises for 30 minutes at least 3 days a week. Do not use any products that contain nicotine or tobacco. These products include cigarettes, chewing tobacco, and vaping devices,  such as e-cigarettes. If you need help quitting, ask your health care provider. Monitor your blood pressure at home as told by your health care provider. Keep all follow-up visits. This is important. Medicines Take over-the-counter and prescription medicines only as told by your health care provider. Follow directions carefully. Blood pressure medicines must be taken as prescribed. Do not skip doses of blood pressure medicine. Doing this puts you at risk for problems and can make the medicine less effective. Ask your health care provider about side effects or reactions to medicines that you should watch for. Contact a health care provider if you: Think you are having a reaction to a medicine you are taking. Have headaches that keep coming back (recurring). Feel dizzy. Have swelling in your ankles. Have trouble with your vision. Get help right away if you: Develop a severe headache or confusion. Have unusual weakness or numbness. Feel faint. Have severe pain in your chest or abdomen. Vomit repeatedly. Have trouble breathing. These symptoms may be an emergency. Get help right away. Call 911. Do not wait to see if the symptoms will go away. Do not drive yourself to the hospital. Summary Hypertension is when the force of blood pumping through your arteries is too strong. If this  condition is not controlled, it may put you at risk for serious complications. Your personal target blood pressure may vary depending on your medical conditions, your age, and other factors. For most people, a normal blood pressure is less than 120/80. Hypertension is treated with lifestyle changes, medicines, or a combination of both. Lifestyle changes include losing weight, eating a healthy, low-sodium diet, exercising more, and limiting alcohol. This information is not intended to replace advice given to you by your health care provider. Make sure you discuss any questions you have with your health care  provider. Document Revised: 05/22/2021 Document Reviewed: 05/22/2021 Elsevier Patient Education  2023 [employer].

## 2022-03-25 NOTE — Assessment & Plan Note (Signed)
Follows with urology for surveillance and treatment

## 2022-03-26 ENCOUNTER — Other Ambulatory Visit (HOSPITAL_BASED_OUTPATIENT_CLINIC_OR_DEPARTMENT_OTHER): Payer: Self-pay

## 2022-03-27 LAB — DRUG MONITORING PANEL 376104, URINE
Amphetamines: NEGATIVE ng/mL (ref <500)
Barbiturates: NEGATIVE ng/mL (ref <300)
Benzodiazepines: NEGATIVE ng/mL (ref <100)
Cocaine Metabolite: NEGATIVE ng/mL (ref <150)
Desmethyltramadol: NEGATIVE ng/mL (ref <100)
Opiates: NEGATIVE ng/mL (ref <100)
Oxycodone: NEGATIVE ng/mL (ref <100)
Tramadol: NEGATIVE ng/mL (ref <100)

## 2022-03-27 LAB — DM TEMPLATE

## 2022-03-28 ENCOUNTER — Other Ambulatory Visit (HOSPITAL_BASED_OUTPATIENT_CLINIC_OR_DEPARTMENT_OTHER): Payer: Self-pay

## 2022-04-09 ENCOUNTER — Other Ambulatory Visit (HOSPITAL_BASED_OUTPATIENT_CLINIC_OR_DEPARTMENT_OTHER): Payer: Self-pay

## 2022-04-13 IMAGING — US US ABDOMEN COMPLETE
1 series · 14 of 25 positions shown · non-contrast
Comparison: 06/29/2019

CLINICAL DATA: Left renal cyst.  Fatty liver.

EXAM:
ABDOMEN ULTRASOUND COMPLETE

[Series 1: us abdomen complete · 14 of 65 slices shown]
[im 1/65]
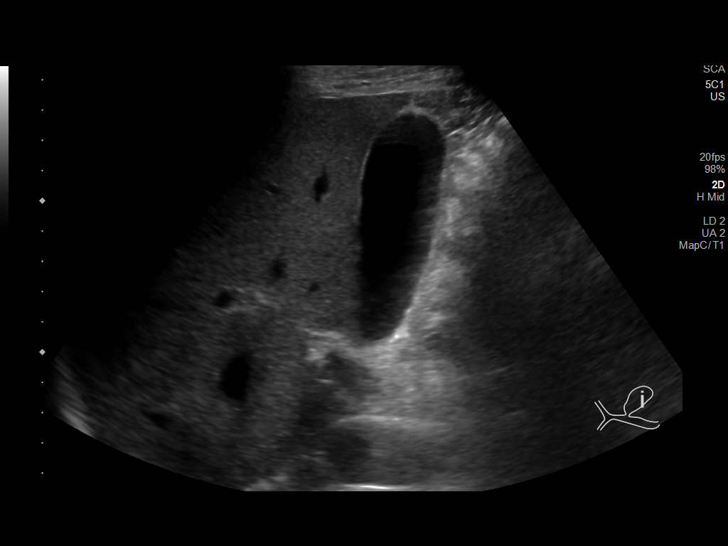
[im 6/65]
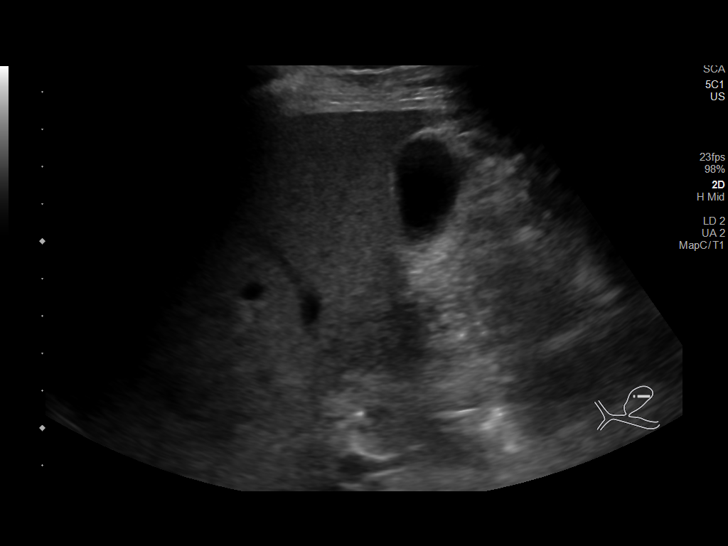
[im 11/65]
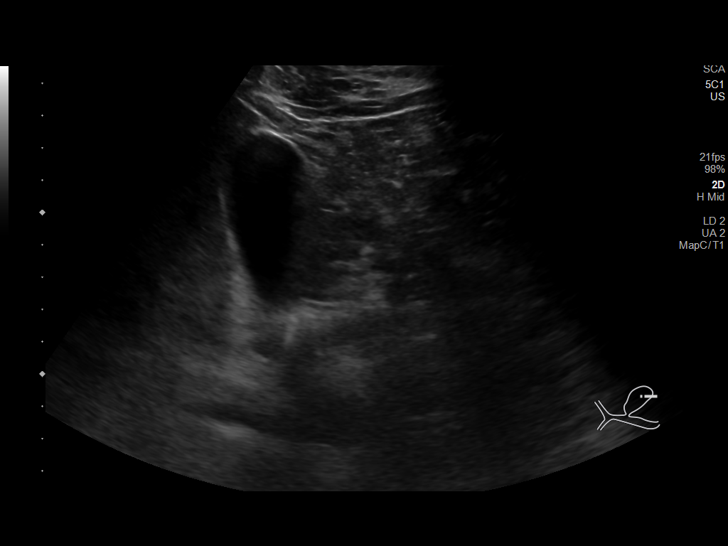
[im 17/65]
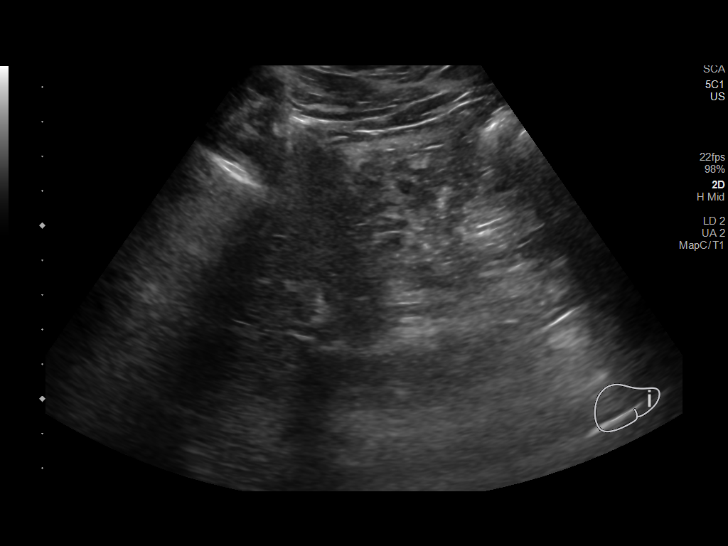
[im 22/65]
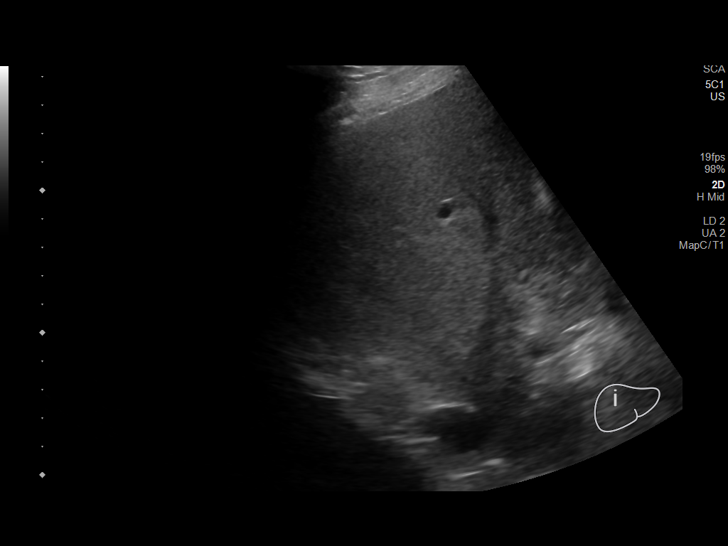
[im 25/65]
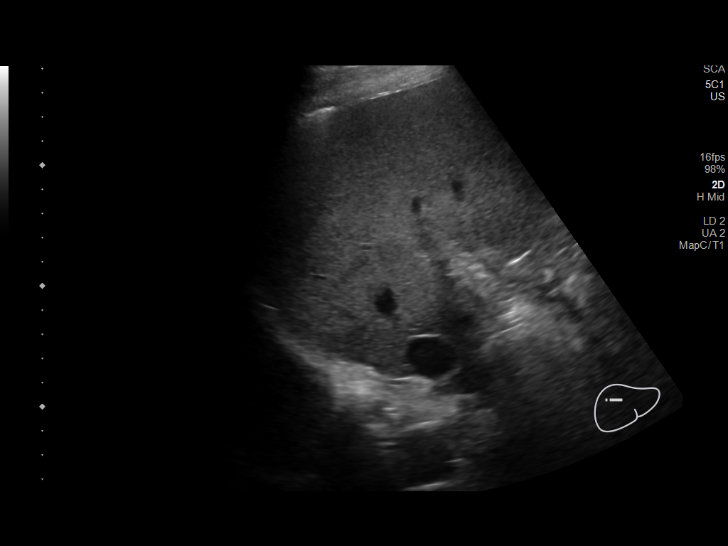
[im 30/65]
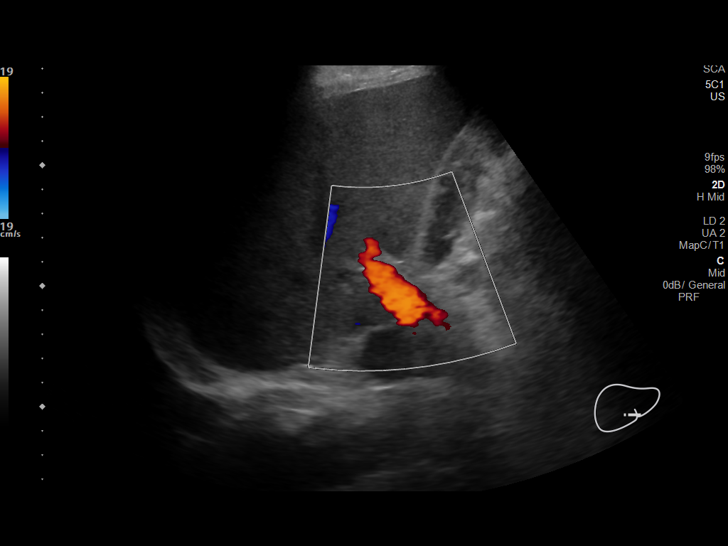
[im 35/65]
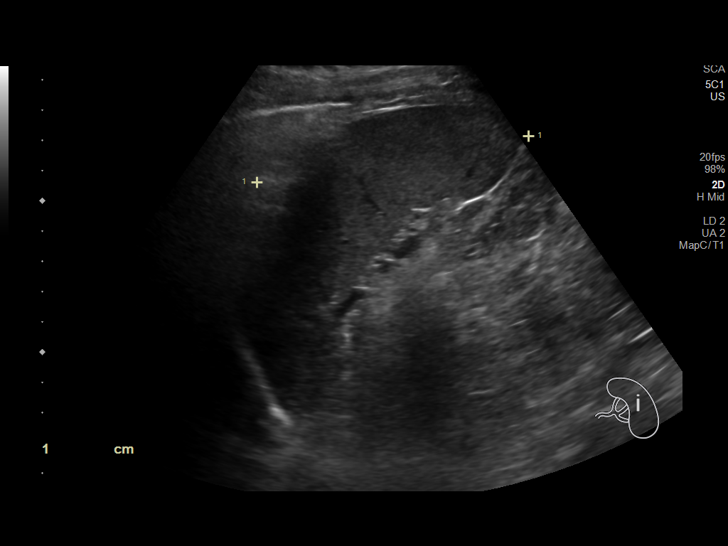
[im 41/65]
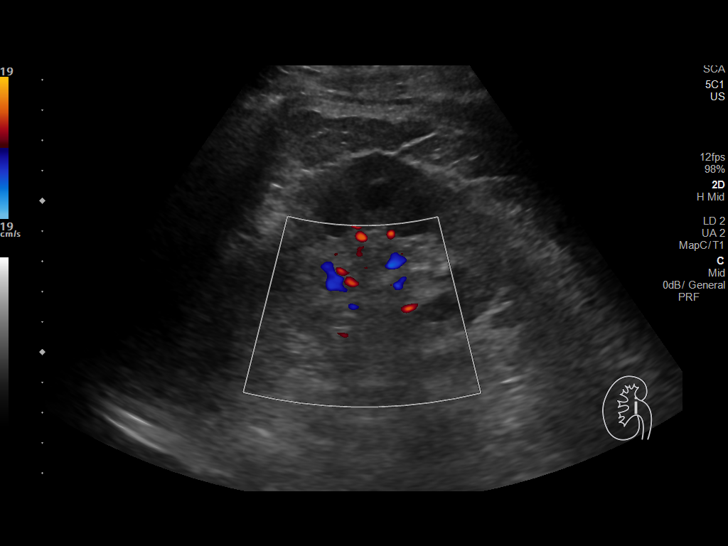
[im 43/65]
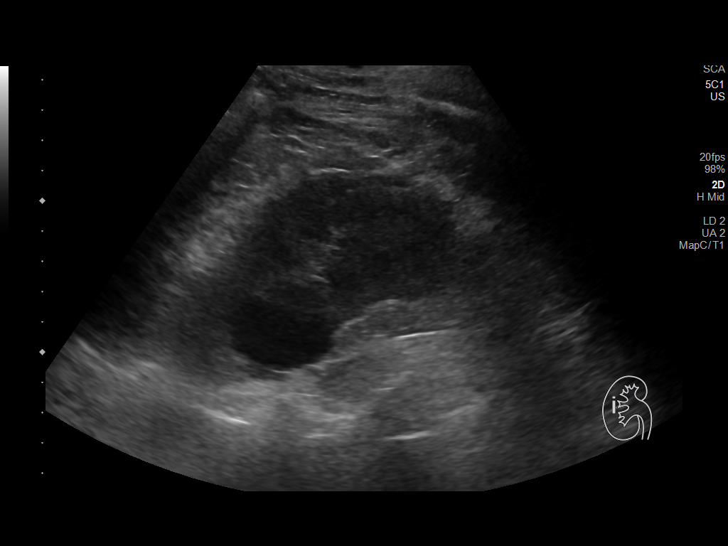
[im 49/65]
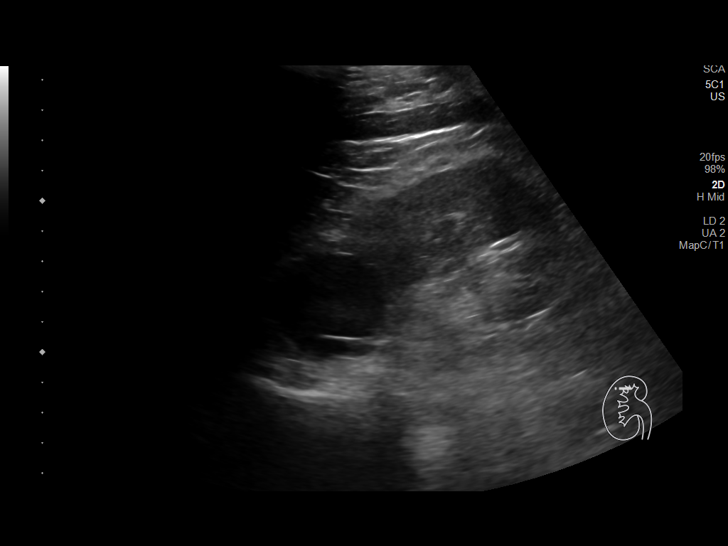
[im 54/65]
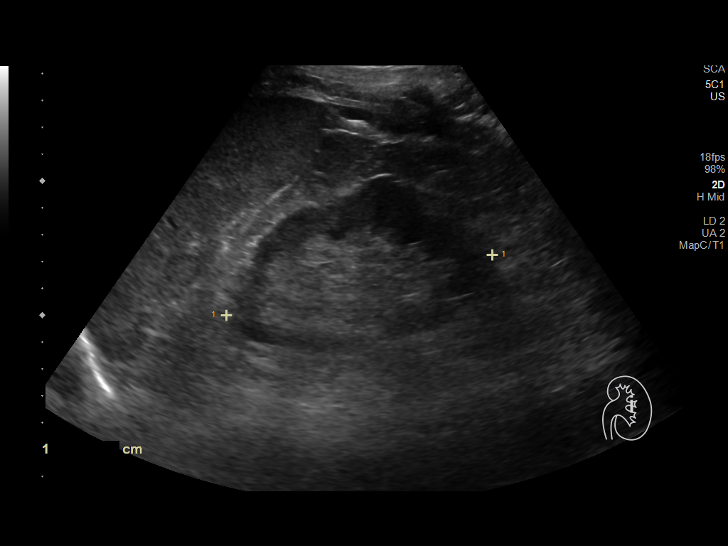
[im 59/65]
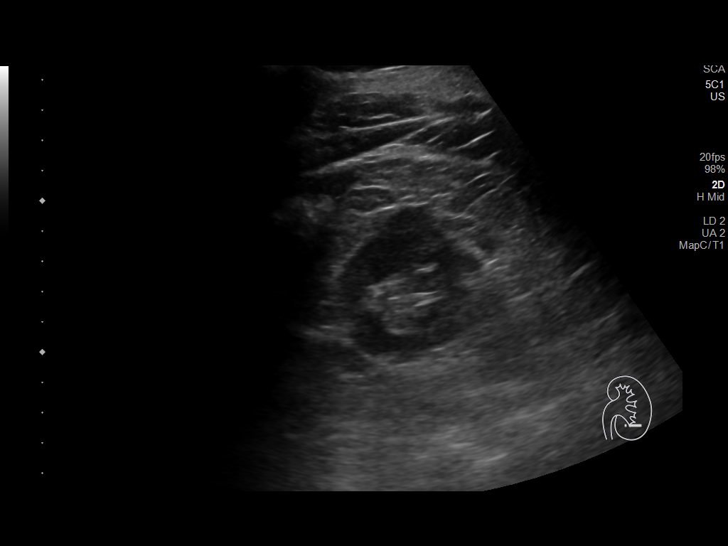
[im 65/65]
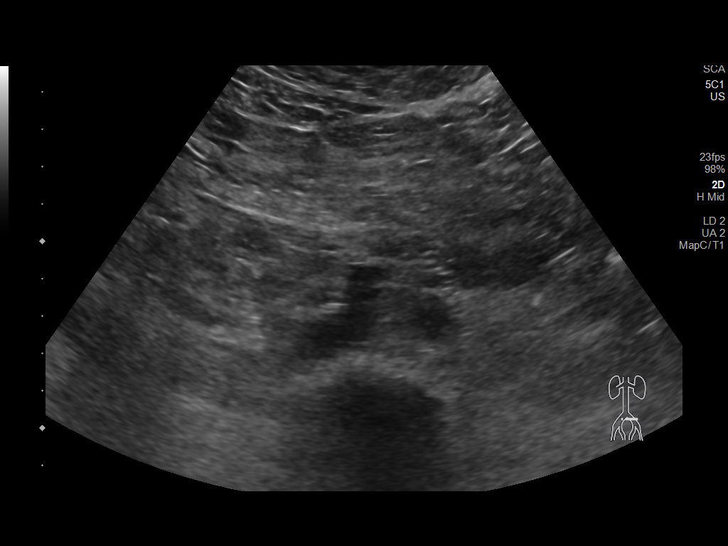

[14 of 25 positions shown; findings below may reference images not displayed]

FINDINGS: Gallbladder: No gallstones or wall thickening visualized. No
sonographic Murphy sign noted by sonographer.

Common bile duct: Diameter: 2 mm

Liver: Diffusely increased parenchymal echogenicity without a focal
lesion identified. Portal vein is patent on color Doppler imaging
with normal direction of blood flow towards the liver.

IVC: No abnormality visualized.

Pancreas: Visualized portion unremarkable.

Spleen: Size and appearance within normal limits.

Right Kidney: Length: 10.5 cm. Echogenicity within normal limits. No
solid mass or hydronephrosis. 3.9 cm upper pole cyst, either new or
larger compared to the prior study but benign in appearance.

Left Kidney: Length: 10.1 cm. Echogenicity within normal limits. No
solid mass or hydronephrosis. 1.5 cm simple cyst, slightly larger
than on the prior study.

Abdominal aorta: No aneurysm visualized.

Other findings: None.
IMPRESSION: 1. Echogenic liver compatible with steatosis.
2. Bilateral renal cysts.

## 2022-04-17 ENCOUNTER — Other Ambulatory Visit (HOSPITAL_BASED_OUTPATIENT_CLINIC_OR_DEPARTMENT_OTHER): Payer: Self-pay

## 2022-05-08 ENCOUNTER — Other Ambulatory Visit: Payer: Medicare PPO

## 2022-05-08 DIAGNOSIS — E291 Testicular hypofunction: Secondary | ICD-10-CM | POA: Diagnosis not present

## 2022-05-09 ENCOUNTER — Other Ambulatory Visit (HOSPITAL_BASED_OUTPATIENT_CLINIC_OR_DEPARTMENT_OTHER): Payer: Self-pay

## 2022-05-10 ENCOUNTER — Other Ambulatory Visit (HOSPITAL_BASED_OUTPATIENT_CLINIC_OR_DEPARTMENT_OTHER): Payer: Self-pay

## 2022-05-14 ENCOUNTER — Other Ambulatory Visit (HOSPITAL_BASED_OUTPATIENT_CLINIC_OR_DEPARTMENT_OTHER): Payer: Self-pay

## 2022-05-14 LAB — COMPREHENSIVE METABOLIC PANEL
ALT: 30 IU/L (ref 0–44)
AST: 26 IU/L (ref 0–40)
Albumin/Globulin Ratio: 2.1 (ref 1.2–2.2)
Albumin: 4.4 g/dL (ref 3.9–4.9)
Alkaline Phosphatase: 58 IU/L (ref 44–121)
BUN/Creatinine Ratio: 18 (ref 10–24)
BUN: 22 mg/dL (ref 8–27)
Bilirubin Total: 0.9 mg/dL (ref 0.0–1.2)
CO2: 23 mmol/L (ref 20–29)
Calcium: 9.7 mg/dL (ref 8.6–10.2)
Chloride: 100 mmol/L (ref 96–106)
Creatinine, Ser: 1.23 mg/dL (ref 0.76–1.27)
Globulin, Total: 2.1 g/dL (ref 1.5–4.5)
Glucose: 115 mg/dL — ABNORMAL HIGH (ref 70–99)
Potassium: 5.4 mmol/L — ABNORMAL HIGH (ref 3.5–5.2)
Sodium: 137 mmol/L (ref 134–144)
Total Protein: 6.5 g/dL (ref 6.0–8.5)
eGFR: 64 mL/min/{1.73_m2} (ref >59)

## 2022-05-14 LAB — CBC WITH DIFFERENTIAL
Basophils Absolute: 0.1 10*3/uL (ref 0.0–0.2)
Basos: 1 %
EOS (ABSOLUTE): 0.3 10*3/uL (ref 0.0–0.4)
Eos: 4 %
Hematocrit: 48.8 % (ref 37.5–51.0)
Hemoglobin: 15.8 g/dL (ref 13.0–17.7)
Immature Grans (Abs): 0 10*3/uL (ref 0.0–0.1)
Immature Granulocytes: 0 %
Lymphocytes Absolute: 2.1 10*3/uL (ref 0.7–3.1)
Lymphs: 30 %
MCH: 28.2 pg (ref 26.6–33.0)
MCHC: 32.4 g/dL (ref 31.5–35.7)
MCV: 87 fL (ref 79–97)
Monocytes Absolute: 0.7 10*3/uL (ref 0.1–0.9)
Monocytes: 9 %
Neutrophils Absolute: 4 10*3/uL (ref 1.4–7.0)
Neutrophils: 56 %
RBC: 5.61 x10E6/uL (ref 4.14–5.80)
RDW: 13.3 % (ref 11.6–15.4)
WBC: 7.1 10*3/uL (ref 3.4–10.8)

## 2022-05-14 LAB — PSA: Prostate Specific Ag, Serum: 1.4 ng/mL (ref 0.0–4.0)

## 2022-05-14 LAB — TESTOSTERONE,FREE AND TOTAL
Testosterone, Free: 28.1 pg/mL — ABNORMAL HIGH (ref 6.6–18.1)
Testosterone: 1434 ng/dL — ABNORMAL HIGH (ref 264–916)

## 2022-05-15 ENCOUNTER — Other Ambulatory Visit (HOSPITAL_BASED_OUTPATIENT_CLINIC_OR_DEPARTMENT_OTHER): Payer: Self-pay

## 2022-05-15 ENCOUNTER — Encounter: Payer: Self-pay | Admitting: Urology

## 2022-05-15 ENCOUNTER — Ambulatory Visit (INDEPENDENT_AMBULATORY_CARE_PROVIDER_SITE_OTHER): Payer: Medicare PPO | Admitting: Urology

## 2022-05-15 VITALS — BP 119/76 | HR 66

## 2022-05-15 DIAGNOSIS — E291 Testicular hypofunction: Secondary | ICD-10-CM | POA: Diagnosis not present

## 2022-05-15 DIAGNOSIS — N5201 Erectile dysfunction due to arterial insufficiency: Secondary | ICD-10-CM

## 2022-05-15 MED ORDER — TESTOSTERONE CYPIONATE 200 MG/ML IM SOLN
200.0000 mg | INTRAMUSCULAR | 3 refills | Status: DC
  Filled 2022-05-15: qty 10, 140d supply, fill #0
  Filled 2022-07-02: qty 6, 84d supply, fill #0
  Filled 2022-09-24: qty 6, 84d supply, fill #1

## 2022-05-15 MED ORDER — TADALAFIL 20 MG PO TABS: 20.0000 mg | ORAL_TABLET | ORAL | 5 refills | Status: DC | PRN

## 2022-05-15 NOTE — Patient Instructions (Signed)
Erectile Dysfunction ?Erectile dysfunction (ED) is the inability to get or keep an erection in order to have sexual intercourse. ED is considered a symptom of an underlying disorder and is not considered a disease. ED may include: ?Inability to get an erection. ?Lack of enough hardness of the erection to allow penetration. ?Loss of erection before sex is finished. ?What are the causes? ?This condition may be caused by: ?Physical causes, such as: ?Artery problems. This may include heart disease, high blood pressure, atherosclerosis, and diabetes. ?Hormonal problems, such as low testosterone. ?Obesity. ?Nerve problems. This may include back or pelvic injuries, multiple sclerosis, Parkinson's disease, spinal cord injury, and stroke. ?Certain medicines, such as: ?Pain relievers. ?Antidepressants. ?Blood pressure medicines and water pills (diuretics). ?Cancer medicines. ?Antihistamines. ?Muscle relaxants. ?Lifestyle factors, such as: ?Use of drugs such as marijuana, cocaine, or opioids. ?Excessive use of alcohol. ?Smoking. ?Lack of physical activity or exercise. ?Psychological causes, such as: ?Anxiety or stress. ?Sadness or depression. ?Exhaustion. ?Fear about sexual performance. ?Guilt. ?What are the signs or symptoms? ?Symptoms of this condition include: ?Inability to get an erection. ?Lack of enough hardness of the erection to allow penetration. ?Loss of the erection before sex is finished. ?Sometimes having normal erections, but with frequent unsatisfactory episodes. ?Low sexual satisfaction in either partner due to erection problems. ?A curved penis occurring with erection. The curve may cause pain, or the penis may be too curved to allow for intercourse. ?Never having nighttime or morning erections. ?How is this diagnosed? ?This condition is often diagnosed by: ?Performing a physical exam to find other diseases or specific problems with the penis. ?Asking you detailed questions about the problem. ?Doing tests,  such as: ?Blood tests to check for diabetes mellitus or high cholesterol, or to measure hormone levels. ?Other tests to check for underlying health conditions. ?An ultrasound exam to check for scarring. ?A test to check blood flow to the penis. ?Doing a sleep study at home to measure nighttime erections. ?How is this treated? ?This condition may be treated by: ?Medicines, such as: ?Medicine taken by mouth to help you achieve an erection (oral medicine). ?Hormone replacement therapy to replace low testosterone levels. ?Medicine that is injected into the penis. Your health care provider may instruct you how to give yourself these injections at home. ?Medicine that is delivered with a short applicator tube. The tube is inserted into the opening at the tip of the penis, which is the opening of the urethra. A tiny pellet of medicine is put in the urethra. The pellet dissolves and enhances erectile function. This is also called MUSE (medicated urethral system for erections) therapy. ?Vacuum pump. This is a pump with a ring on it. The pump and ring are placed on the penis and used to create pressure that helps the penis become erect. ?Penile implant surgery. In this procedure, you may receive: ?An inflatable implant. This consists of cylinders, a pump, and a reservoir. The cylinders can be inflated with a fluid that helps to create an erection, and they can be deflated after intercourse. ?A semi-rigid implant. This consists of two silicone rubber rods. The rods provide some rigidity. They are also flexible, so the penis can both curve downward in its normal position and become straight for sexual intercourse. ?Blood vessel surgery to improve blood flow to the penis. During this procedure, a blood vessel from a different part of the body is placed into the penis to allow blood to flow around (bypass) damaged or blocked blood vessels. ?Lifestyle changes,   such as exercising more, losing weight, and quitting smoking. ?Follow  these instructions at home: ?Medicines ? ?Take over-the-counter and prescription medicines only as told by your health care provider. Do not increase the dosage without first discussing it with your health care provider. ?If you are using self-injections, do injections as directed by your health care provider. Make sure you avoid any veins that are on the surface of the penis. After giving an injection, apply pressure to the injection site for 5 minutes. ?Talk to your health care provider about how to prevent headaches while taking ED medicines. These medicines may cause a sudden headache due to the increase in blood flow in your body. ?General instructions ?Exercise regularly, as directed by your health care provider. Work with your health care provider to lose weight, if needed. ?Do not use any products that contain nicotine or tobacco. These products include cigarettes, chewing tobacco, and vaping devices, such as e-cigarettes. If you need help quitting, ask your health care provider. ?Before using a vacuum pump, read the instructions that come with the pump and discuss any questions with your health care provider. ?Keep all follow-up visits. This is important. ?Contact a health care provider if: ?You feel nauseous. ?You are vomiting. ?You get sudden headaches while taking ED medicines. ?You have any concerns about your sexual health. ?Get help right away if: ?You are taking oral or injectable medicines and you have an erection that lasts longer than 4 hours. If your health care provider is unavailable, go to the nearest emergency room for evaluation. An erection that lasts much longer than 4 hours can result in permanent damage to your penis. ?You have severe pain in your groin or abdomen. ?You develop redness or severe swelling of your penis. ?You have redness spreading at your groin or lower abdomen. ?You are unable to urinate. ?You experience chest pain or a rapid heartbeat (palpitations) after taking oral  medicines. ?These symptoms may represent a serious problem that is an emergency. Do not wait to see if the symptoms will go away. Get medical help right away. Call your local emergency services (911 in the [place] Do not drive yourself to the hospital. ?Summary ?Erectile dysfunction (ED) is the inability to get or keep an erection during sexual intercourse. ?This condition is diagnosed based on a physical exam, your symptoms, and tests to determine the cause. Treatment varies depending on the cause and may include medicines, hormone therapy, surgery, or a vacuum pump. ?You may need follow-up visits to make sure that you are using your medicines or devices correctly. ?Get help right away if you are taking or injecting medicines and you have an erection that lasts longer than 4 hours. ?This information is not intended to replace advice given to you by your health care provider. Make sure you discuss any questions you have with your health care provider. ?Document Revised: 10/11/2020 Document Reviewed: 10/11/2020 ?Elsevier Patient Education ? 2023 [employer] ? ?

## 2022-05-15 NOTE — Progress Notes (Signed)
05/15/2022 9:45 AM   Taylor Mcdonald [id number] [id number]  Referring provider: Mosie Lukes, MD [address] [place],  [place] [address]  Followup hypogonadism and ED   HPI: Taylor Mcdonald is a 66yo here for followuop for Erectile dysfunction and hypogonadism. Testosterone 1100, hgb 15.8. PSA 1.4. CMP normal. He injects 200mg  every 2 weeks IM testosterone.  IPSS 9 QOL2 on no BPH therapy. Urine stream weaker since last visit which does not bother him. He uses sildenafil 60mg  PRN with good results    PMH: Past Medical History:  Diagnosis Date   Allergy    Anxiety 03/04/2012   Fatigue    GERD (gastroesophageal reflux disease)    Gout    Hyperglycemia    Hyperlipidemia    Hypertension    Insomnia    Internal nasal lesion 07/04/2016   Low testosterone    Lower back pain 09/09/2012   Migraine    Pain of left heel 10/01/2011   Preventative health care 07/24/2012   RLS (restless legs syndrome)    Sinusitis 03/04/2012   Varicose veins of both lower extremities 12/12/2016   Vasovagal near syncope 04/30/2013   Vitamin D deficiency 12/23/2011    Surgical History: Past Surgical History:  Procedure Laterality Date   LUMBAR LAMINECTOMY/DECOMPRESSION MICRODISCECTOMY N/A 05/13/2013   Procedure: LUMBAR LAMINECTOMY/DECOMPRESSION MICRODISCECTOMY 1 LEVEL L3-4;  Surgeon: Taylor Hai, MD;  Location: [facility];  Service: Orthopedics;  Laterality: N/A;   PANENDOSCOPY     TONSILLECTOMY      Home Medications:  Allergies as of 05/15/2022   No Known Allergies      Medication List        Accurate as of May 15, 2022  9:45 AM. If you have any questions, ask your nurse or doctor.          allopurinol 100 MG tablet Commonly known as: ZYLOPRIM Take 2 tablets (200 mg total) by mouth daily.   anastrozole 1 MG tablet Commonly known as: ARIMIDEX Take 1/2 tablet (0.5 mg total) by mouth daily.   aspirin 81 MG tablet Take 1 tablet (81 mg total) by mouth daily. Resume 4 days  post-op   atorvastatin 10 MG tablet Commonly known as: LIPITOR TAKE 1 TABLET BY MOUTH ONCE DAILY   Fish Oil + D3 1200-1000 MG-UNIT Caps Take 1 capsule by mouth daily.   fluticasone 50 MCG/ACT nasal spray Commonly known as: FLONASE PLACE 2 SPRAYS INTO THE NOSE DAILY.   lisinopril 20 MG tablet Commonly known as: ZESTRIL TAKE 1 TABLET (20 MG TOTAL) BY MOUTH DAILY.   omeprazole 20 MG capsule Commonly known as: PRILOSEC Take 1 capsule (20 mg total) by mouth daily.   OVER THE COUNTER MEDICATION 12 hr decongestant 120mg    propranolol 40 MG tablet Commonly known as: INDERAL TAKE 1 TABLET (40 MG TOTAL) BY MOUTH DAILY.   sildenafil 20 MG tablet Commonly known as: REVATIO Take 1 tablet (20 mg total) by mouth as needed.   testosterone cypionate 200 MG/ML injection Commonly known as: DEPOTESTOSTERONE CYPIONATE Inject 1 mL (200 mg total) into the skin every 14 (fourteen) days.   zolpidem 10 MG tablet Commonly known as: AMBIEN TAKE 1/2 TO 1 TABLET BY MOUTH Mcdonald BEDTIME AS NEEDED FOR SLEEP        Allergies: No Known Allergies  Family History: Family History  Problem Relation Age of Onset   Obesity Mother    Hypertension Son    Hypertension Maternal Grandmother  Obesity Maternal Grandmother    Cancer Maternal Grandmother 2       intestinal   Dementia Maternal Grandfather    Colon cancer Neg Hx    Esophageal cancer Neg Hx    Rectal cancer Neg Hx    Stomach cancer Neg Hx     Social History:  reports that he has never smoked. He has never used smokeless tobacco. He reports that he does not currently use drugs after having used the following drugs: Hydrocodone. He reports that he does not drink alcohol.  ROS: All other review of systems were reviewed and are negative except what is noted above in HPI  Physical Exam: BP 119/76   Pulse 66   Constitutional:  Alert and oriented, No acute distress. HEENT: Taylor Mcdonald, moist mucus membranes.  Trachea midline, no  masses. Cardiovascular: No clubbing, cyanosis, or edema. Respiratory: Normal respiratory effort, no increased work of breathing. GI: Abdomen is soft, nontender, nondistended, no abdominal masses GU: No CVA tenderness.  Lymph: No cervical or inguinal lymphadenopathy. Skin: No rashes, bruises or suspicious lesions. Neurologic: Grossly intact, no focal deficits, moving all 4 extremities. Psychiatric: Normal mood and affect.  Laboratory Data: Lab Results  Component Value Date   WBC 7.1 05/08/2022   HGB 15.8 05/08/2022   HCT 48.8 05/08/2022   MCV 87 05/08/2022   PLT 224.0 03/25/2022    Lab Results  Component Value Date   CREATININE 1.23 05/08/2022    Lab Results  Component Value Date   PSA 0.64 12/25/2017   PSA 0.63 11/23/2015   PSA 0.50 08/25/2014    Lab Results  Component Value Date   TESTOSTERONE 1,434 (H) 05/08/2022    Lab Results  Component Value Date   HGBA1C 5.6 03/25/2022    Urinalysis    Component Value Date/Time   COLORURINE YELLOW 12/25/2017 1037   APPEARANCEUR Clear 11/14/2021 1328   LABSPEC 1.010 12/25/2017 1037   PHURINE 6.0 12/25/2017 1037   GLUCOSEU Negative 11/14/2021 1328   GLUCOSEU NEGATIVE 12/25/2017 1037   HGBUR NEGATIVE 12/25/2017 1037   HGBUR trace-intact 07/25/2009 0000   BILIRUBINUR Negative 11/14/2021 1328   KETONESUR NEGATIVE 12/25/2017 1037   PROTEINUR Negative 11/14/2021 1328   UROBILINOGEN 0.2 12/25/2017 1037   NITRITE Negative 11/14/2021 1328   NITRITE NEGATIVE 12/25/2017 1037   LEUKOCYTESUR Negative 11/14/2021 1328    Lab Results  Component Value Date   LABMICR Comment 11/14/2021    Pertinent Imaging:  No results found for this or any previous visit.  No results found for this or any previous visit.  No results found for this or any previous visit.  No results found for this or any previous visit.  No results found for this or any previous visit.  No valid procedures specified. No results found for this or any  previous visit.  No results found for this or any previous visit.   Assessment & Plan:    1. Erectile dysfunction due to arterial insufficiency -We will trial tadalafil 20mg  prn - Urinalysis, Routine w reflex microscopic  2. Hypogonadism in male -COntinue IM testosterone 200mg  every 14 days -RCT 6 months with labs - Urinalysis, Routine w reflex microscopic   No follow-ups on file.  , MD  [facility] [place]

## 2022-05-16 LAB — URINALYSIS, ROUTINE W REFLEX MICROSCOPIC
Bilirubin, UA: NEGATIVE
Glucose, UA: NEGATIVE
Ketones, UA: NEGATIVE
Leukocytes,UA: NEGATIVE
Nitrite, UA: NEGATIVE
Protein,UA: NEGATIVE
RBC, UA: NEGATIVE
Specific Gravity, UA: 1.01 (ref 1.005–1.030)
Urobilinogen, Ur: 0.2 mg/dL (ref 0.2–1.0)
pH, UA: 6 (ref 5.0–7.5)

## 2022-06-05 ENCOUNTER — Other Ambulatory Visit (HOSPITAL_BASED_OUTPATIENT_CLINIC_OR_DEPARTMENT_OTHER): Payer: Self-pay

## 2022-06-05 ENCOUNTER — Other Ambulatory Visit: Payer: Self-pay | Admitting: Family Medicine

## 2022-06-05 MED ORDER — OMEPRAZOLE 20 MG PO CPDR
20.0000 mg | DELAYED_RELEASE_CAPSULE | Freq: Every day | ORAL | 1 refills | Status: DC
  Filled 2022-06-05: qty 90, 90d supply, fill #0
  Filled 2022-10-07 – 2022-10-09 (×3): qty 90, 90d supply, fill #1

## 2022-06-14 ENCOUNTER — Other Ambulatory Visit (HOSPITAL_BASED_OUTPATIENT_CLINIC_OR_DEPARTMENT_OTHER): Payer: Self-pay

## 2022-06-14 ENCOUNTER — Other Ambulatory Visit: Payer: Self-pay | Admitting: Family Medicine

## 2022-06-14 MED ORDER — PROPRANOLOL HCL 40 MG PO TABS
ORAL_TABLET | Freq: Every day | ORAL | 1 refills | Status: DC
  Filled 2022-06-14: qty 90, 90d supply, fill #0
  Filled 2022-09-24: qty 90, 90d supply, fill #1

## 2022-06-24 ENCOUNTER — Other Ambulatory Visit: Payer: Self-pay | Admitting: Family Medicine

## 2022-06-24 ENCOUNTER — Other Ambulatory Visit (HOSPITAL_BASED_OUTPATIENT_CLINIC_OR_DEPARTMENT_OTHER): Payer: Self-pay

## 2022-06-24 MED ORDER — LISINOPRIL 20 MG PO TABS
20.0000 mg | ORAL_TABLET | Freq: Every day | ORAL | 1 refills | Status: DC
  Filled 2022-06-24: qty 90, 90d supply, fill #0
  Filled 2022-09-24: qty 90, 90d supply, fill #1

## 2022-07-01 DIAGNOSIS — L821 Other seborrheic keratosis: Secondary | ICD-10-CM | POA: Diagnosis not present

## 2022-07-01 DIAGNOSIS — D1801 Hemangioma of skin and subcutaneous tissue: Secondary | ICD-10-CM | POA: Diagnosis not present

## 2022-07-01 DIAGNOSIS — L570 Actinic keratosis: Secondary | ICD-10-CM | POA: Diagnosis not present

## 2022-07-01 DIAGNOSIS — Z85828 Personal history of other malignant neoplasm of skin: Secondary | ICD-10-CM | POA: Diagnosis not present

## 2022-07-01 DIAGNOSIS — L814 Other melanin hyperpigmentation: Secondary | ICD-10-CM | POA: Diagnosis not present

## 2022-07-01 DIAGNOSIS — D235 Other benign neoplasm of skin of trunk: Secondary | ICD-10-CM | POA: Diagnosis not present

## 2022-07-02 ENCOUNTER — Other Ambulatory Visit (HOSPITAL_BASED_OUTPATIENT_CLINIC_OR_DEPARTMENT_OTHER): Payer: Self-pay

## 2022-07-11 ENCOUNTER — Other Ambulatory Visit: Payer: Self-pay | Admitting: Family Medicine

## 2022-07-11 DIAGNOSIS — G4700 Insomnia, unspecified: Secondary | ICD-10-CM

## 2022-07-11 NOTE — Telephone Encounter (Signed)
Requesting: Ambien 10mg   Contract: 03/25/22 UDS: 03/25/22 Last Visit: 03/25/22 Next Visit: 10/01/22 Last Refill: 11/15/21 #30 and 2RF  Please Advise

## 2022-07-12 ENCOUNTER — Other Ambulatory Visit (HOSPITAL_BASED_OUTPATIENT_CLINIC_OR_DEPARTMENT_OTHER): Payer: Self-pay

## 2022-07-12 MED ORDER — ZOLPIDEM TARTRATE 10 MG PO TABS
5.0000 mg | ORAL_TABLET | Freq: Every evening | ORAL | 5 refills | Status: DC | PRN
  Filled 2022-07-12: qty 30, 30d supply, fill #0
  Filled 2022-09-16: qty 30, 30d supply, fill #1
  Filled 2022-11-11: qty 30, 30d supply, fill #2

## 2022-07-31 ENCOUNTER — Other Ambulatory Visit (HOSPITAL_BASED_OUTPATIENT_CLINIC_OR_DEPARTMENT_OTHER): Payer: Self-pay

## 2022-07-31 ENCOUNTER — Other Ambulatory Visit: Payer: Self-pay | Admitting: Family Medicine

## 2022-07-31 DIAGNOSIS — Z8709 Personal history of other diseases of the respiratory system: Secondary | ICD-10-CM

## 2022-08-01 ENCOUNTER — Other Ambulatory Visit (HOSPITAL_BASED_OUTPATIENT_CLINIC_OR_DEPARTMENT_OTHER): Payer: Self-pay

## 2022-08-02 ENCOUNTER — Other Ambulatory Visit (HOSPITAL_BASED_OUTPATIENT_CLINIC_OR_DEPARTMENT_OTHER): Payer: Self-pay

## 2022-08-06 ENCOUNTER — Ambulatory Visit: Payer: Medicare PPO

## 2022-08-11 ENCOUNTER — Other Ambulatory Visit: Payer: Self-pay | Admitting: Urology

## 2022-08-11 ENCOUNTER — Other Ambulatory Visit: Payer: Self-pay | Admitting: Family Medicine

## 2022-08-12 ENCOUNTER — Other Ambulatory Visit: Payer: Self-pay

## 2022-08-12 ENCOUNTER — Other Ambulatory Visit (HOSPITAL_BASED_OUTPATIENT_CLINIC_OR_DEPARTMENT_OTHER): Payer: Self-pay

## 2022-08-12 MED ORDER — ALLOPURINOL 100 MG PO TABS
200.0000 mg | ORAL_TABLET | Freq: Every day | ORAL | 3 refills | Status: DC
  Filled 2022-08-12: qty 180, 90d supply, fill #0
  Filled 2023-03-18: qty 180, 90d supply, fill #1

## 2022-08-12 MED ORDER — ANASTROZOLE 1 MG PO TABS
0.5000 mg | ORAL_TABLET | Freq: Every day | ORAL | 3 refills | Status: DC
  Filled 2022-08-12: qty 45, 90d supply, fill #0
  Filled 2022-11-21: qty 45, 90d supply, fill #1
  Filled 2023-03-04: qty 45, 90d supply, fill #2
  Filled 2023-06-12: qty 45, 90d supply, fill #3

## 2022-08-14 ENCOUNTER — Encounter: Payer: Self-pay | Admitting: Gastroenterology

## 2022-09-02 ENCOUNTER — Other Ambulatory Visit (HOSPITAL_BASED_OUTPATIENT_CLINIC_OR_DEPARTMENT_OTHER): Payer: Self-pay

## 2022-09-16 ENCOUNTER — Other Ambulatory Visit: Payer: Self-pay

## 2022-09-24 ENCOUNTER — Other Ambulatory Visit: Payer: Self-pay | Admitting: Family Medicine

## 2022-09-25 ENCOUNTER — Other Ambulatory Visit (HOSPITAL_BASED_OUTPATIENT_CLINIC_OR_DEPARTMENT_OTHER): Payer: Self-pay

## 2022-09-25 ENCOUNTER — Other Ambulatory Visit: Payer: Self-pay

## 2022-09-25 MED ORDER — ATORVASTATIN CALCIUM 10 MG PO TABS
10.0000 mg | ORAL_TABLET | Freq: Every day | ORAL | 11 refills | Status: DC
  Filled 2022-09-25: qty 30, 30d supply, fill #0
  Filled 2022-10-28: qty 30, 30d supply, fill #1

## 2022-10-01 ENCOUNTER — Encounter: Payer: Self-pay | Admitting: Family Medicine

## 2022-10-01 ENCOUNTER — Other Ambulatory Visit (HOSPITAL_BASED_OUTPATIENT_CLINIC_OR_DEPARTMENT_OTHER): Payer: Self-pay

## 2022-10-01 ENCOUNTER — Ambulatory Visit (INDEPENDENT_AMBULATORY_CARE_PROVIDER_SITE_OTHER): Payer: Medicare PPO | Admitting: Family Medicine

## 2022-10-01 VITALS — BP 122/74 | HR 63 | Temp 98.0°F | Resp 16 | Ht 74.0 in | Wt 195.8 lb

## 2022-10-01 DIAGNOSIS — F419 Anxiety disorder, unspecified: Secondary | ICD-10-CM

## 2022-10-01 DIAGNOSIS — I10 Essential (primary) hypertension: Secondary | ICD-10-CM

## 2022-10-01 DIAGNOSIS — J329 Chronic sinusitis, unspecified: Secondary | ICD-10-CM

## 2022-10-01 DIAGNOSIS — G4700 Insomnia, unspecified: Secondary | ICD-10-CM

## 2022-10-01 DIAGNOSIS — E559 Vitamin D deficiency, unspecified: Secondary | ICD-10-CM | POA: Diagnosis not present

## 2022-10-01 DIAGNOSIS — M1A9XX0 Chronic gout, unspecified, without tophus (tophi): Secondary | ICD-10-CM | POA: Diagnosis not present

## 2022-10-01 DIAGNOSIS — E782 Mixed hyperlipidemia: Secondary | ICD-10-CM | POA: Diagnosis not present

## 2022-10-01 DIAGNOSIS — R739 Hyperglycemia, unspecified: Secondary | ICD-10-CM

## 2022-10-01 DIAGNOSIS — T7840XD Allergy, unspecified, subsequent encounter: Secondary | ICD-10-CM | POA: Diagnosis not present

## 2022-10-01 DIAGNOSIS — Z0000 Encounter for general adult medical examination without abnormal findings: Secondary | ICD-10-CM | POA: Diagnosis not present

## 2022-10-01 DIAGNOSIS — E291 Testicular hypofunction: Secondary | ICD-10-CM

## 2022-10-01 DIAGNOSIS — Z23 Encounter for immunization: Secondary | ICD-10-CM | POA: Diagnosis not present

## 2022-10-01 LAB — COMPREHENSIVE METABOLIC PANEL
ALT: 25 U/L (ref 0–53)
AST: 30 U/L (ref 0–37)
Albumin: 4.5 g/dL (ref 3.5–5.2)
Alkaline Phosphatase: 54 U/L (ref 39–117)
BUN: 17 mg/dL (ref 6–23)
CO2: 29 mEq/L (ref 19–32)
Calcium: 10.2 mg/dL (ref 8.4–10.5)
Chloride: 101 mEq/L (ref 96–112)
Creatinine, Ser: 1.06 mg/dL (ref 0.40–1.50)
GFR: 72.3 mL/min (ref >60.00)
Glucose, Bld: 100 mg/dL — ABNORMAL HIGH (ref 70–99)
Potassium: 4.5 mEq/L (ref 3.5–5.1)
Sodium: 138 mEq/L (ref 135–145)
Total Bilirubin: 1.1 mg/dL (ref 0.2–1.2)
Total Protein: 6.9 g/dL (ref 6.0–8.3)

## 2022-10-01 LAB — CBC WITH DIFFERENTIAL/PLATELET
Basophils Absolute: 0.1 10*3/uL (ref 0.0–0.1)
Basophils Relative: 0.8 % (ref 0.0–3.0)
Eosinophils Absolute: 0.2 10*3/uL (ref 0.0–0.7)
Eosinophils Relative: 3.3 % (ref 0.0–5.0)
HCT: 49.2 % (ref 39.0–52.0)
Hemoglobin: 16.6 g/dL (ref 13.0–17.0)
Lymphocytes Relative: 32.9 % (ref 12.0–46.0)
Lymphs Abs: 2.4 10*3/uL (ref 0.7–4.0)
MCHC: 33.8 g/dL (ref 30.0–36.0)
MCV: 86.6 fl (ref 78.0–100.0)
Monocytes Absolute: 0.7 10*3/uL (ref 0.1–1.0)
Monocytes Relative: 9.2 % (ref 3.0–12.0)
Neutro Abs: 3.9 10*3/uL (ref 1.4–7.7)
Neutrophils Relative %: 53.8 % (ref 43.0–77.0)
Platelets: 254 10*3/uL (ref 150.0–400.0)
RBC: 5.68 Mil/uL (ref 4.22–5.81)
RDW: 15 % (ref 11.5–15.5)
WBC: 7.3 10*3/uL (ref 4.0–10.5)

## 2022-10-01 LAB — LIPID PANEL
Cholesterol: 145 mg/dL (ref 0–200)
HDL: 51.6 mg/dL (ref >39.00)
LDL Cholesterol: 74 mg/dL (ref 0–99)
NonHDL: 93.47
Total CHOL/HDL Ratio: 3
Triglycerides: 95 mg/dL (ref 0.0–149.0)
VLDL: 19 mg/dL (ref 0.0–40.0)

## 2022-10-01 LAB — VITAMIN D 25 HYDROXY (VIT D DEFICIENCY, FRACTURES): VITD: 57.92 ng/mL (ref 30.00–100.00)

## 2022-10-01 LAB — TSH: TSH: 0.6 u[IU]/mL (ref 0.35–5.50)

## 2022-10-01 LAB — URIC ACID: Uric Acid, Serum: 6.6 mg/dL (ref 4.0–7.8)

## 2022-10-01 LAB — HEMOGLOBIN A1C: Hgb A1c MFr Bld: 5.4 % (ref 4.6–6.5)

## 2022-10-01 MED ORDER — FLUTICASONE PROPIONATE 50 MCG/ACT NA SUSP
2.0000 | Freq: Every day | NASAL | 5 refills | Status: DC
  Filled 2022-10-01 – 2022-10-09 (×2): qty 16, 30d supply, fill #0
  Filled 2022-10-09: qty 48, 90d supply, fill #0
  Filled 2023-01-09: qty 48, 90d supply, fill #1

## 2022-10-01 NOTE — Assessment & Plan Note (Signed)
Tolerating statin, encouraged heart healthy diet, avoid trans fats, minimize simple carbs and saturated fats. Increase exercise as tolerated 

## 2022-10-01 NOTE — Assessment & Plan Note (Signed)
Hydrate and monitor 

## 2022-10-01 NOTE — Assessment & Plan Note (Signed)
Well controlled, no changes to meds. Encouraged heart healthy diet such as the DASH diet and exercise as tolerated.  °

## 2022-10-01 NOTE — Assessment & Plan Note (Signed)
Use Flonase daily and nasal saline and Cetirizine prn

## 2022-10-01 NOTE — Assessment & Plan Note (Signed)
Supplement and monitor 

## 2022-10-01 NOTE — Progress Notes (Signed)
Subjective:   By signing my name below, I, Kellie Simmering, attest that this documentation has been prepared under the direction and in the presence of Mosie Lukes, MD., 10/01/2022.   Patient ID: Taylor Mcdonald, male    DOB: 1955-12-23, 67 y.o.   MRN: [id number]  Chief Complaint  Patient presents with   Annual Exam    Annual Exam    HPI Patient is in today for a comprehensive physical exam and follow up on chronic medical concerns. He denies CP/palpitations/SOB/HA/congestion/fever/ chills/GI or GU symptoms.  Allergic Rhinitis He states that he occasionally experiences flares in his allergies and is requesting a refill on Fluticasone nasal spray, which he continues using daily.  Diet/Exercise He is attempting to maintain a balanced diet and is exercising daily at the [facility]  Hypertension Hypertension is well-controlled. He continues taking Lisinopril 20 mg daily.  BP Readings from Last 3 Encounters:  10/01/22 122/74  05/15/22 119/76  03/25/22 110/72   Pulse Readings from Last 3 Encounters:  10/01/22 63  05/15/22 66  03/25/22 (!) 59   Supplements He has started taking fiber supplements, multivitamins, and probiotics daily.  Vision Changes He reports having decreased visual acuity and is interested in following with ophthalmology.  Past Medical History:  Diagnosis Date   Allergy    Anxiety 03/04/2012   Fatigue    GERD (gastroesophageal reflux disease)    Gout    Hyperglycemia    Hyperlipidemia    Hypertension    Insomnia    Internal nasal lesion 07/04/2016   Low testosterone    Lower back pain 09/09/2012   Migraine    Pain of left heel 10/01/2011   Preventative health care 07/24/2012   RLS (restless legs syndrome)    Sinusitis 03/04/2012   Varicose veins of both lower extremities 12/12/2016   Vasovagal near syncope 04/30/2013   Vitamin D deficiency 12/23/2011    Past Surgical History:  Procedure Laterality Date   LUMBAR LAMINECTOMY/DECOMPRESSION MICRODISCECTOMY  N/A 05/13/2013   Procedure: LUMBAR LAMINECTOMY/DECOMPRESSION MICRODISCECTOMY 1 LEVEL L3-4;  Surgeon: Johnn Hai, MD;  Location: [facility];  Service: Orthopedics;  Laterality: N/A;   PANENDOSCOPY     TONSILLECTOMY      Family History  Problem Relation Age of Onset   Obesity Mother    Hypertension Son    Hypertension Maternal Grandmother    Obesity Maternal Grandmother    Cancer Maternal Grandmother 62       intestinal   Dementia Maternal Grandfather    Colon cancer Neg Hx    Esophageal cancer Neg Hx    Rectal cancer Neg Hx    Stomach cancer Neg Hx     Social History   Socioeconomic History   Marital status: Married    Spouse name: Not on file   Number of children: Not on file   Years of education: Not on file   Highest education level: Not on file  Occupational History   Not on file  Tobacco Use   Smoking status: Never   Smokeless tobacco: Never  Vaping Use   Vaping Use: Never used  Substance and Sexual Activity   Alcohol use: No   Drug use: Not Currently    Types: Hydrocodone    Comment: Pt states he does not use anymore.   Sexual activity: Yes  Other Topics Concern   Not on file  Social History Narrative   Not on file   Social Determinants of Health   Financial Resource Strain:  Low Risk  (08/03/2021)   Overall Financial Resource Strain (CARDIA)    Difficulty of Paying Living Expenses: Not hard at all  Food Insecurity: No Food Insecurity (08/03/2021)   Hunger Vital Sign    Worried About Running Out of Food in the Last Year: Never true    Ran Out of Food in the Last Year: Never true  Transportation Needs: No Transportation Needs (08/03/2021)   PRAPARE - Hydrologist (Medical): No    Lack of Transportation (Non-Medical): No  Physical Activity: Insufficiently Active (08/03/2021)   Exercise Vital Sign    Days of Exercise per Week: 4 days    Minutes of Exercise per Session: 30 min  Stress: No Stress Concern Present (08/03/2021)   [place]    Feeling of Stress : Not at all  Social Connections: Moderately Integrated (08/03/2021)   Social Connection and Isolation Panel [NHANES]    Frequency of Communication with Friends and Family: More than three times a week    Frequency of Social Gatherings with Friends and Family: More than three times a week    Attends Religious Services: More than 4 times per year    Active Member of [employer] or Organizations: No    Attends Archivist Meetings: Never    Marital Status: Married  Human resources officer Violence: Not At Risk (08/03/2021)   Humiliation, Afraid, Rape, and Kick questionnaire    Fear of Current or Ex-Partner: No    Emotionally Abused: No    Physically Abused: No    Sexually Abused: No    Outpatient Medications Prior to Visit  Medication Sig Dispense Refill   allopurinol (ZYLOPRIM) 100 MG tablet Take 2 tablets (200 mg total) by mouth daily. 180 tablet 3   anastrozole (ARIMIDEX) 1 MG tablet Take 1/2 tablet (0.5 mg total) by mouth daily. 45 tablet 3   aspirin 81 MG tablet Take 1 tablet (81 mg total) by mouth daily. Resume 4 days post-op 30 tablet    atorvastatin (LIPITOR) 10 MG tablet Take 1 tablet (10 mg total) by mouth daily. 30 tablet 11   Fish Oil-Cholecalciferol (FISH OIL + D3) 1200-1000 MG-UNIT CAPS Take 1 capsule by mouth daily.     lisinopril (ZESTRIL) 20 MG tablet Take 1 tablet (20 mg total) by mouth daily. 90 tablet 1   omeprazole (PRILOSEC) 20 MG capsule Take 1 capsule (20 mg total) by mouth daily. 90 capsule 1   OVER THE COUNTER MEDICATION 12 hr decongestant 120mg      propranolol (INDERAL) 40 MG tablet TAKE 1 TABLET (40 MG TOTAL) BY MOUTH DAILY. 90 tablet 1   sildenafil (REVATIO) 20 MG tablet Take 1 tablet (20 mg total) by mouth as needed. 90 tablet 3   tadalafil (CIALIS) 20 MG tablet Take 1 tablet (20 mg total) by mouth as needed. 30 tablet 5   testosterone cypionate (DEPOTESTOSTERONE CYPIONATE) 200  MG/ML injection Inject 1 mL (200 mg total) into the skin every 14 (fourteen) days. 10 mL 3   zolpidem (AMBIEN) 10 MG tablet Take 0.5-1 tablets (5-10 mg total) by mouth at bedtime as needed. 30 tablet 5   fluticasone (FLONASE) 50 MCG/ACT nasal spray PLACE 2 SPRAYS INTO THE NOSE DAILY. 48 g 3   No facility-administered medications prior to visit.    No Known Allergies  Review of Systems  Constitutional:  Negative for chills and fever.  HENT:  Negative for congestion.   Eyes:        (+)  Decreased visual acuity.  Respiratory:  Negative for shortness of breath.   Cardiovascular:  Negative for chest pain and palpitations.  Gastrointestinal:  Negative for abdominal pain, blood in stool, constipation, diarrhea, nausea and vomiting.  Genitourinary:  Negative for dysuria, frequency, hematuria and urgency.  Skin:           Neurological:  Negative for headaches.       Objective:    Physical Exam Constitutional:      General: He is not in acute distress.    Appearance: Normal appearance. He is normal weight. He is not ill-appearing.  HENT:     Head: Normocephalic and atraumatic.     Right Ear: Tympanic membrane, ear canal and external ear normal.     Left Ear: Tympanic membrane, ear canal and external ear normal.     Nose: Nose normal.     Mouth/Throat:     Mouth: Mucous membranes are moist.     Pharynx: Oropharynx is clear.  Eyes:     General:        Right eye: No discharge.        Left eye: No discharge.     Extraocular Movements: Extraocular movements intact.     Right eye: No nystagmus.     Left eye: No nystagmus.     Pupils: Pupils are equal, round, and reactive to light.  Neck:     Vascular: No carotid bruit.  Cardiovascular:     Rate and Rhythm: Normal rate and regular rhythm.     Pulses: Normal pulses.     Heart sounds: Normal heart sounds. No murmur heard.    No gallop.  Pulmonary:     Effort: Pulmonary effort is normal. No respiratory distress.     Breath sounds:  Normal breath sounds. No wheezing or rales.  Abdominal:     General: Bowel sounds are normal.     Palpations: Abdomen is soft.     Tenderness: There is no abdominal tenderness. There is no guarding.  Musculoskeletal:        General: Normal range of motion.     Cervical back: Normal range of motion.     Right lower leg: No edema.     Left lower leg: No edema.     Comments: Muscle strength 5/5 on upper and lower extremities.   Lymphadenopathy:     Cervical: No cervical adenopathy.  Skin:    General: Skin is warm and dry.  Neurological:     Mental Status: He is alert and oriented to person, place, and time.     Sensory: Sensation is intact.     Motor: Motor function is intact.     Coordination: Coordination is intact.     Deep Tendon Reflexes:     Reflex Scores:      Patellar reflexes are 2+ on the right side and 2+ on the left side. Psychiatric:        Mood and Affect: Mood normal.        Behavior: Behavior normal.        Judgment: Judgment normal.     BP 122/74 (BP Location: Right Arm, Patient Position: Sitting, Cuff Size: Normal)   Pulse 63   Temp 98 F (36.7 C) (Oral)   Resp 16   Ht 6\' 2"  (1.88 m)   Wt 195 lb 12.8 oz (88.8 kg)   SpO2 95%   BMI 25.14 kg/m  Wt Readings from Last 3 Encounters:  10/01/22 195 lb 12.8 oz (  88.8 kg)  03/25/22 197 lb 3.2 oz (89.4 kg)  09/27/21 192 lb (87.1 kg)    Diabetic Foot Exam - Simple   No data filed    Lab Results  Component Value Date   WBC 7.3 10/01/2022   HGB 16.6 10/01/2022   HCT 49.2 10/01/2022   PLT 254.0 10/01/2022   GLUCOSE 100 (H) 10/01/2022   CHOL 145 10/01/2022   TRIG 95.0 10/01/2022   HDL 51.60 10/01/2022   LDLDIRECT 125.7 02/10/2013   LDLCALC 74 10/01/2022   ALT 25 10/01/2022   AST 30 10/01/2022   NA 138 10/01/2022   K 4.5 10/01/2022   CL 101 10/01/2022   CREATININE 1.06 10/01/2022   BUN 17 10/01/2022   CO2 29 10/01/2022   TSH 0.60 10/01/2022   PSA 0.64 12/25/2017   HGBA1C 5.4 10/01/2022    MICROALBUR <0.7 12/25/2017    Lab Results  Component Value Date   TSH 0.60 10/01/2022   Lab Results  Component Value Date   WBC 7.3 10/01/2022   HGB 16.6 10/01/2022   HCT 49.2 10/01/2022   MCV 86.6 10/01/2022   PLT 254.0 10/01/2022   Lab Results  Component Value Date   NA 138 10/01/2022   K 4.5 10/01/2022   CO2 29 10/01/2022   GLUCOSE 100 (H) 10/01/2022   BUN 17 10/01/2022   CREATININE 1.06 10/01/2022   BILITOT 1.1 10/01/2022   ALKPHOS 54 10/01/2022   AST 30 10/01/2022   ALT 25 10/01/2022   PROT 6.9 10/01/2022   ALBUMIN 4.5 10/01/2022   CALCIUM 10.2 10/01/2022   EGFR 64 05/08/2022   GFR 72.30 10/01/2022   Lab Results  Component Value Date   CHOL 145 10/01/2022   Lab Results  Component Value Date   HDL 51.60 10/01/2022   Lab Results  Component Value Date   LDLCALC 74 10/01/2022   Lab Results  Component Value Date   TRIG 95.0 10/01/2022   Lab Results  Component Value Date   CHOLHDL 3 10/01/2022   Lab Results  Component Value Date   HGBA1C 5.4 10/01/2022      Assessment & Plan:  Colonoscopy: Last completed on 09/09/2016. Impression: -Internal hemorrhoids. -Diverticulosis in the sigmoid colon. -The examination was otherwise normal on direct and retroflexion views. Repeat in 10 years.  PSA: Last checked on 05/08/2022 at 1.4 ng/mL. Repeat in 1-2 years.  Advanced Directives: Encouraged patient to complete advanced care planning documents.  Healthy Lifestyle: Encouraged adequate sleep, heart healthy diet, 60-80 oz of non-caffeinated fluids, and minimum of 4000 steps daily.  Immunizations: Reviewed immunization history and encouraged COVID-19, Flu, Prevnar 67, RSV, and Shingles vaccinations. Flu vaccination administered today in office. Patient declines all other offered vaccinations at this time.  Allergic Rhinitis: Recommended prn use of otc cetirizine and nasal saline. Fluticasone nasal spray refilled.  Hypertension: Well-controlled with Lisinopril  20 mg.  Supplements: Recommended magnesium glycinate 200-400 mg at bedtime. Problem List Items Addressed This Visit     Allergy    Use Flonase daily and nasal saline and Cetirizine prn      Anxiety    Stable off of meds and retired      Gout    Hydrate and monitor       Relevant Orders   Uric acid (Completed)   Hyperglycemia - Primary    hgba1c acceptable, minimize simple carbs. Increase exercise as tolerated.       Relevant Orders   Hemoglobin A1c (Completed)   Hyperlipidemia  Tolerating statin, encouraged heart healthy diet, avoid trans fats, minimize simple carbs and saturated fats. Increase exercise as tolerated       Relevant Orders   Lipid panel (Completed)   Hypertension    Well controlled, no changes to meds. Encouraged heart healthy diet such as the DASH diet and exercise as tolerated.        Relevant Orders   CBC with Differential/Platelet (Completed)   Comprehensive metabolic panel (Completed)   TSH (Completed)   Hypogonadism in male    Supplement and monitor       Insomnia    Encouraged good sleep hygiene such as dark, quiet room. No blue/green glowing lights such as computer screens in bedroom. No alcohol or stimulants in evening. Cut down on caffeine as able. Regular exercise is helpful but not just prior to bed time.  May continue Ambien prn      Preventative health care    Patient encouraged to maintain heart healthy diet, regular exercise, adequate sleep. Consider daily probiotics. Take medications as prescribed. Labs ordered and reviewed. Given and reviewed copy of ACP documents from [employer] and encouraged to complete and return.  Colonoscopy 2018 and 2028      Sinusitis    Recurrent, Encouraged increased rest and hydration, add probiotics, zinc such as Coldeze or Xicam. Treat fevers as needed, given a prescription for Cefdinir 300 mg po bid      Relevant Medications   fluticasone (FLONASE) 50 MCG/ACT nasal spray   Vitamin D  deficiency    Supplement and monitor       Relevant Orders   VITAMIN D 25 Hydroxy (Vit-D Deficiency, Fractures) (Completed)   Other Visit Diagnoses     Need for influenza vaccination       Relevant Orders   Flu Vaccine QUAD High Dose(Fluad) (Completed)      Meds ordered this encounter  Medications   fluticasone (FLONASE) 50 MCG/ACT nasal spray    Sig: Place 2 sprays into both nostrils daily.    Dispense:  16 g    Refill:  5   I, Penni Homans, MD, personally preformed the services described in this documentation.  All medical record entries made by the scribe were at my direction and in my presence.  I have reviewed the chart and discharge instructions (if applicable) and agree that the record reflects my personal performance and is accurate and complete. 10/01/2022  I,Mohammed Iqbal,acting as a scribe for Penni Homans, MD.,have documented all relevant documentation on the behalf of Penni Homans, MD,as directed by  Penni Homans, MD while in the presence of Penni Homans, MD.  Penni Homans, MD

## 2022-10-01 NOTE — Assessment & Plan Note (Addendum)
Patient encouraged to maintain heart healthy diet, regular exercise, adequate sleep. Consider daily probiotics. Take medications as prescribed. Labs ordered and reviewed. Given and reviewed copy of ACP documents from [employer] and encouraged to complete and return.  Colonoscopy 2018 and 2028

## 2022-10-01 NOTE — Patient Instructions (Addendum)
Magnesium Glycinate 200 to 400 mg at bedtime Multivitamin with minerals  4000 steps, 8000 steps Hydrate minimum 60-80 ounces of non caffeine and non alcohol beverages a day, if you enjoy one of those then increase your water intake by one beverage.    Shingrix is the new shingles shot, 2 shots over 2-6 months, confirm coverage with insurance and document, then can return here for shots with nurse appt or at pharmacy   RSV, Respiratory Syncitial Virus vaccine, Arexvy at pharmacy   Prevnar 20 once  Annual flu and COVID boosters       Preventive Care 65 Years and Older, Male Preventive care refers to lifestyle choices and visits with your health care provider that can promote health and wellness. Preventive care visits are also called wellness exams. What can I expect for my preventive care visit? Counseling During your preventive care visit, your health care provider may ask about your: Medical history, including: Past medical problems. Family medical history. History of falls. Current health, including: Emotional well-being. Home life and relationship well-being. Sexual activity. Memory and ability to understand (cognition). Lifestyle, including: Alcohol, nicotine or tobacco, and drug use. Access to firearms. Diet, exercise, and sleep habits. Work and work Statistician. Sunscreen use. Safety issues such as seatbelt and bike helmet use. Physical exam Your health care provider will check your: Height and weight. These may be used to calculate your BMI (body mass index). BMI is a measurement that tells if you are at a healthy weight. Waist circumference. This measures the distance around your waistline. This measurement also tells if you are at a healthy weight and may help predict your risk of certain diseases, such as type 2 diabetes and high blood pressure. Heart rate and blood pressure. Body temperature. Skin for abnormal spots. What immunizations do I need?  Vaccines  are usually given at various ages, according to a schedule. Your health care provider will recommend vaccines for you based on your age, medical history, and lifestyle or other factors, such as travel or where you work. What tests do I need? Screening Your health care provider may recommend screening tests for certain conditions. This may include: Lipid and cholesterol levels. Diabetes screening. This is done by checking your blood sugar (glucose) after you have not eaten for a while (fasting). Hepatitis C test. Hepatitis B test. HIV (human immunodeficiency virus) test. STI (sexually transmitted infection) testing, if you are at risk. Lung cancer screening. Colorectal cancer screening. Prostate cancer screening. Abdominal aortic aneurysm (AAA) screening. You may need this if you are a current or former smoker. Talk with your health care provider about your test results, treatment options, and if necessary, the need for more tests. Follow these instructions at home: Eating and drinking  Eat a diet that includes fresh fruits and vegetables, whole grains, lean protein, and low-fat dairy products. Limit your intake of foods with high amounts of sugar, saturated fats, and salt. Take vitamin and mineral supplements as recommended by your health care provider. Do not drink alcohol if your health care provider tells you not to drink. If you drink alcohol: Limit how much you have to 0-2 drinks a day. Know how much alcohol is in your drink. In the [place] one drink equals one 12 oz bottle of beer (355 mL), one 5 oz glass of wine (148 mL), or one 1 oz glass of hard liquor (44 mL). Lifestyle Brush your teeth every morning and night with fluoride toothpaste. Floss one time each day. Exercise for  at least 30 minutes 5 or more days each week. Do not use any products that contain nicotine or tobacco. These products include cigarettes, chewing tobacco, and vaping devices, such as e-cigarettes. If you need  help quitting, ask your health care provider. Do not use drugs. If you are sexually active, practice safe sex. Use a condom or other form of protection to prevent STIs. Take aspirin only as told by your health care provider. Make sure that you understand how much to take and what form to take. Work with your health care provider to find out whether it is safe and beneficial for you to take aspirin daily. Ask your health care provider if you need to take a cholesterol-lowering medicine (statin). Find healthy ways to manage stress, such as: Meditation, yoga, or listening to music. Journaling. Talking to a trusted person. Spending time with friends and family. Safety Always wear your seat belt while driving or riding in a vehicle. Do not drive: If you have been drinking alcohol. Do not ride with someone who has been drinking. When you are tired or distracted. While texting. If you have been using any mind-altering substances or drugs. Wear a helmet and other protective equipment during sports activities. If you have firearms in your house, make sure you follow all gun safety procedures. Minimize exposure to UV radiation to reduce your risk of skin cancer. What's next? Visit your health care provider once a year for an annual wellness visit. Ask your health care provider how often you should have your eyes and teeth checked. Stay up to date on all vaccines. This information is not intended to replace advice given to you by your health care provider. Make sure you discuss any questions you have with your health care provider. Document Revised: 01/10/2021 Document Reviewed: 01/10/2021 Elsevier Patient Education  [place].

## 2022-10-01 NOTE — Assessment & Plan Note (Signed)
hgba1c acceptable, minimize simple carbs. Increase exercise as tolerated.  

## 2022-10-01 NOTE — Assessment & Plan Note (Signed)
Recurrent, Encouraged increased rest and hydration, add probiotics, zinc such as Coldeze or Xicam. Treat fevers as needed, given a prescription for Cefdinir 300 mg po bid

## 2022-10-04 NOTE — Assessment & Plan Note (Signed)
Stable off of meds and retired

## 2022-10-04 NOTE — Assessment & Plan Note (Signed)
Encouraged good sleep hygiene such as dark, quiet room. No blue/green glowing lights such as computer screens in bedroom. No alcohol or stimulants in evening. Cut down on caffeine as able. Regular exercise is helpful but not just prior to bed time.  May continue Ambien prn

## 2022-10-07 ENCOUNTER — Telehealth: Payer: Self-pay | Admitting: Family Medicine

## 2022-10-07 NOTE — Telephone Encounter (Signed)
Contacted [name] to schedule their annual wellness visit. Appointment made for 10/16/2022.  Sherol Dade; Care Guide Ambulatory Clinical [facility] Direct Dial: [phone]

## 2022-10-08 ENCOUNTER — Other Ambulatory Visit (HOSPITAL_BASED_OUTPATIENT_CLINIC_OR_DEPARTMENT_OTHER): Payer: Self-pay

## 2022-10-09 ENCOUNTER — Other Ambulatory Visit (HOSPITAL_BASED_OUTPATIENT_CLINIC_OR_DEPARTMENT_OTHER): Payer: Self-pay

## 2022-10-16 ENCOUNTER — Ambulatory Visit: Payer: Medicare PPO

## 2022-11-01 ENCOUNTER — Telehealth: Payer: Self-pay | Admitting: Family Medicine

## 2022-11-01 NOTE — Telephone Encounter (Signed)
Copied from CRM [id number]. Topic: Medicare AWV >> Nov 01, 2022 11:16 AM Payton Doughty wrote: Reason for CRM: Called patient to schedule Medicare Annual Wellness Visit (AWV). Left message for patient to call back and schedule Medicare Annual Wellness Visit (AWV).  Last date of AWV: 08/03/21  Please schedule an appointment at any time with Kandis Cocking, LPN    If any questions, please contact me.  Thank you ,  Verlee Rossetti; Care Guide Ambulatory Clinical Support [facility] Direct Dial: [phone]

## 2022-11-06 ENCOUNTER — Other Ambulatory Visit: Payer: Medicare PPO

## 2022-11-06 DIAGNOSIS — E291 Testicular hypofunction: Secondary | ICD-10-CM | POA: Diagnosis not present

## 2022-11-10 LAB — TESTOSTERONE,FREE AND TOTAL
Testosterone, Free: 3.2 pg/mL — ABNORMAL LOW (ref 6.6–18.1)
Testosterone: 252 ng/dL — ABNORMAL LOW (ref 264–916)

## 2022-11-11 ENCOUNTER — Other Ambulatory Visit: Payer: Self-pay

## 2022-11-11 ENCOUNTER — Other Ambulatory Visit (HOSPITAL_BASED_OUTPATIENT_CLINIC_OR_DEPARTMENT_OTHER): Payer: Self-pay

## 2022-11-13 ENCOUNTER — Encounter: Payer: Self-pay | Admitting: Urology

## 2022-11-13 ENCOUNTER — Other Ambulatory Visit (HOSPITAL_BASED_OUTPATIENT_CLINIC_OR_DEPARTMENT_OTHER): Payer: Self-pay

## 2022-11-13 ENCOUNTER — Ambulatory Visit (INDEPENDENT_AMBULATORY_CARE_PROVIDER_SITE_OTHER): Payer: Medicare PPO | Admitting: Urology

## 2022-11-13 VITALS — BP 133/86 | HR 76

## 2022-11-13 DIAGNOSIS — N5201 Erectile dysfunction due to arterial insufficiency: Secondary | ICD-10-CM | POA: Diagnosis not present

## 2022-11-13 DIAGNOSIS — E291 Testicular hypofunction: Secondary | ICD-10-CM

## 2022-11-13 MED ORDER — SILDENAFIL CITRATE 20 MG PO TABS: 20.0000 mg | ORAL_TABLET | ORAL | 3 refills | Status: AC | PRN

## 2022-11-13 MED ORDER — TESTOSTERONE CYPIONATE 200 MG/ML IM SOLN
200.0000 mg | INTRAMUSCULAR | 3 refills | Status: DC
  Filled 2022-11-13: qty 10, 140d supply, fill #0
  Filled 2022-12-23 – 2022-12-25 (×3): qty 6, 84d supply, fill #0
  Filled 2023-03-12: qty 6, 84d supply, fill #1

## 2022-11-13 MED ORDER — SILDENAFIL CITRATE 20 MG PO TABS
20.0000 mg | ORAL_TABLET | ORAL | 3 refills | Status: DC | PRN
  Filled 2022-11-13: qty 30, 30d supply, fill #0

## 2022-11-13 NOTE — Progress Notes (Signed)
11/13/2022 1:42 PM   Taylor Mcdonald 1956/05/02 [id number]  Referring provider: Bradd Canary, MD [address] [place],  [place] [address]  Followup erectile dysfunction and hypogonadism   HPI: Taylor Mcdonald is a 67yo here for followup for hypogonadism and ED. Testosterone 252 but this was 3 weeks after his injection. Hgb 16.6. Energy is good. Libido is good. He uses sildenafil prn with good resulkts.,    PMH: Past Medical History:  Diagnosis Date   Allergy    Anxiety 03/04/2012   Fatigue    GERD (gastroesophageal reflux disease)    Gout    Hyperglycemia    Hyperlipidemia    Hypertension    Insomnia    Internal nasal lesion 07/04/2016   Low testosterone    Lower back pain 09/09/2012   Migraine    Pain of left heel 10/01/2011   Preventative health care 07/24/2012   RLS (restless legs syndrome)    Sinusitis 03/04/2012   Varicose veins of both lower extremities 12/12/2016   Vasovagal near syncope 04/30/2013   Vitamin D deficiency 12/23/2011    Surgical History: Past Surgical History:  Procedure Laterality Date   LUMBAR LAMINECTOMY/DECOMPRESSION MICRODISCECTOMY N/A 05/13/2013   Procedure: LUMBAR LAMINECTOMY/DECOMPRESSION MICRODISCECTOMY 1 LEVEL L3-4;  Surgeon: Javier Docker, MD;  Location: [facility];  Service: Orthopedics;  Laterality: N/A;   PANENDOSCOPY     TONSILLECTOMY      Home Medications:  Allergies as of 11/13/2022   No Known Allergies      Medication List        Accurate as of November 13, 2022  1:42 PM. If you have any questions, ask your nurse or doctor.          allopurinol 100 MG tablet Commonly known as: ZYLOPRIM Take 2 tablets (200 mg total) by mouth daily.   anastrozole 1 MG tablet Commonly known as: ARIMIDEX Take 1/2 tablet (0.5 mg total) by mouth daily.   aspirin 81 MG tablet Take 1 tablet (81 mg total) by mouth daily. Resume 4 days post-op   atorvastatin 10 MG tablet Commonly known as: LIPITOR Take 1 tablet (10 mg total) by  mouth daily.   Fish Oil + D3 1200-1000 MG-UNIT Caps Take 1 capsule by mouth daily.   fluticasone 50 MCG/ACT nasal spray Commonly known as: FLONASE Place 2 sprays into both nostrils daily.   lisinopril 20 MG tablet Commonly known as: ZESTRIL Take 1 tablet (20 mg total) by mouth daily.   omeprazole 20 MG capsule Commonly known as: PRILOSEC Take 1 capsule (20 mg total) by mouth daily.   OVER THE COUNTER MEDICATION 12 hr decongestant    propranolol 40 MG tablet Commonly known as: INDERAL TAKE 1 TABLET (40 MG TOTAL) BY MOUTH DAILY.   sildenafil 20 MG tablet Commonly known as: REVATIO Take 1 tablet (20 mg total) by mouth as needed.   tadalafil 20 MG tablet Commonly known as: CIALIS Take 1 tablet (20 mg total) by mouth as needed.   testosterone cypionate 200 MG/ML injection Commonly known as: DEPOTESTOSTERONE CYPIONATE Inject 1 mL (200 mg total) into the skin every 14 (fourteen) days.   zolpidem 10 MG tablet Commonly known as: AMBIEN Take 0.5-1 tablets (5-10 mg total) by mouth at bedtime as needed.        Allergies: No Known Allergies  Family History: Family History  Problem Relation Age of Onset   Obesity Mother    Hypertension Son    Hypertension Maternal Grandmother  Obesity Maternal Grandmother    Cancer Maternal Grandmother 99       intestinal   Dementia Maternal Grandfather    Colon cancer Neg Hx    Esophageal cancer Neg Hx    Rectal cancer Neg Hx    Stomach cancer Neg Hx     Social History:  reports that he has never smoked. He has never used smokeless tobacco. He reports that he does not currently use drugs after having used the following drugs: Hydrocodone. He reports that he does not drink alcohol.  ROS: All other review of systems were reviewed and are negative except what is noted above in HPI  Physical Exam: BP 133/86   Pulse 76   Constitutional:  Alert and oriented, No acute distress. HEENT: [place] AT, moist mucus membranes.  Trachea  midline, no masses. Cardiovascular: No clubbing, cyanosis, or edema. Respiratory: Normal respiratory effort, no increased work of breathing. GI: Abdomen is soft, nontender, nondistended, no abdominal masses GU: No CVA tenderness.  Lymph: No cervical or inguinal lymphadenopathy. Skin: No rashes, bruises or suspicious lesions. Neurologic: Grossly intact, no focal deficits, moving all 4 extremities. Psychiatric: Normal mood and affect.  Laboratory Data: Lab Results  Component Value Date   WBC 7.3 10/01/2022   HGB 16.6 10/01/2022   HCT 49.2 10/01/2022   MCV 86.6 10/01/2022   PLT 254.0 10/01/2022    Lab Results  Component Value Date   CREATININE 1.06 10/01/2022    Lab Results  Component Value Date   PSA 0.64 12/25/2017   PSA 0.63 11/23/2015   PSA 0.50 08/25/2014    Lab Results  Component Value Date   TESTOSTERONE 252 (L) 11/06/2022    Lab Results  Component Value Date   HGBA1C 5.4 10/01/2022    Urinalysis    Component Value Date/Time   COLORURINE YELLOW 12/25/2017 1037   APPEARANCEUR Clear 05/15/2022 1027   LABSPEC 1.010 12/25/2017 1037   PHURINE 6.0 12/25/2017 1037   GLUCOSEU Negative 05/15/2022 1027   GLUCOSEU NEGATIVE 12/25/2017 1037   HGBUR NEGATIVE 12/25/2017 1037   HGBUR trace-intact 07/25/2009 0000   BILIRUBINUR Negative 05/15/2022 1027   KETONESUR NEGATIVE 12/25/2017 1037   PROTEINUR Negative 05/15/2022 1027   UROBILINOGEN 0.2 12/25/2017 1037   NITRITE Negative 05/15/2022 1027   NITRITE NEGATIVE 12/25/2017 1037   LEUKOCYTESUR Negative 05/15/2022 1027    Lab Results  Component Value Date   LABMICR Comment 05/15/2022    Pertinent Imaging:  No results found for this or any previous visit.  No results found for this or any previous visit.  No results found for this or any previous visit.  No results found for this or any previous visit.  No results found for this or any previous visit.  No valid procedures specified. No results found for  this or any previous visit.  No results found for this or any previous visit.   Assessment & Plan:    1. Hypogonadism in male -continue IM testosterone  every 14 days -followup 1 year with testosterone labs  2. Erectile dysfunction due to arterial insufficiency -continue sildenafil prn   No follow-ups on file.  Wilkie Aye, MD  [facility] [place]

## 2022-11-13 NOTE — Patient Instructions (Signed)

## 2022-11-26 ENCOUNTER — Other Ambulatory Visit (HOSPITAL_BASED_OUTPATIENT_CLINIC_OR_DEPARTMENT_OTHER): Payer: Self-pay

## 2022-11-26 ENCOUNTER — Other Ambulatory Visit: Payer: Self-pay | Admitting: Family Medicine

## 2022-11-26 MED ORDER — ATORVASTATIN CALCIUM 10 MG PO TABS
10.0000 mg | ORAL_TABLET | Freq: Every day | ORAL | 1 refills | Status: DC
  Filled 2022-11-26: qty 90, 90d supply, fill #0
  Filled 2023-03-04: qty 90, 90d supply, fill #1

## 2022-12-23 ENCOUNTER — Other Ambulatory Visit: Payer: Self-pay | Admitting: Family Medicine

## 2022-12-23 ENCOUNTER — Other Ambulatory Visit: Payer: Self-pay

## 2022-12-24 ENCOUNTER — Other Ambulatory Visit (HOSPITAL_BASED_OUTPATIENT_CLINIC_OR_DEPARTMENT_OTHER): Payer: Self-pay

## 2022-12-24 MED ORDER — LISINOPRIL 20 MG PO TABS
20.0000 mg | ORAL_TABLET | Freq: Every day | ORAL | 1 refills | Status: DC
  Filled 2022-12-24: qty 90, 90d supply, fill #0
  Filled 2023-04-01: qty 90, 90d supply, fill #1

## 2022-12-25 ENCOUNTER — Other Ambulatory Visit (HOSPITAL_BASED_OUTPATIENT_CLINIC_OR_DEPARTMENT_OTHER): Payer: Self-pay

## 2023-01-08 ENCOUNTER — Telehealth: Payer: Self-pay | Admitting: Family Medicine

## 2023-01-08 NOTE — Telephone Encounter (Signed)
Copied from CRM [id number]. Topic: Medicare AWV >> Jan 08, 2023 11:25 AM Payton Doughty wrote: Reason for CRM: LM 01/08/2023 to schedule AWV   Verlee Rossetti; Care Guide Ambulatory Clinical Support [facility] Direct Dial: [phone]

## 2023-01-09 ENCOUNTER — Other Ambulatory Visit: Payer: Self-pay

## 2023-01-09 ENCOUNTER — Other Ambulatory Visit (HOSPITAL_BASED_OUTPATIENT_CLINIC_OR_DEPARTMENT_OTHER): Payer: Self-pay

## 2023-01-09 ENCOUNTER — Other Ambulatory Visit: Payer: Self-pay | Admitting: Family Medicine

## 2023-01-09 DIAGNOSIS — G4700 Insomnia, unspecified: Secondary | ICD-10-CM

## 2023-01-09 MED ORDER — PROPRANOLOL HCL 40 MG PO TABS
40.0000 mg | ORAL_TABLET | Freq: Every day | ORAL | 1 refills | Status: DC
  Filled 2023-01-09: qty 90, 90d supply, fill #0
  Filled 2023-04-08: qty 90, 90d supply, fill #1

## 2023-01-09 MED ORDER — ZOLPIDEM TARTRATE 10 MG PO TABS
5.0000 mg | ORAL_TABLET | Freq: Every evening | ORAL | 5 refills | Status: DC | PRN
Start: 2023-01-09 — End: 2023-07-10
  Filled 2023-01-09: qty 30, 30d supply, fill #0
  Filled 2023-03-12: qty 30, 30d supply, fill #1
  Filled 2023-05-13: qty 30, 30d supply, fill #2

## 2023-01-09 NOTE — Telephone Encounter (Signed)
Requesting: Ambien 10mg   Contract:03/25/22 UDS: 03/25/22 Last Visit: 10/01/22 Next Visit: 04/03/23 Last Refill: 07/12/22 #30 and 3RF   Please Advise

## 2023-01-24 ENCOUNTER — Other Ambulatory Visit (HOSPITAL_BASED_OUTPATIENT_CLINIC_OR_DEPARTMENT_OTHER): Payer: Self-pay

## 2023-01-24 ENCOUNTER — Other Ambulatory Visit: Payer: Self-pay | Admitting: Family Medicine

## 2023-01-24 MED ORDER — OMEPRAZOLE 20 MG PO CPDR
20.0000 mg | DELAYED_RELEASE_CAPSULE | Freq: Every day | ORAL | 1 refills | Status: DC
  Filled 2023-01-24: qty 90, 90d supply, fill #0
  Filled 2023-04-28: qty 90, 90d supply, fill #1

## 2023-02-24 ENCOUNTER — Telehealth: Payer: Self-pay | Admitting: Family Medicine

## 2023-02-24 NOTE — Telephone Encounter (Signed)
Copied from CRM [id number]. Topic: Medicare AWV >> Feb 24, 2023 11:44 AM Payton Doughty wrote: Reason for CRM: LM 02/24/2023 to schedule AWV   Verlee Rossetti; Care Guide Ambulatory Clinical Support [facility] Direct Dial: [phone]

## 2023-03-12 ENCOUNTER — Other Ambulatory Visit: Payer: Self-pay | Admitting: Family Medicine

## 2023-03-13 ENCOUNTER — Encounter (INDEPENDENT_AMBULATORY_CARE_PROVIDER_SITE_OTHER): Payer: Self-pay

## 2023-03-13 ENCOUNTER — Other Ambulatory Visit: Payer: Self-pay

## 2023-03-13 ENCOUNTER — Other Ambulatory Visit (HOSPITAL_BASED_OUTPATIENT_CLINIC_OR_DEPARTMENT_OTHER): Payer: Self-pay

## 2023-03-13 MED ORDER — FLUTICASONE PROPIONATE 50 MCG/ACT NA SUSP
2.0000 | Freq: Every day | NASAL | 5 refills | Status: DC
  Filled 2023-03-13 – 2023-03-18 (×2): qty 16, 30d supply, fill #0

## 2023-03-18 ENCOUNTER — Other Ambulatory Visit (HOSPITAL_BASED_OUTPATIENT_CLINIC_OR_DEPARTMENT_OTHER): Payer: Self-pay

## 2023-03-18 ENCOUNTER — Other Ambulatory Visit: Payer: Self-pay

## 2023-03-21 ENCOUNTER — Other Ambulatory Visit (HOSPITAL_BASED_OUTPATIENT_CLINIC_OR_DEPARTMENT_OTHER): Payer: Self-pay

## 2023-03-21 ENCOUNTER — Other Ambulatory Visit: Payer: Self-pay | Admitting: Family Medicine

## 2023-03-21 MED ORDER — FLUTICASONE PROPIONATE 50 MCG/ACT NA SUSP
2.0000 | Freq: Every day | NASAL | 1 refills | Status: DC
  Filled 2023-03-21 – 2023-03-25 (×4): qty 48, 90d supply, fill #0
  Filled 2023-06-12: qty 48, 90d supply, fill #1

## 2023-03-24 ENCOUNTER — Other Ambulatory Visit (HOSPITAL_BASED_OUTPATIENT_CLINIC_OR_DEPARTMENT_OTHER): Payer: Self-pay

## 2023-03-25 ENCOUNTER — Other Ambulatory Visit (HOSPITAL_BASED_OUTPATIENT_CLINIC_OR_DEPARTMENT_OTHER): Payer: Self-pay

## 2023-04-02 NOTE — Assessment & Plan Note (Signed)
Tolerating statin, encouraged heart healthy diet, avoid trans fats, minimize simple carbs and saturated fats. Increase exercise as tolerated 

## 2023-04-02 NOTE — Assessment & Plan Note (Signed)
hgba1c acceptable, minimize simple carbs. Increase exercise as tolerated.  

## 2023-04-02 NOTE — Assessment & Plan Note (Signed)
Hydrate and monitor 

## 2023-04-02 NOTE — Assessment & Plan Note (Signed)
Supplement and monitor 

## 2023-04-02 NOTE — Assessment & Plan Note (Signed)
Well controlled, no changes to meds. Encouraged heart healthy diet such as the DASH diet and exercise as tolerated.  °

## 2023-04-03 ENCOUNTER — Other Ambulatory Visit (HOSPITAL_BASED_OUTPATIENT_CLINIC_OR_DEPARTMENT_OTHER): Payer: Self-pay

## 2023-04-03 ENCOUNTER — Ambulatory Visit (INDEPENDENT_AMBULATORY_CARE_PROVIDER_SITE_OTHER): Payer: Medicare PPO | Admitting: Family Medicine

## 2023-04-03 ENCOUNTER — Telehealth (HOSPITAL_BASED_OUTPATIENT_CLINIC_OR_DEPARTMENT_OTHER): Payer: Self-pay

## 2023-04-03 VITALS — BP 128/86 | HR 88 | Temp 97.6°F | Resp 16 | Ht 74.0 in | Wt 195.6 lb

## 2023-04-03 DIAGNOSIS — R739 Hyperglycemia, unspecified: Secondary | ICD-10-CM

## 2023-04-03 DIAGNOSIS — M1A9XX0 Chronic gout, unspecified, without tophus (tophi): Secondary | ICD-10-CM

## 2023-04-03 DIAGNOSIS — E349 Endocrine disorder, unspecified: Secondary | ICD-10-CM

## 2023-04-03 DIAGNOSIS — E782 Mixed hyperlipidemia: Secondary | ICD-10-CM

## 2023-04-03 DIAGNOSIS — I10 Essential (primary) hypertension: Secondary | ICD-10-CM

## 2023-04-03 DIAGNOSIS — N281 Cyst of kidney, acquired: Secondary | ICD-10-CM | POA: Diagnosis not present

## 2023-04-03 DIAGNOSIS — J329 Chronic sinusitis, unspecified: Secondary | ICD-10-CM | POA: Diagnosis not present

## 2023-04-03 DIAGNOSIS — E559 Vitamin D deficiency, unspecified: Secondary | ICD-10-CM

## 2023-04-03 LAB — CBC WITH DIFFERENTIAL/PLATELET
Basophils Absolute: 0.1 10*3/uL (ref 0.0–0.1)
Basophils Relative: 0.8 % (ref 0.0–3.0)
Eosinophils Absolute: 0.2 10*3/uL (ref 0.0–0.7)
Eosinophils Relative: 2.6 % (ref 0.0–5.0)
HCT: 50.7 % (ref 39.0–52.0)
Hemoglobin: 16.9 g/dL (ref 13.0–17.0)
Lymphocytes Relative: 33.1 % (ref 12.0–46.0)
Lymphs Abs: 2.6 10*3/uL (ref 0.7–4.0)
MCHC: 33.3 g/dL (ref 30.0–36.0)
MCV: 87.2 fl (ref 78.0–100.0)
Monocytes Absolute: 0.8 10*3/uL (ref 0.1–1.0)
Monocytes Relative: 9.9 % (ref 3.0–12.0)
Neutro Abs: 4.2 10*3/uL (ref 1.4–7.7)
Neutrophils Relative %: 53.6 % (ref 43.0–77.0)
Platelets: 229 10*3/uL (ref 150.0–400.0)
RBC: 5.81 Mil/uL (ref 4.22–5.81)
RDW: 15.2 % (ref 11.5–15.5)
WBC: 7.9 10*3/uL (ref 4.0–10.5)

## 2023-04-03 LAB — LIPID PANEL
Cholesterol: 142 mg/dL (ref 0–200)
HDL: 55.7 mg/dL (ref >39.00)
LDL Cholesterol: 61 mg/dL (ref 0–99)
NonHDL: 86.23
Total CHOL/HDL Ratio: 3
Triglycerides: 127 mg/dL (ref 0.0–149.0)
VLDL: 25.4 mg/dL (ref 0.0–40.0)

## 2023-04-03 LAB — COMPREHENSIVE METABOLIC PANEL
ALT: 31 U/L (ref 0–53)
AST: 27 U/L (ref 0–37)
Albumin: 4.4 g/dL (ref 3.5–5.2)
Alkaline Phosphatase: 54 U/L (ref 39–117)
BUN: 14 mg/dL (ref 6–23)
CO2: 30 meq/L (ref 19–32)
Calcium: 9.8 mg/dL (ref 8.4–10.5)
Chloride: 99 meq/L (ref 96–112)
Creatinine, Ser: 1.15 mg/dL (ref 0.40–1.50)
GFR: 65.33 mL/min (ref >60.00)
Glucose, Bld: 92 mg/dL (ref 70–99)
Potassium: 4.7 meq/L (ref 3.5–5.1)
Sodium: 136 meq/L (ref 135–145)
Total Bilirubin: 1.2 mg/dL (ref 0.2–1.2)
Total Protein: 6.9 g/dL (ref 6.0–8.3)

## 2023-04-03 LAB — VITAMIN D 25 HYDROXY (VIT D DEFICIENCY, FRACTURES): VITD: 84.53 ng/mL (ref 30.00–100.00)

## 2023-04-03 LAB — HEMOGLOBIN A1C: Hgb A1c MFr Bld: 5.6 % (ref 4.6–6.5)

## 2023-04-03 LAB — URIC ACID: Uric Acid, Serum: 6.7 mg/dL (ref 4.0–7.8)

## 2023-04-03 LAB — TSH: TSH: 0.5 u[IU]/mL (ref 0.35–5.50)

## 2023-04-03 MED ORDER — CEFDINIR 300 MG PO CAPS
300.0000 mg | ORAL_CAPSULE | Freq: Two times a day (BID) | ORAL | 0 refills | Status: AC
  Filled 2023-04-03: qty 20, 10d supply, fill #0

## 2023-04-03 NOTE — Patient Instructions (Addendum)
[employer] on [employer]   Hypertension, Adult High blood pressure (hypertension) is when the force of blood pumping through the arteries is too strong. The arteries are the blood vessels that carry blood from the heart throughout the body. Hypertension forces the heart to work harder to pump blood and may cause arteries to become narrow or stiff. Untreated or uncontrolled hypertension can lead to a heart attack, heart failure, a stroke, kidney disease, and other problems. A blood pressure reading consists of a higher number over a lower number. Ideally, your blood pressure should be below 120/80. The first ("top") number is called the systolic pressure. It is a measure of the pressure in your arteries as your heart beats. The second ("bottom") number is called the diastolic pressure. It is a measure of the pressure in your arteries as the heart relaxes. What are the causes? The exact cause of this condition is not known. There are some conditions that result in high blood pressure. What increases the risk? Certain factors may make you more likely to develop high blood pressure. Some of these risk factors are under your control, including: Smoking. Not getting enough exercise or physical activity. Being overweight. Having too much fat, sugar, calories, or salt (sodium) in your diet. Drinking too much alcohol. Other risk factors include: Having a personal history of heart disease, diabetes, high cholesterol, or kidney disease. Stress. Having a family history of high blood pressure and high cholesterol. Having obstructive sleep apnea. Age. The risk increases with age. What are the signs or symptoms? High blood pressure may not cause symptoms. Very high blood pressure (hypertensive crisis) may cause: Headache. Fast or irregular heartbeats (palpitations). Shortness of breath. Nosebleed. Nausea and vomiting. Vision changes. Severe chest pain, dizziness, and seizures. How is this  diagnosed? This condition is diagnosed by measuring your blood pressure while you are seated, with your arm resting on a flat surface, your legs uncrossed, and your feet flat on the floor. The cuff of the blood pressure monitor will be placed directly against the skin of your upper arm at the level of your heart. Blood pressure should be measured at least twice using the same arm. Certain conditions can cause a difference in blood pressure between your right and left arms. If you have a high blood pressure reading during one visit or you have normal blood pressure with other risk factors, you may be asked to: Return on a different day to have your blood pressure checked again. Monitor your blood pressure at home for 1 week or longer. If you are diagnosed with hypertension, you may have other blood or imaging tests to help your health care provider understand your overall risk for other conditions. How is this treated? This condition is treated by making healthy lifestyle changes, such as eating healthy foods, exercising more, and reducing your alcohol intake. You may be referred for counseling on a healthy diet and physical activity. Your health care provider may prescribe medicine if lifestyle changes are not enough to get your blood pressure under control and if: Your systolic blood pressure is above 130. Your diastolic blood pressure is above 80. Your personal target blood pressure may vary depending on your medical conditions, your age, and other factors. Follow these instructions at home: Eating and drinking  Eat a diet that is high in fiber and potassium, and low in sodium, added sugar, and fat. An example of this eating plan is called the DASH diet. DASH stands for Dietary Approaches to  Stop Hypertension. To eat this way: Eat plenty of fresh fruits and vegetables. Try to fill one half of your plate at each meal with fruits and vegetables. Eat whole grains, such as whole-wheat pasta, brown  rice, or whole-grain bread. Fill about one fourth of your plate with whole grains. Eat or drink low-fat dairy products, such as skim milk or low-fat yogurt. Avoid fatty cuts of meat, processed or cured meats, and poultry with skin. Fill about one fourth of your plate with lean proteins, such as fish, chicken without skin, beans, eggs, or tofu. Avoid pre-made and processed foods. These tend to be higher in sodium, added sugar, and fat. Reduce your daily sodium intake. Many people with hypertension should eat less than 1,500 mg of sodium a day. Do not drink alcohol if: Your health care provider tells you not to drink. You are pregnant, may be pregnant, or are planning to become pregnant. If you drink alcohol: Limit how much you have to: 0-1 drink a day for women. 0-2 drinks a day for men. Know how much alcohol is in your drink. In the [place] one drink equals one 12 oz bottle of beer (355 mL), one 5 oz glass of wine (148 mL), or one 1 oz glass of hard liquor (44 mL). Lifestyle  Work with your health care provider to maintain a healthy body weight or to lose weight. Ask what an ideal weight is for you. Get at least 30 minutes of exercise that causes your heart to beat faster (aerobic exercise) most days of the week. Activities may include walking, swimming, or biking. Include exercise to strengthen your muscles (resistance exercise), such as Pilates or lifting weights, as part of your weekly exercise routine. Try to do these types of exercises for 30 minutes at least 3 days a week. Do not use any products that contain nicotine or tobacco. These products include cigarettes, chewing tobacco, and vaping devices, such as e-cigarettes. If you need help quitting, ask your health care provider. Monitor your blood pressure at home as told by your health care provider. Keep all follow-up visits. This is important. Medicines Take over-the-counter and prescription medicines only as told by your health care  provider. Follow directions carefully. Blood pressure medicines must be taken as prescribed. Do not skip doses of blood pressure medicine. Doing this puts you at risk for problems and can make the medicine less effective. Ask your health care provider about side effects or reactions to medicines that you should watch for. Contact a health care provider if you: Think you are having a reaction to a medicine you are taking. Have headaches that keep coming back (recurring). Feel dizzy. Have swelling in your ankles. Have trouble with your vision. Get help right away if you: Develop a severe headache or confusion. Have unusual weakness or numbness. Feel faint. Have severe pain in your chest or abdomen. Vomit repeatedly. Have trouble breathing. These symptoms may be an emergency. Get help right away. Call 911. Do not wait to see if the symptoms will go away. Do not drive yourself to the hospital. Summary Hypertension is when the force of blood pumping through your arteries is too strong. If this condition is not controlled, it may put you at risk for serious complications. Your personal target blood pressure may vary depending on your medical conditions, your age, and other factors. For most people, a normal blood pressure is less than 120/80. Hypertension is treated with lifestyle changes, medicines, or a combination of both. Lifestyle  changes include losing weight, eating a healthy, low-sodium diet, exercising more, and limiting alcohol. This information is not intended to replace advice given to you by your health care provider. Make sure you discuss any questions you have with your health care provider. Document Revised: 05/22/2021 Document Reviewed: 05/22/2021 Elsevier Patient Education  2024 [employer].

## 2023-04-07 NOTE — Progress Notes (Signed)
Subjective:    Patient ID: Taylor Mcdonald, male    DOB: [birth year], 67 y.o.   MRN: [id number]  Chief Complaint  Patient presents with   Follow-up    Follow up    HPI Discussed the use of AI scribe software for clinical note transcription with the patient, who gave verbal consent to proceed.  History of Present Illness   The patient, with a past medical history of COVID-19, fatty liver, and kidney cysts, presents for a routine check-up. They report feeling well overall and maintain an active lifestyle by going to the gym three to four times a week. They mention a previous diagnosis of a fatty liver and kidney cysts, which were identified on an ultrasound two to three years ago. The patient expresses no current concerns or new symptoms.     He does note mild nasal congestion and follow up on chronic medical concerns. No recent febrile illness or hospitalizations. Denies CP/palp/SOB/HA/congestion/fevers/GI or GU c/o. Taking meds as prescribed    Past Medical History:  Diagnosis Date   Allergy    Anxiety 03/04/2012   Fatigue    GERD (gastroesophageal reflux disease)    Gout    Hyperglycemia    Hyperlipidemia    Hypertension    Insomnia    Internal nasal lesion 07/04/2016   Low testosterone    Lower back pain 09/09/2012   Migraine    Pain of left heel 10/01/2011   Preventative health care 07/24/2012   RLS (restless legs syndrome)    Sinusitis 03/04/2012   Varicose veins of both lower extremities 12/12/2016   Vasovagal near syncope 04/30/2013   Vitamin D deficiency 12/23/2011    Past Surgical History:  Procedure Laterality Date   LUMBAR LAMINECTOMY/DECOMPRESSION MICRODISCECTOMY N/A 05/13/2013   Procedure: LUMBAR LAMINECTOMY/DECOMPRESSION MICRODISCECTOMY 1 LEVEL L3-4;  Surgeon: Javier Docker, MD;  Location: [facility];  Service: Orthopedics;  Laterality: N/A;   PANENDOSCOPY     TONSILLECTOMY      Family History  Problem Relation Age of Onset   Obesity Mother    Hypertension Son     Hypertension Maternal Grandmother    Obesity Maternal Grandmother    Cancer Maternal Grandmother 65       intestinal   Dementia Maternal Grandfather    Colon cancer Neg Hx    Esophageal cancer Neg Hx    Rectal cancer Neg Hx    Stomach cancer Neg Hx     Social History   Socioeconomic History   Marital status: Married    Spouse name: Not on file   Number of children: Not on file   Years of education: Not on file   Highest education level: Not on file  Occupational History   Not on file  Tobacco Use   Smoking status: Never   Smokeless tobacco: Never  Vaping Use   Vaping status: Never Used  Substance and Sexual Activity   Alcohol use: No   Drug use: Not Currently    Types: Hydrocodone    Comment: Pt states he does not use anymore.   Sexual activity: Yes  Other Topics Concern   Not on file  Social History Narrative   Not on file   Social Determinants of Health   Financial Resource Strain: Low Risk  (08/03/2021)   Overall Financial Resource Strain (CARDIA)    Difficulty of Paying Living Expenses: Not hard at all  Food Insecurity: No Food Insecurity (08/03/2021)   Hunger Vital Sign    Worried  About Running Out of Food in the Last Year: Never true    Ran Out of Food in the Last Year: Never true  Transportation Needs: No Transportation Needs (08/03/2021)   PRAPARE - Administrator, Civil Service (Medical): No    Lack of Transportation (Non-Medical): No  Physical Activity: Insufficiently Active (08/03/2021)   Exercise Vital Sign    Days of Exercise per Week: 4 days    Minutes of Exercise per Session: 30 min  Stress: No Stress Concern Present (08/03/2021)   [employer] of Occupational Health - Occupational Stress Questionnaire    Feeling of Stress : Not at all  Social Connections: Moderately Integrated (08/03/2021)   Social Connection and Isolation Panel [NHANES]    Frequency of Communication with Friends and Family: More than three times a week    Frequency  of Social Gatherings with Friends and Family: More than three times a week    Attends Religious Services: More than 4 times per year    Active Member of [employer] or Organizations: No    Attends Banker Meetings: Never    Marital Status: Married  Catering manager Violence: Not At Risk (08/03/2021)   Humiliation, Afraid, Rape, and Kick questionnaire    Fear of Current or Ex-Partner: No    Emotionally Abused: No    Physically Abused: No    Sexually Abused: No    Outpatient Medications Prior to Visit  Medication Sig Dispense Refill   allopurinol (ZYLOPRIM) 100 MG tablet Take 2 tablets (200 mg total) by mouth daily. 180 tablet 3   anastrozole (ARIMIDEX) 1 MG tablet Take 1/2 tablet (0.5 mg total) by mouth daily. 45 tablet 3   aspirin 81 MG tablet Take 1 tablet (81 mg total) by mouth daily. Resume 4 days post-op 30 tablet    atorvastatin (LIPITOR) 10 MG tablet Take 1 tablet (10 mg total) by mouth daily. 90 tablet 1   Fish Oil-Cholecalciferol (FISH OIL + D3) 1200-1000 MG-UNIT CAPS Take 1 capsule by mouth daily.     fluticasone (FLONASE) 50 MCG/ACT nasal spray Place 2 sprays into both nostrils daily. 48 g 1   lisinopril (ZESTRIL) 20 MG tablet Take 1 tablet (20 mg total) by mouth daily. 90 tablet 1   omeprazole (PRILOSEC) 20 MG capsule Take 1 capsule (20 mg total) by mouth daily. 90 capsule 1   OVER THE COUNTER MEDICATION 12 hr decongestant 120mg      propranolol (INDERAL) 40 MG tablet Take 1 tablet (40 mg total) by mouth daily. 90 tablet 1   sildenafil (REVATIO) 20 MG tablet Take 1 tablet (20 mg total) by mouth as needed. 90 tablet 3   testosterone cypionate (DEPOTESTOSTERONE CYPIONATE) 200 MG/ML injection Inject 1 mL (200 mg total) into the skin every 14 (fourteen) days. 10 mL 3   zolpidem (AMBIEN) 10 MG tablet Take 0.5-1 tablets (5-10 mg total) by mouth at bedtime as needed. 30 tablet 5   No facility-administered medications prior to visit.    No Known Allergies  Review of Systems   Constitutional:  Negative for fever and malaise/fatigue.  HENT:  Negative for congestion.   Eyes:  Negative for blurred vision.  Respiratory:  Negative for shortness of breath.   Cardiovascular:  Negative for chest pain, palpitations and leg swelling.  Gastrointestinal:  Negative for abdominal pain, blood in stool and nausea.  Genitourinary:  Negative for dysuria and frequency.  Musculoskeletal:  Negative for falls.  Skin:  Negative for rash.  Neurological:  Negative for dizziness, loss of consciousness and headaches.  Endo/Heme/Allergies:  Negative for environmental allergies.  Psychiatric/Behavioral:  Negative for depression. The patient is not nervous/anxious.        Objective:    Physical Exam Vitals reviewed.  Constitutional:      Appearance: Normal appearance. He is not ill-appearing.  HENT:     Head: Normocephalic and atraumatic.     Nose: Nose normal.  Eyes:     Conjunctiva/sclera: Conjunctivae normal.  Cardiovascular:     Rate and Rhythm: Normal rate.     Pulses: Normal pulses.     Heart sounds: Normal heart sounds. No murmur heard. Pulmonary:     Effort: Pulmonary effort is normal.     Breath sounds: Normal breath sounds. No wheezing.  Abdominal:     Palpations: Abdomen is soft. There is no mass.     Tenderness: There is no abdominal tenderness.  Musculoskeletal:     Cervical back: Normal range of motion.     Right lower leg: No edema.     Left lower leg: No edema.  Skin:    General: Skin is warm and dry.  Neurological:     General: No focal deficit present.     Mental Status: He is alert and oriented to person, place, and time.  Psychiatric:        Mood and Affect: Mood normal.     BP 128/86 (BP Location: Left Arm, Patient Position: Sitting, Cuff Size: Normal)   Pulse 88   Temp 97.6 F (36.4 C) (Oral)   Resp 16   Ht 6\' 2"  (1.88 m)   Wt 195 lb 9.6 oz (88.7 kg)   SpO2 98%   BMI 25.11 kg/m  Wt Readings from Last 3 Encounters:  04/03/23 195 lb 9.6  oz (88.7 kg)  10/01/22 195 lb 12.8 oz (88.8 kg)  03/25/22 197 lb 3.2 oz (89.4 kg)    Diabetic Foot Exam - Simple   No data filed    Lab Results  Component Value Date   WBC 7.9 04/03/2023   HGB 16.9 04/03/2023   HCT 50.7 04/03/2023   PLT 229.0 04/03/2023   GLUCOSE 92 04/03/2023   CHOL 142 04/03/2023   TRIG 127.0 04/03/2023   HDL 55.70 04/03/2023   LDLDIRECT 125.7 02/10/2013   LDLCALC 61 04/03/2023   ALT 31 04/03/2023   AST 27 04/03/2023   NA 136 04/03/2023   K 4.7 04/03/2023   CL 99 04/03/2023   CREATININE 1.15 04/03/2023   BUN 14 04/03/2023   CO2 30 04/03/2023   TSH 0.50 04/03/2023   PSA 0.64 12/25/2017   HGBA1C 5.6 04/03/2023   MICROALBUR <0.7 12/25/2017    Lab Results  Component Value Date   TSH 0.50 04/03/2023   Lab Results  Component Value Date   WBC 7.9 04/03/2023   HGB 16.9 04/03/2023   HCT 50.7 04/03/2023   MCV 87.2 04/03/2023   PLT 229.0 04/03/2023   Lab Results  Component Value Date   NA 136 04/03/2023   K 4.7 04/03/2023   CO2 30 04/03/2023   GLUCOSE 92 04/03/2023   BUN 14 04/03/2023   CREATININE 1.15 04/03/2023   BILITOT 1.2 04/03/2023   ALKPHOS 54 04/03/2023   AST 27 04/03/2023   ALT 31 04/03/2023   PROT 6.9 04/03/2023   ALBUMIN 4.4 04/03/2023   CALCIUM 9.8 04/03/2023   EGFR 64 05/08/2022   GFR 65.33 04/03/2023   Lab Results  Component Value Date   CHOL  142 04/03/2023   Lab Results  Component Value Date   HDL 55.70 04/03/2023   Lab Results  Component Value Date   LDLCALC 61 04/03/2023   Lab Results  Component Value Date   TRIG 127.0 04/03/2023   Lab Results  Component Value Date   CHOLHDL 3 04/03/2023   Lab Results  Component Value Date   HGBA1C 5.6 04/03/2023       Assessment & Plan:  Chronic gout without tophus, unspecified cause, unspecified site Assessment & Plan: Hydrate and monitor   Orders: -     Uric acid  Hyperglycemia Assessment & Plan: hgba1c acceptable, minimize simple carbs. Increase exercise  as tolerated.   Orders: -     Hemoglobin A1c  Mixed hyperlipidemia Assessment & Plan: Tolerating statin, encouraged heart healthy diet, avoid trans fats, minimize simple carbs and saturated fats. Increase exercise as tolerated   Orders: -     Lipid panel  Primary hypertension Assessment & Plan: Well controlled, no changes to meds. Encouraged heart healthy diet such as the DASH diet and exercise as tolerated.    Orders: -     Comprehensive metabolic panel -     CBC with Differential/Platelet -     TSH  Vitamin D deficiency Assessment & Plan: Supplement and monitor   Orders: -     VITAMIN D 25 Hydroxy (Vit-D Deficiency, Fractures)  Testosterone deficiency Assessment & Plan: Supplement and monitor    Renal cyst -     US Abdomen Complete; Future  Other orders -     Cefdinir; Take 1 capsule (300 mg total) by mouth 2 (two) times daily for 10 days.  Dispense: 20 capsule; Refill: 0    Assessment and Plan    Possible COVID-19 Patient presents with symptoms that could be indicative of COVID-19 or another respiratory illness. Discussed the importance of testing to rule out COVID-19 due to its variable presentation and potential for transmission. -Perform nasal swab COVID-19 test today.  Fatty Liver and Kidney Cysts Patient has a history of fatty liver and kidney cysts. The cysts were simple and not complex, and the fatty liver is likely due to diet and lifestyle factors. No changes in symptoms or health status related to these conditions. -Repeat ultrasound to monitor for changes in kidney cysts.  Testosterone Management Patient is currently under the care of Dr. Ronne Binning for testosterone management and is on a yearly follow-up schedule. No changes in testosterone therapy or related symptoms. -No changes to current management plan.  General Health Maintenance Patient is maintaining a regular exercise routine and is mindful of diet. Discussed the importance of continued  healthy lifestyle habits for overall health and management of existing conditions. -Continue current exercise and dietary habits. -Follow-up in six months (March 2025) or sooner if any changes or concerns arise.         Danise Edge, MD

## 2023-04-07 NOTE — Assessment & Plan Note (Signed)
Given an rx for Cefdinir to use if symptoms worsen.

## 2023-04-07 NOTE — Assessment & Plan Note (Signed)
And hepatic findings. Will repeat Ultrasound to assess for stability

## 2023-04-08 ENCOUNTER — Ambulatory Visit (HOSPITAL_BASED_OUTPATIENT_CLINIC_OR_DEPARTMENT_OTHER)
Admission: RE | Admit: 2023-04-08 | Discharge: 2023-04-08 | Disposition: A | Discharge status: Home / Self Care | Payer: Medicare PPO | Source: Ambulatory Visit | Attending: Family Medicine | Admitting: Family Medicine

## 2023-04-08 DIAGNOSIS — N281 Cyst of kidney, acquired: Secondary | ICD-10-CM | POA: Insufficient documentation

## 2023-04-08 DIAGNOSIS — K760 Fatty (change of) liver, not elsewhere classified: Secondary | ICD-10-CM | POA: Diagnosis not present

## 2023-06-03 ENCOUNTER — Other Ambulatory Visit: Payer: Self-pay | Admitting: Family Medicine

## 2023-06-03 ENCOUNTER — Other Ambulatory Visit: Payer: Self-pay | Admitting: Urology

## 2023-06-04 ENCOUNTER — Other Ambulatory Visit (HOSPITAL_BASED_OUTPATIENT_CLINIC_OR_DEPARTMENT_OTHER): Payer: Self-pay

## 2023-06-04 ENCOUNTER — Other Ambulatory Visit: Payer: Self-pay

## 2023-06-04 MED ORDER — ATORVASTATIN CALCIUM 10 MG PO TABS
10.0000 mg | ORAL_TABLET | Freq: Every day | ORAL | 1 refills | Status: DC
  Filled 2023-06-04 – 2023-09-08 (×2): qty 90, 90d supply, fill #0

## 2023-06-10 ENCOUNTER — Other Ambulatory Visit (HOSPITAL_BASED_OUTPATIENT_CLINIC_OR_DEPARTMENT_OTHER): Payer: Self-pay

## 2023-06-10 MED ORDER — TESTOSTERONE CYPIONATE 200 MG/ML IM SOLN
200.0000 mg | INTRAMUSCULAR | 3 refills | Status: DC
Start: 2023-06-10 — End: 2024-02-13
  Filled 2023-06-10 – 2023-09-05 (×3): qty 6, 84d supply, fill #0
  Filled 2023-11-23 – 2023-11-29 (×3): qty 6, 84d supply, fill #1

## 2023-06-18 ENCOUNTER — Other Ambulatory Visit (HOSPITAL_BASED_OUTPATIENT_CLINIC_OR_DEPARTMENT_OTHER): Payer: Self-pay

## 2023-07-02 DIAGNOSIS — L821 Other seborrheic keratosis: Secondary | ICD-10-CM | POA: Diagnosis not present

## 2023-07-02 DIAGNOSIS — D1801 Hemangioma of skin and subcutaneous tissue: Secondary | ICD-10-CM | POA: Diagnosis not present

## 2023-07-02 DIAGNOSIS — D225 Melanocytic nevi of trunk: Secondary | ICD-10-CM | POA: Diagnosis not present

## 2023-07-02 DIAGNOSIS — I8392 Asymptomatic varicose veins of left lower extremity: Secondary | ICD-10-CM | POA: Diagnosis not present

## 2023-07-02 DIAGNOSIS — D2262 Melanocytic nevi of left upper limb, including shoulder: Secondary | ICD-10-CM | POA: Diagnosis not present

## 2023-07-02 DIAGNOSIS — L570 Actinic keratosis: Secondary | ICD-10-CM | POA: Diagnosis not present

## 2023-07-02 DIAGNOSIS — L814 Other melanin hyperpigmentation: Secondary | ICD-10-CM | POA: Diagnosis not present

## 2023-07-02 DIAGNOSIS — I8391 Asymptomatic varicose veins of right lower extremity: Secondary | ICD-10-CM | POA: Diagnosis not present

## 2023-07-02 DIAGNOSIS — Z85828 Personal history of other malignant neoplasm of skin: Secondary | ICD-10-CM | POA: Diagnosis not present

## 2023-07-05 ENCOUNTER — Other Ambulatory Visit: Payer: Self-pay | Admitting: Family Medicine

## 2023-07-07 ENCOUNTER — Other Ambulatory Visit (HOSPITAL_BASED_OUTPATIENT_CLINIC_OR_DEPARTMENT_OTHER): Payer: Self-pay

## 2023-07-07 MED ORDER — LISINOPRIL 20 MG PO TABS
20.0000 mg | ORAL_TABLET | Freq: Every day | ORAL | 1 refills | Status: DC
  Filled 2023-07-07: qty 90, 90d supply, fill #0
  Filled 2023-10-01: qty 90, 90d supply, fill #1

## 2023-07-10 ENCOUNTER — Other Ambulatory Visit: Payer: Self-pay | Admitting: Family Medicine

## 2023-07-10 DIAGNOSIS — G4700 Insomnia, unspecified: Secondary | ICD-10-CM

## 2023-07-11 ENCOUNTER — Other Ambulatory Visit (HOSPITAL_BASED_OUTPATIENT_CLINIC_OR_DEPARTMENT_OTHER): Payer: Self-pay

## 2023-07-11 MED ORDER — ZOLPIDEM TARTRATE 10 MG PO TABS
5.0000 mg | ORAL_TABLET | Freq: Every evening | ORAL | 5 refills | Status: DC | PRN
Start: 2023-07-11 — End: 2024-03-11
  Filled 2023-07-11: qty 30, 30d supply, fill #0
  Filled 2023-09-09: qty 30, 30d supply, fill #1
  Filled 2023-11-07: qty 30, 30d supply, fill #2
  Filled 2024-01-05: qty 30, 30d supply, fill #3

## 2023-07-20 ENCOUNTER — Other Ambulatory Visit: Payer: Self-pay | Admitting: Family Medicine

## 2023-07-21 ENCOUNTER — Other Ambulatory Visit (HOSPITAL_BASED_OUTPATIENT_CLINIC_OR_DEPARTMENT_OTHER): Payer: Self-pay

## 2023-07-21 MED ORDER — PROPRANOLOL HCL 40 MG PO TABS
40.0000 mg | ORAL_TABLET | Freq: Every day | ORAL | 1 refills | Status: DC
  Filled 2023-07-21: qty 90, 90d supply, fill #0
  Filled 2023-10-26: qty 90, 90d supply, fill #1

## 2023-07-29 ENCOUNTER — Other Ambulatory Visit (HOSPITAL_BASED_OUTPATIENT_CLINIC_OR_DEPARTMENT_OTHER): Payer: Self-pay

## 2023-07-29 ENCOUNTER — Other Ambulatory Visit: Payer: Self-pay | Admitting: Family Medicine

## 2023-07-29 MED ORDER — OMEPRAZOLE 20 MG PO CPDR
20.0000 mg | DELAYED_RELEASE_CAPSULE | Freq: Every day | ORAL | 1 refills | Status: DC
  Filled 2023-07-29: qty 90, 90d supply, fill #0
  Filled 2023-10-28: qty 90, 90d supply, fill #1

## 2023-08-25 ENCOUNTER — Other Ambulatory Visit: Payer: Self-pay | Admitting: Family Medicine

## 2023-08-25 ENCOUNTER — Other Ambulatory Visit (HOSPITAL_BASED_OUTPATIENT_CLINIC_OR_DEPARTMENT_OTHER): Payer: Self-pay

## 2023-08-25 MED ORDER — FLUTICASONE PROPIONATE 50 MCG/ACT NA SUSP
2.0000 | Freq: Every day | NASAL | 1 refills | Status: DC
  Filled 2023-08-25 – 2023-08-29 (×3): qty 48, 90d supply, fill #0
  Filled 2023-11-10 – 2023-11-12 (×2): qty 48, 90d supply, fill #1

## 2023-08-26 ENCOUNTER — Other Ambulatory Visit: Payer: Self-pay

## 2023-08-26 ENCOUNTER — Other Ambulatory Visit (HOSPITAL_BASED_OUTPATIENT_CLINIC_OR_DEPARTMENT_OTHER): Payer: Self-pay

## 2023-08-29 ENCOUNTER — Other Ambulatory Visit (HOSPITAL_BASED_OUTPATIENT_CLINIC_OR_DEPARTMENT_OTHER): Payer: Self-pay

## 2023-08-31 ENCOUNTER — Other Ambulatory Visit (HOSPITAL_BASED_OUTPATIENT_CLINIC_OR_DEPARTMENT_OTHER): Payer: Self-pay

## 2023-09-01 ENCOUNTER — Other Ambulatory Visit (HOSPITAL_BASED_OUTPATIENT_CLINIC_OR_DEPARTMENT_OTHER): Payer: Self-pay

## 2023-09-03 ENCOUNTER — Other Ambulatory Visit (HOSPITAL_BASED_OUTPATIENT_CLINIC_OR_DEPARTMENT_OTHER): Payer: Self-pay

## 2023-09-04 ENCOUNTER — Other Ambulatory Visit (HOSPITAL_BASED_OUTPATIENT_CLINIC_OR_DEPARTMENT_OTHER): Payer: Self-pay

## 2023-09-05 ENCOUNTER — Other Ambulatory Visit (HOSPITAL_BASED_OUTPATIENT_CLINIC_OR_DEPARTMENT_OTHER): Payer: Self-pay

## 2023-09-06 ENCOUNTER — Other Ambulatory Visit (HOSPITAL_BASED_OUTPATIENT_CLINIC_OR_DEPARTMENT_OTHER): Payer: Self-pay

## 2023-09-08 ENCOUNTER — Telehealth: Payer: Self-pay | Admitting: Urology

## 2023-09-08 NOTE — Telephone Encounter (Signed)
 Calling from [facility], patient is out of testosterone . The claim is being denied due to no diagnosis code. Call [phone]

## 2023-09-08 NOTE — Telephone Encounter (Signed)
 Insurance called to start a PA. Reference number [id number] insurance stated will be back within 72 hours with a answer

## 2023-09-09 ENCOUNTER — Other Ambulatory Visit (HOSPITAL_BASED_OUTPATIENT_CLINIC_OR_DEPARTMENT_OTHER): Payer: Self-pay

## 2023-09-10 ENCOUNTER — Other Ambulatory Visit (HOSPITAL_BASED_OUTPATIENT_CLINIC_OR_DEPARTMENT_OTHER): Payer: Self-pay

## 2023-09-10 ENCOUNTER — Other Ambulatory Visit: Payer: Self-pay

## 2023-09-28 NOTE — Assessment & Plan Note (Signed)
 hgba1c acceptable, minimize simple carbs. Increase exercise as tolerated.

## 2023-09-28 NOTE — Assessment & Plan Note (Signed)
 Hydrate and monitor

## 2023-09-28 NOTE — Assessment & Plan Note (Signed)
Encouraged good sleep hygiene such as dark, quiet room. No blue/green glowing lights such as computer screens in bedroom. No alcohol or stimulants in evening. Cut down on caffeine as able. Regular exercise is helpful but not just prior to bed time. May continue Ambien prn 

## 2023-09-28 NOTE — Assessment & Plan Note (Signed)
 Following with Urology tolerating Testosterone.

## 2023-09-28 NOTE — Assessment & Plan Note (Signed)
 Well controlled, no changes to meds. Encouraged heart healthy diet such as the DASH diet and exercise as tolerated.

## 2023-09-28 NOTE — Assessment & Plan Note (Signed)
 Tolerating statin, encouraged heart healthy diet, avoid trans fats, minimize simple carbs and saturated fats. Increase exercise as tolerated

## 2023-10-01 ENCOUNTER — Other Ambulatory Visit: Payer: Self-pay | Admitting: Urology

## 2023-10-02 ENCOUNTER — Encounter: Payer: Self-pay | Admitting: Family Medicine

## 2023-10-02 ENCOUNTER — Ambulatory Visit: Payer: Medicare PPO | Admitting: Family Medicine

## 2023-10-02 VITALS — BP 130/86 | HR 72 | Temp 97.6°F | Resp 16 | Ht 74.0 in | Wt 199.0 lb

## 2023-10-02 DIAGNOSIS — R739 Hyperglycemia, unspecified: Secondary | ICD-10-CM | POA: Diagnosis not present

## 2023-10-02 DIAGNOSIS — M1A9XX0 Chronic gout, unspecified, without tophus (tophi): Secondary | ICD-10-CM | POA: Diagnosis not present

## 2023-10-02 DIAGNOSIS — E782 Mixed hyperlipidemia: Secondary | ICD-10-CM | POA: Diagnosis not present

## 2023-10-02 DIAGNOSIS — E559 Vitamin D deficiency, unspecified: Secondary | ICD-10-CM

## 2023-10-02 DIAGNOSIS — E291 Testicular hypofunction: Secondary | ICD-10-CM | POA: Diagnosis not present

## 2023-10-02 DIAGNOSIS — I10 Essential (primary) hypertension: Secondary | ICD-10-CM

## 2023-10-02 DIAGNOSIS — G4700 Insomnia, unspecified: Secondary | ICD-10-CM | POA: Diagnosis not present

## 2023-10-02 LAB — CBC WITH DIFFERENTIAL/PLATELET
Basophils Absolute: 0.1 10*3/uL (ref 0.0–0.1)
Basophils Relative: 0.8 % (ref 0.0–3.0)
Eosinophils Absolute: 0.2 10*3/uL (ref 0.0–0.7)
Eosinophils Relative: 2.8 % (ref 0.0–5.0)
HCT: 50.4 % (ref 39.0–52.0)
Hemoglobin: 16.7 g/dL (ref 13.0–17.0)
Lymphocytes Relative: 30.2 % (ref 12.0–46.0)
Lymphs Abs: 2.5 10*3/uL (ref 0.7–4.0)
MCHC: 33.2 g/dL (ref 30.0–36.0)
MCV: 87.4 fl (ref 78.0–100.0)
Monocytes Absolute: 0.7 10*3/uL (ref 0.1–1.0)
Monocytes Relative: 9.1 % (ref 3.0–12.0)
Neutro Abs: 4.7 10*3/uL (ref 1.4–7.7)
Neutrophils Relative %: 57.1 % (ref 43.0–77.0)
Platelets: 248 10*3/uL (ref 150.0–400.0)
RBC: 5.76 Mil/uL (ref 4.22–5.81)
RDW: 15 % (ref 11.5–15.5)
WBC: 8.2 10*3/uL (ref 4.0–10.5)

## 2023-10-02 LAB — COMPREHENSIVE METABOLIC PANEL
ALT: 26 U/L (ref 0–53)
AST: 24 U/L (ref 0–37)
Albumin: 4.3 g/dL (ref 3.5–5.2)
Alkaline Phosphatase: 50 U/L (ref 39–117)
BUN: 16 mg/dL (ref 6–23)
CO2: 29 meq/L (ref 19–32)
Calcium: 9.6 mg/dL (ref 8.4–10.5)
Chloride: 98 meq/L (ref 96–112)
Creatinine, Ser: 1.02 mg/dL (ref 0.40–1.50)
GFR: 75.18 mL/min (ref >60.00)
Glucose, Bld: 110 mg/dL — ABNORMAL HIGH (ref 70–99)
Potassium: 4.5 meq/L (ref 3.5–5.1)
Sodium: 134 meq/L — ABNORMAL LOW (ref 135–145)
Total Bilirubin: 1.1 mg/dL (ref 0.2–1.2)
Total Protein: 6.5 g/dL (ref 6.0–8.3)

## 2023-10-02 LAB — URIC ACID: Uric Acid, Serum: 6.1 mg/dL (ref 4.0–7.8)

## 2023-10-02 LAB — TSH: TSH: 0.55 u[IU]/mL (ref 0.35–5.50)

## 2023-10-02 LAB — LIPID PANEL
Cholesterol: 130 mg/dL (ref 0–200)
HDL: 50.6 mg/dL (ref >39.00)
LDL Cholesterol: 65 mg/dL (ref 0–99)
NonHDL: 79.61
Total CHOL/HDL Ratio: 3
Triglycerides: 75 mg/dL (ref 0.0–149.0)
VLDL: 15 mg/dL (ref 0.0–40.0)

## 2023-10-02 LAB — HEMOGLOBIN A1C: Hgb A1c MFr Bld: 5.7 % (ref 4.6–6.5)

## 2023-10-02 LAB — VITAMIN D 25 HYDROXY (VIT D DEFICIENCY, FRACTURES): VITD: 80.99 ng/mL (ref 30.00–100.00)

## 2023-10-02 NOTE — Patient Instructions (Addendum)
 Could consider a supplement called L Tryptophan capsule for sleep (amino acid in [place] that makes [place] sleepy)89  [place] red light for sleep  Shingrix is the new shingles shot, 2 shots over 2-6 months, confirm coverage with insurance and document, then can return here for shots with nurse appt or at pharmacy    Insomnia Insomnia is a sleep disorder that makes it difficult to fall asleep or stay asleep. Insomnia can cause fatigue, low energy, difficulty concentrating, mood swings, and poor performance at work or school. There are three different ways to classify insomnia: Difficulty falling asleep. Difficulty staying asleep. Waking up too early in the morning. Any type of insomnia can be long-term (chronic) or short-term (acute). Both are common. Short-term insomnia usually lasts for 3 months or less. Chronic insomnia occurs at least three times a week for longer than 3 months. What are the causes? Insomnia may be caused by another condition, situation, or substance, such as: Having certain mental health conditions, such as anxiety and depression. Using caffeine, alcohol, tobacco, or drugs. Having gastrointestinal conditions, such as gastroesophageal reflux disease (GERD). Having certain medical conditions. These include: Asthma. Alzheimer's disease. Stroke. Chronic pain. An overactive thyroid gland (hyperthyroidism). Other sleep disorders, such as restless legs syndrome and sleep apnea. Menopause. Sometimes, the cause of insomnia may not be known. What increases the risk? Risk factors for insomnia include: Gender. Females are affected more often than males. Age. Insomnia is more common as people get older. Stress and certain medical and mental health conditions. Lack of exercise. Having an irregular work schedule. This may include working night shifts and traveling between different time zones. What are the signs or symptoms? If you have insomnia, the main symptom is having trouble  falling asleep or having trouble staying asleep. This may lead to other symptoms, such as: Feeling tired or having low energy. Feeling nervous about going to sleep. Not feeling rested in the morning. Having trouble concentrating. Feeling irritable, anxious, or depressed. How is this diagnosed? This condition may be diagnosed based on: Your symptoms and medical history. Your health care provider may ask about: Your sleep habits. Any medical conditions you have. Your mental health. A physical exam. How is this treated? Treatment for insomnia depends on the cause. Treatment may focus on treating an underlying condition that is causing the insomnia. Treatment may also include: Medicines to help you sleep. Counseling or therapy. Lifestyle adjustments to help you sleep better. Follow these instructions at home: Eating and drinking  Limit or avoid alcohol, caffeinated beverages, and products that contain nicotine and tobacco, especially close to bedtime. These can disrupt your sleep. Do not eat a large meal or eat spicy foods right before bedtime. This can lead to digestive discomfort that can make it hard for you to sleep. Sleep habits  Keep a sleep diary to help you and your health care provider figure out what could be causing your insomnia. Write down: When you sleep. When you wake up during the night. How well you sleep and how rested you feel the next day. Any side effects of medicines you are taking. What you eat and drink. Make your bedroom a dark, comfortable place where it is easy to fall asleep. Put up shades or blackout curtains to block light from outside. Use a white noise machine to block noise. Keep the temperature cool. Limit screen use before bedtime. This includes: Not watching TV. Not using your smartphone, tablet, or computer. Stick to a routine that includes going to  bed and waking up at the same times every day and night. This can help you fall asleep faster.  Consider making a quiet activity, such as reading, part of your nighttime routine. Try to avoid taking naps during the day so that you sleep better at night. Get out of bed if you are still awake after 15 minutes of trying to sleep. Keep the lights down, but try reading or doing a quiet activity. When you feel sleepy, go back to bed. General instructions Take over-the-counter and prescription medicines only as told by your health care provider. Exercise regularly as told by your health care provider. However, avoid exercising in the hours right before bedtime. Use relaxation techniques to manage stress. Ask your health care provider to suggest some techniques that may work well for you. These may include: Breathing exercises. Routines to release muscle tension. Visualizing peaceful scenes. Make sure that you drive carefully. Do not drive if you feel very sleepy. Keep all follow-up visits. This is important. Contact a health care provider if: You are tired throughout the day. You have trouble in your daily routine due to sleepiness. You continue to have sleep problems, or your sleep problems get worse. Get help right away if: You have thoughts about hurting yourself or someone else. Get help right away if you feel like you may hurt yourself or others, or have thoughts about taking your own life. Go to your nearest emergency room or: Call 911. Call the National Suicide Prevention Lifeline at 803-767-7034 or 988. This is open 24 hours a day. Text the Crisis Text Line at 620-503-3585. Summary Insomnia is a sleep disorder that makes it difficult to fall asleep or stay asleep. Insomnia can be long-term (chronic) or short-term (acute). Treatment for insomnia depends on the cause. Treatment may focus on treating an underlying condition that is causing the insomnia. Keep a sleep diary to help you and your health care provider figure out what could be causing your insomnia. This information is not  intended to replace advice given to you by your health care provider. Make sure you discuss any questions you have with your health care provider. Document Revised: 06/25/2021 Document Reviewed: 06/25/2021 Elsevier Patient Education  2024 [employer].

## 2023-10-02 NOTE — Progress Notes (Signed)
 Subjective:    Patient ID: Taylor Mcdonald, male    DOB: 03-15-1956, 68 y.o.   MRN: [id number]  Chief Complaint  Patient presents with  . Follow-up    6 month    HPI Discussed the use of AI scribe software for clinical note transcription with the patient, who gave verbal consent to proceed.  History of Present Illness Taylor Mcdonald is a 68 year old male who presents for a routine follow-up visit.  He occasionally monitors his blood pressure at home using an Om[employer] device, noting that his readings are typically lower than those taken in the office. He does not check his blood pressure regularly but does so when he feels unwell.  He experiences occasional difficulty sleeping, often waking up at 2 AM and finding it hard to return to sleep. He uses half an Ambien tablet when needed and sometimes takes one Advil to help with restlessness. No pain is associated with his sleep issues, and Advil is used for restlessness rather than pain.  He has noticed some soreness and hardness in his veins, particularly one that has become prominent and feels hard. He has not used compression socks. The veins do not bother him during activities such as walking or exercising.     Past Medical History:  Diagnosis Date  . Allergy   . Anxiety 03/04/2012  . Fatigue   . GERD (gastroesophageal reflux disease)   . Gout   . Hyperglycemia   . Hyperlipidemia   . Hypertension   . Insomnia   . Internal nasal lesion 07/04/2016  . Low testosterone   . Lower back pain 09/09/2012  . Migraine   . Pain of left heel 10/01/2011  . Preventative health care 07/24/2012  . RLS (restless legs syndrome)   . Sinusitis 03/04/2012  . Varicose veins of both lower extremities 12/12/2016  . Vasovagal near syncope 04/30/2013  . Vitamin D deficiency 12/23/2011    Past Surgical History:  Procedure Laterality Date  . LUMBAR LAMINECTOMY/DECOMPRESSION MICRODISCECTOMY N/A 05/13/2013   Procedure: LUMBAR LAMINECTOMY/DECOMPRESSION  MICRODISCECTOMY 1 LEVEL L3-4;  Surgeon: Javier Docker, MD;  Location: [facility];  Service: Orthopedics;  Laterality: N/A;  . PANENDOSCOPY    . TONSILLECTOMY      Family History  Problem Relation Age of Onset  . Obesity Mother   . Hypertension Son   . Hypertension Maternal Grandmother   . Obesity Maternal Grandmother   . Cancer Maternal Grandmother 50       intestinal  . Dementia Maternal Grandfather   . Colon cancer Neg Hx   . Esophageal cancer Neg Hx   . Rectal cancer Neg Hx   . Stomach cancer Neg Hx     Social History   Socioeconomic History  . Marital status: Married    Spouse name: Not on file  . Number of children: Not on file  . Years of education: Not on file  . Highest education level: Not on file  Occupational History  . Not on file  Tobacco Use  . Smoking status: Never  . Smokeless tobacco: Never  Vaping Use  . Vaping status: Never Used  Substance and Sexual Activity  . Alcohol use: No  . Drug use: Not Currently    Types: Hydrocodone    Comment: Pt states he does not use anymore.  Marland Kitchen Sexual activity: Yes  Other Topics Concern  . Not on file  Social History Narrative  . Not on file   Social Drivers of  Health   Financial Resource Strain: Low Risk  (08/03/2021)   Overall Financial Resource Strain (CARDIA)   . Difficulty of Paying Living Expenses: Not hard at all  Food Insecurity: No Food Insecurity (08/03/2021)   Hunger Vital Sign   . Worried About Programme researcher, broadcasting/film/video in the Last Year: Never true   . Ran Out of Food in the Last Year: Never true  Transportation Needs: No Transportation Needs (08/03/2021)   PRAPARE - Transportation   . Lack of Transportation (Medical): No   . Lack of Transportation (Non-Medical): No  Physical Activity: Insufficiently Active (08/03/2021)   Exercise Vital Sign   . Days of Exercise per Week: 4 days   . Minutes of Exercise per Session: 30 min  Stress: No Stress Concern Present (08/03/2021)   [employer] of Occupational  Health - Occupational Stress Questionnaire   . Feeling of Stress : Not at all  Social Connections: Moderately Integrated (08/03/2021)   Social Connection and Isolation Panel [NHANES]   . Frequency of Communication with Friends and Family: More than three times a week   . Frequency of Social Gatherings with Friends and Family: More than three times a week   . Attends Religious Services: More than 4 times per year   . Active Member of Clubs or Organizations: No   . Attends Banker Meetings: Never   . Marital Status: Married  Catering manager Violence: Not At Risk (08/03/2021)   Humiliation, Afraid, Rape, and Kick questionnaire   . Fear of Current or Ex-Partner: No   . Emotionally Abused: No   . Physically Abused: No   . Sexually Abused: No    Outpatient Medications Prior to Visit  Medication Sig Dispense Refill  . anastrozole (ARIMIDEX) 1 MG tablet Take 1/2 tablet (0.5 mg total) by mouth daily. 45 tablet 3  . aspirin 81 MG tablet Take 1 tablet (81 mg total) by mouth daily. Resume 4 days post-op 30 tablet   . atorvastatin (LIPITOR) 10 MG tablet Take 1 tablet (10 mg total) by mouth daily. 90 tablet 1  . Fish Oil-Cholecalciferol (FISH OIL + D3) 1200-1000 MG-UNIT CAPS Take 1 capsule by mouth daily.    . fluticasone (FLONASE) 50 MCG/ACT nasal spray Place 2 sprays into both nostrils daily. 48 g 1  . lisinopril (ZESTRIL) 20 MG tablet Take 1 tablet (20 mg total) by mouth daily. 90 tablet 1  . omeprazole (PRILOSEC) 20 MG capsule Take 1 capsule (20 mg total) by mouth daily. 90 capsule 1  . OVER THE COUNTER MEDICATION 12 hr decongestant 120mg     . propranolol (INDERAL) 40 MG tablet Take 1 tablet (40 mg total) by mouth daily. 90 tablet 1  . sildenafil (REVATIO) 20 MG tablet Take 1 tablet (20 mg total) by mouth as needed. 90 tablet 3  . testosterone cypionate (DEPOTESTOSTERONE CYPIONATE) 200 MG/ML injection Inject 1 mL (200 mg total) into the skin every 14 (fourteen) days. 10 mL 3  .  zolpidem (AMBIEN) 10 MG tablet Take 0.5-1 tablets (5-10 mg total) by mouth at bedtime as needed. 30 tablet 5  . allopurinol (ZYLOPRIM) 100 MG tablet Take 2 tablets (200 mg total) by mouth daily. 180 tablet 3   No facility-administered medications prior to visit.    No Known Allergies  Review of Systems  Constitutional:  Negative for fever and malaise/fatigue.  HENT:  Negative for congestion.   Eyes:  Negative for blurred vision.  Respiratory:  Negative for shortness of breath.  Cardiovascular:  Negative for chest pain, palpitations and leg swelling.  Gastrointestinal:  Negative for abdominal pain, blood in stool and nausea.  Genitourinary:  Negative for dysuria and frequency.  Musculoskeletal:  Negative for falls.  Skin:  Negative for rash.  Neurological:  Negative for dizziness, loss of consciousness and headaches.  Endo/Heme/Allergies:  Negative for environmental allergies.  Psychiatric/Behavioral:  Negative for depression. The patient has insomnia. The patient is not nervous/anxious.        Objective:    Physical Exam Vitals reviewed.  Constitutional:      Appearance: Normal appearance. He is not ill-appearing.  HENT:     Head: Normocephalic and atraumatic.     Nose: Nose normal.  Eyes:     Conjunctiva/sclera: Conjunctivae normal.  Cardiovascular:     Rate and Rhythm: Normal rate.     Pulses: Normal pulses.     Heart sounds: Normal heart sounds. No murmur heard. Pulmonary:     Effort: Pulmonary effort is normal.     Breath sounds: Normal breath sounds. No wheezing.  Abdominal:     Palpations: Abdomen is soft. There is no mass.     Tenderness: There is no abdominal tenderness.  Musculoskeletal:     Cervical back: Normal range of motion.     Right lower leg: No edema.     Left lower leg: No edema.  Skin:    General: Skin is warm and dry.  Neurological:     General: No focal deficit present.     Mental Status: He is alert and oriented to person, place, and time.   Psychiatric:        Mood and Affect: Mood normal.    BP 130/86 (BP Location: Left Arm, Patient Position: Sitting, Cuff Size: Large)   Pulse 72   Temp 97.6 F (36.4 C) (Oral)   Resp 16   Ht 6\' 2"  (1.88 m)   Wt 199 lb (90.3 kg)   SpO2 100%   BMI 25.55 kg/m  Wt Readings from Last 3 Encounters:  10/02/23 199 lb (90.3 kg)  04/03/23 195 lb 9.6 oz (88.7 kg)  10/01/22 195 lb 12.8 oz (88.8 kg)    Diabetic Foot Exam - Simple   No data filed    Lab Results  Component Value Date   WBC 8.2 10/02/2023   HGB 16.7 10/02/2023   HCT 50.4 10/02/2023   PLT 248.0 10/02/2023   GLUCOSE 110 (H) 10/02/2023   CHOL 130 10/02/2023   TRIG 75.0 10/02/2023   HDL 50.60 10/02/2023   LDLDIRECT 125.7 02/10/2013   LDLCALC 65 10/02/2023   ALT 26 10/02/2023   AST 24 10/02/2023   NA 134 (L) 10/02/2023   K 4.5 10/02/2023   CL 98 10/02/2023   CREATININE 1.02 10/02/2023   BUN 16 10/02/2023   CO2 29 10/02/2023   TSH 0.55 10/02/2023   PSA 0.64 12/25/2017   HGBA1C 5.7 10/02/2023   MICROALBUR <0.7 12/25/2017    Lab Results  Component Value Date   TSH 0.55 10/02/2023   Lab Results  Component Value Date   WBC 8.2 10/02/2023   HGB 16.7 10/02/2023   HCT 50.4 10/02/2023   MCV 87.4 10/02/2023   PLT 248.0 10/02/2023   Lab Results  Component Value Date   NA 134 (L) 10/02/2023   K 4.5 10/02/2023   CO2 29 10/02/2023   GLUCOSE 110 (H) 10/02/2023   BUN 16 10/02/2023   CREATININE 1.02 10/02/2023   BILITOT 1.1 10/02/2023   ALKPHOS  50 10/02/2023   AST 24 10/02/2023   ALT 26 10/02/2023   PROT 6.5 10/02/2023   ALBUMIN 4.3 10/02/2023   CALCIUM 9.6 10/02/2023   EGFR 64 05/08/2022   GFR 75.18 10/02/2023   Lab Results  Component Value Date   CHOL 130 10/02/2023   Lab Results  Component Value Date   HDL 50.60 10/02/2023   Lab Results  Component Value Date   LDLCALC 65 10/02/2023   Lab Results  Component Value Date   TRIG 75.0 10/02/2023   Lab Results  Component Value Date   CHOLHDL 3  10/02/2023   Lab Results  Component Value Date   HGBA1C 5.7 10/02/2023       Assessment & Plan:  Chronic gout without tophus, unspecified cause, unspecified site Assessment & Plan: Hydrate and monitor   Orders: -     Uric acid  Hyperglycemia Assessment & Plan: hgba1c acceptable, minimize simple carbs. Increase exercise as tolerated.   Orders: -     Comprehensive metabolic panel -     Hemoglobin A1c  Mixed hyperlipidemia Assessment & Plan: Tolerating statin, encouraged heart healthy diet, avoid trans fats, minimize simple carbs and saturated fats. Increase exercise as tolerated   Orders: -     Lipid panel  Primary hypertension Assessment & Plan: Well controlled, no changes to meds. Encouraged heart healthy diet such as the DASH diet and exercise as tolerated.    Orders: -     CBC with Differential/Platelet -     TSH  Insomnia, unspecified type Assessment & Plan: Encouraged good sleep hygiene such as dark, quiet room. No blue/green glowing lights such as computer screens in bedroom. No alcohol or stimulants in evening. Cut down on caffeine as able. Regular exercise is helpful but not just prior to bed time.  May continue Ambien prn   Hypogonadism in male Assessment & Plan: Following with Urology tolerating Testosterone.   Vitamin D deficiency Assessment & Plan: Supplement and monitor   Orders: -     VITAMIN D 25 Hydroxy (Vit-D Deficiency, Fractures)    Assessment and Plan Assessment & Plan Hypertension Patient reports occasional home blood pressure monitoring with readings in the 120s/70s. Discussed the importance of regular home monitoring and the significance of resting home blood pressure readings. -Encouraged monthly home blood pressure monitoring and to report any significant changes.  Insomnia Patient reports occasional difficulty sleeping, currently managed with half a dose of Ambien and occasional Advil. No pain reported. Discussed potential  alternatives including eltriptyphan and a red light sleep aid. -Consider trying eltriptyphan and a red light sleep aid (Helight) to improve sleep quality.  Superficial Thrombophlebitis Patient reports hard, occasionally sore veins in the legs. No significant pain or functional impairment reported. -Apply moist heat to sore areas twice daily. -Consider wearing compression socks to protect veins and potentially improve symptoms. -Consider referral to vascular surgery if symptoms worsen or do not improve with conservative management.  General Health Maintenance -Discussed the benefits of various vaccines including flu, pneumonia, RSV, and shingles. Patient expressed willingness to consider shingles vaccine due to potential link with reduced dementia risk. -Consider shingles vaccine. -Scheduled follow-up visit in 5-6 months and a complete physical exam in 9-10 months.     Danise Edge, MD

## 2023-10-02 NOTE — Assessment & Plan Note (Signed)
 Supplement and monitor

## 2023-10-05 MED ORDER — ANASTROZOLE 1 MG PO TABS
0.5000 mg | ORAL_TABLET | Freq: Every day | ORAL | 3 refills | Status: DC
  Filled 2023-10-05: qty 45, 90d supply, fill #0
  Filled 2024-01-25: qty 45, 90d supply, fill #1
  Filled 2024-05-24: qty 45, 90d supply, fill #2

## 2023-10-06 ENCOUNTER — Other Ambulatory Visit (HOSPITAL_BASED_OUTPATIENT_CLINIC_OR_DEPARTMENT_OTHER): Payer: Self-pay

## 2023-10-19 ENCOUNTER — Other Ambulatory Visit: Payer: Self-pay | Admitting: Family Medicine

## 2023-10-20 ENCOUNTER — Other Ambulatory Visit (HOSPITAL_BASED_OUTPATIENT_CLINIC_OR_DEPARTMENT_OTHER): Payer: Self-pay

## 2023-10-20 ENCOUNTER — Other Ambulatory Visit: Payer: Self-pay

## 2023-10-20 MED ORDER — ALLOPURINOL 100 MG PO TABS
200.0000 mg | ORAL_TABLET | Freq: Every day | ORAL | 3 refills | Status: AC
  Filled 2023-10-20: qty 180, 90d supply, fill #0
  Filled 2024-06-16: qty 180, 90d supply, fill #1

## 2023-11-07 ENCOUNTER — Other Ambulatory Visit: Payer: Self-pay

## 2023-11-10 ENCOUNTER — Other Ambulatory Visit (HOSPITAL_BASED_OUTPATIENT_CLINIC_OR_DEPARTMENT_OTHER): Payer: Self-pay

## 2023-11-13 ENCOUNTER — Other Ambulatory Visit: Payer: Medicare PPO

## 2023-11-19 ENCOUNTER — Ambulatory Visit: Payer: Medicare PPO | Admitting: Urology

## 2023-11-24 ENCOUNTER — Other Ambulatory Visit (HOSPITAL_BASED_OUTPATIENT_CLINIC_OR_DEPARTMENT_OTHER): Payer: Self-pay

## 2023-11-24 ENCOUNTER — Other Ambulatory Visit: Payer: Self-pay

## 2023-11-29 ENCOUNTER — Other Ambulatory Visit (HOSPITAL_BASED_OUTPATIENT_CLINIC_OR_DEPARTMENT_OTHER): Payer: Self-pay

## 2023-12-04 ENCOUNTER — Other Ambulatory Visit: Payer: Self-pay | Admitting: Family Medicine

## 2023-12-04 ENCOUNTER — Other Ambulatory Visit (HOSPITAL_BASED_OUTPATIENT_CLINIC_OR_DEPARTMENT_OTHER): Payer: Self-pay

## 2023-12-04 MED ORDER — ATORVASTATIN CALCIUM 10 MG PO TABS
10.0000 mg | ORAL_TABLET | Freq: Every day | ORAL | 1 refills | Status: DC
  Filled 2023-12-04: qty 90, 90d supply, fill #0
  Filled 2024-03-24: qty 80, 80d supply, fill #0

## 2023-12-29 ENCOUNTER — Other Ambulatory Visit: Payer: Self-pay | Admitting: Family Medicine

## 2023-12-29 ENCOUNTER — Other Ambulatory Visit (HOSPITAL_BASED_OUTPATIENT_CLINIC_OR_DEPARTMENT_OTHER): Payer: Self-pay

## 2023-12-29 MED ORDER — LISINOPRIL 20 MG PO TABS
20.0000 mg | ORAL_TABLET | Freq: Every day | ORAL | 1 refills | Status: DC
  Filled 2023-12-29: qty 90, 90d supply, fill #0
  Filled 2024-03-24: qty 80, 80d supply, fill #0

## 2024-01-13 ENCOUNTER — Other Ambulatory Visit: Payer: Self-pay | Admitting: Family Medicine

## 2024-01-13 ENCOUNTER — Other Ambulatory Visit (HOSPITAL_BASED_OUTPATIENT_CLINIC_OR_DEPARTMENT_OTHER): Payer: Self-pay

## 2024-01-13 MED ORDER — FLUTICASONE PROPIONATE 50 MCG/ACT NA SUSP
2.0000 | Freq: Every day | NASAL | 1 refills | Status: DC
  Filled 2024-01-13 – 2024-01-26 (×2): qty 48, 90d supply, fill #0
  Filled 2024-04-12: qty 48, 90d supply, fill #1

## 2024-01-25 ENCOUNTER — Other Ambulatory Visit: Payer: Self-pay | Admitting: Family Medicine

## 2024-01-26 ENCOUNTER — Other Ambulatory Visit (HOSPITAL_BASED_OUTPATIENT_CLINIC_OR_DEPARTMENT_OTHER): Payer: Self-pay

## 2024-01-26 MED ORDER — OMEPRAZOLE 20 MG PO CPDR
20.0000 mg | DELAYED_RELEASE_CAPSULE | Freq: Every day | ORAL | 0 refills | Status: DC
Start: 2024-01-26 — End: 2024-05-03
  Filled 2024-01-26: qty 90, 90d supply, fill #0

## 2024-01-27 ENCOUNTER — Other Ambulatory Visit (HOSPITAL_BASED_OUTPATIENT_CLINIC_OR_DEPARTMENT_OTHER): Payer: Self-pay

## 2024-02-02 ENCOUNTER — Other Ambulatory Visit: Payer: Self-pay

## 2024-02-02 ENCOUNTER — Other Ambulatory Visit

## 2024-02-02 DIAGNOSIS — E291 Testicular hypofunction: Secondary | ICD-10-CM

## 2024-02-04 DIAGNOSIS — E291 Testicular hypofunction: Secondary | ICD-10-CM | POA: Diagnosis not present

## 2024-02-05 LAB — TESTOSTERONE,FREE AND TOTAL
Testosterone, Free: 19.6 pg/mL — ABNORMAL HIGH (ref 6.6–18.1)
Testosterone: 810 ng/dL (ref 264–916)

## 2024-02-09 ENCOUNTER — Other Ambulatory Visit: Payer: Self-pay

## 2024-02-09 ENCOUNTER — Other Ambulatory Visit (HOSPITAL_BASED_OUTPATIENT_CLINIC_OR_DEPARTMENT_OTHER): Payer: Self-pay

## 2024-02-09 ENCOUNTER — Other Ambulatory Visit: Payer: Self-pay | Admitting: Family Medicine

## 2024-02-09 MED ORDER — PROPRANOLOL HCL 40 MG PO TABS
40.0000 mg | ORAL_TABLET | Freq: Every day | ORAL | 0 refills | Status: DC
  Filled 2024-02-09 (×3): qty 90, 90d supply, fill #0

## 2024-02-10 ENCOUNTER — Ambulatory Visit: Payer: Self-pay | Admitting: Urology

## 2024-02-10 ENCOUNTER — Other Ambulatory Visit (HOSPITAL_COMMUNITY): Payer: Self-pay

## 2024-02-13 ENCOUNTER — Ambulatory Visit (INDEPENDENT_AMBULATORY_CARE_PROVIDER_SITE_OTHER): Admitting: Urology

## 2024-02-13 ENCOUNTER — Telehealth: Payer: Self-pay | Admitting: Urology

## 2024-02-13 ENCOUNTER — Other Ambulatory Visit: Payer: Self-pay

## 2024-02-13 ENCOUNTER — Other Ambulatory Visit (HOSPITAL_BASED_OUTPATIENT_CLINIC_OR_DEPARTMENT_OTHER): Payer: Self-pay

## 2024-02-13 ENCOUNTER — Encounter: Payer: Self-pay | Admitting: Urology

## 2024-02-13 VITALS — BP 122/69 | HR 76

## 2024-02-13 DIAGNOSIS — E291 Testicular hypofunction: Secondary | ICD-10-CM | POA: Diagnosis not present

## 2024-02-13 DIAGNOSIS — N5201 Erectile dysfunction due to arterial insufficiency: Secondary | ICD-10-CM | POA: Diagnosis not present

## 2024-02-13 LAB — URINALYSIS, ROUTINE W REFLEX MICROSCOPIC
Bilirubin, UA: NEGATIVE
Glucose, UA: NEGATIVE
Ketones, UA: NEGATIVE
Leukocytes,UA: NEGATIVE
Nitrite, UA: NEGATIVE
Protein,UA: NEGATIVE
RBC, UA: NEGATIVE
Specific Gravity, UA: <1.005 — ABNORMAL LOW (ref 1.005–1.030)
Urobilinogen, Ur: 0.2 mg/dL (ref 0.2–1.0)
pH, UA: 6 (ref 5.0–7.5)

## 2024-02-13 MED ORDER — TESTOSTERONE CYPIONATE 200 MG/ML IM SOLN
200.0000 mg | INTRAMUSCULAR | 3 refills | Status: DC
  Filled 2024-02-13: qty 6, 84d supply, fill #0
  Filled 2024-05-11: qty 6, 84d supply, fill #1
  Filled 2024-08-02: qty 6, 84d supply, fill #2

## 2024-02-13 MED ORDER — TADALAFIL 20 MG PO TABS: 20.0000 mg | ORAL_TABLET | ORAL | 5 refills | Status: AC | PRN

## 2024-02-13 NOTE — Telephone Encounter (Signed)
 Release labs so he can have the done at [facility] in Jan.

## 2024-02-13 NOTE — Progress Notes (Signed)
 02/13/2024 11:45 AM   Taylor Mcdonald 10-26-1955 [id number]  Referring provider: Domenica Mcdonald LABOR, MD [id number] [place],  [place] [address]  Followup hypogonadism    HPI: Taylor Mcdonald is a 68yo here for folowup for hypogonadism and erectile dysfunction. Testosterone  832, hemoglobin 16.7. He is on 200mg  of IM testosterone  every 2 weeks. Energy is good. Libido is good. IPSS 4 QOL 0. Uirne stream strong. No straining to urinate. He uses tadalafil  20mg  prn with good results   PMH: Past Medical History:  Diagnosis Date   Allergy    Anxiety 03/04/2012   Fatigue    GERD (gastroesophageal reflux disease)    Gout    Hyperglycemia    Hyperlipidemia    Hypertension    Insomnia    Internal nasal lesion 07/04/2016   Low testosterone     Lower back pain 09/09/2012   Migraine    Pain of left heel 10/01/2011   Preventative health care 07/24/2012   RLS (restless legs syndrome)    Sinusitis 03/04/2012   Varicose veins of both lower extremities 12/12/2016   Vasovagal near syncope 04/30/2013   Vitamin D  deficiency 12/23/2011    Surgical History: Past Surgical History:  Procedure Laterality Date   LUMBAR LAMINECTOMY/DECOMPRESSION MICRODISCECTOMY N/A 05/13/2013   Procedure: LUMBAR LAMINECTOMY/DECOMPRESSION MICRODISCECTOMY 1 LEVEL L3-4;  Surgeon: Taylor JAYSON Billing, MD;  Location: [facility];  Service: Orthopedics;  Laterality: N/A;   PANENDOSCOPY     TONSILLECTOMY      Home Medications:  Allergies as of 02/13/2024   No Known Allergies      Medication List        Accurate as of February 13, 2024 11:45 AM. If you have any questions, ask your nurse or doctor.          allopurinol  100 MG tablet Commonly known as: ZYLOPRIM  Take 2 tablets (200 mg total) by mouth daily.   anastrozole  1 MG tablet Commonly known as: ARIMIDEX  Take 1/2 tablet (0.5 mg total) by mouth daily.   aspirin  81 MG tablet Take 1 tablet (81 mg total) by mouth daily. Resume 4 days post-op   atorvastatin  10 MG  tablet Commonly known as: LIPITOR Take 1 tablet (10 mg total) by mouth daily.   Fish Oil + D3 1200-1000 MG-UNIT Caps Take 1 capsule by mouth daily.   fluticasone  50 MCG/ACT nasal spray Commonly known as: FLONASE  Place 2 sprays into both nostrils daily.   lisinopril  20 MG tablet Commonly known as: ZESTRIL  Take 1 tablet (20 mg total) by mouth daily.   omeprazole  20 MG capsule Commonly known as: PRILOSEC Take 1 capsule (20 mg total) by mouth daily.   OVER THE COUNTER MEDICATION 12 hr decongestant 120mg    propranolol  40 MG tablet Commonly known as: INDERAL  Take 1 tablet (40 mg total) by mouth daily.   sildenafil  20 MG tablet Commonly known as: REVATIO  Take 1 tablet (20 mg total) by mouth as needed.   testosterone  cypionate 200 MG/ML injection Commonly known as: DEPOTESTOSTERONE CYPIONATE Inject 1 mL (200 mg total) into the skin every 14 (fourteen) days.   zolpidem  10 MG tablet Commonly known as: AMBIEN  Take 0.5-1 tablets (5-10 mg total) by mouth at bedtime as needed.        Allergies: No Known Allergies  Family History: Family History  Problem Relation Age of Onset   Obesity Mother    Hypertension Son    Hypertension Maternal Grandmother    Obesity Maternal Grandmother  Cancer Maternal Grandmother 19       intestinal   Dementia Maternal Grandfather    Colon cancer Neg Hx    Esophageal cancer Neg Hx    Rectal cancer Neg Hx    Stomach cancer Neg Hx     Social History:  reports that he has never smoked. He has never used smokeless tobacco. He reports that he does not currently use drugs after having used the following drugs: Hydrocodone . He reports that he does not drink alcohol.  ROS: All other review of systems were reviewed and are negative except what is noted above in HPI  Physical Exam: BP 122/69   Pulse 76   Constitutional:  Alert and oriented, No acute distress. HEENT: [place] AT, moist mucus membranes.  Trachea midline, no masses. Cardiovascular:  No clubbing, cyanosis, or edema. Respiratory: Normal respiratory effort, no increased work of breathing. GI: Abdomen is soft, nontender, nondistended, no abdominal masses GU: No CVA tenderness.  Lymph: No cervical or inguinal lymphadenopathy. Skin: No rashes, bruises or suspicious lesions. Neurologic: Grossly intact, no focal deficits, moving all 4 extremities. Psychiatric: Normal mood and affect.  Laboratory Data: Lab Results  Component Value Date   WBC 8.2 10/02/2023   HGB 16.7 10/02/2023   HCT 50.4 10/02/2023   MCV 87.4 10/02/2023   PLT 248.0 10/02/2023    Lab Results  Component Value Date   CREATININE 1.02 10/02/2023    Lab Results  Component Value Date   PSA 0.64 12/25/2017   PSA 0.63 11/23/2015   PSA 0.50 08/25/2014    Lab Results  Component Value Date   TESTOSTERONE  810 02/04/2024    Lab Results  Component Value Date   HGBA1C 5.7 10/02/2023    Urinalysis    Component Value Date/Time   COLORURINE YELLOW 12/25/2017 1037   APPEARANCEUR Clear 05/15/2022 1027   LABSPEC 1.010 12/25/2017 1037   PHURINE 6.0 12/25/2017 1037   GLUCOSEU Negative 05/15/2022 1027   GLUCOSEU NEGATIVE 12/25/2017 1037   HGBUR NEGATIVE 12/25/2017 1037   HGBUR trace-intact 07/25/2009 0000   BILIRUBINUR Negative 05/15/2022 1027   KETONESUR NEGATIVE 12/25/2017 1037   PROTEINUR Negative 05/15/2022 1027   UROBILINOGEN 0.2 12/25/2017 1037   NITRITE Negative 05/15/2022 1027   NITRITE NEGATIVE 12/25/2017 1037   LEUKOCYTESUR Negative 05/15/2022 1027    Lab Results  Component Value Date   LABMICR Comment 05/15/2022    Pertinent Imaging:  No results found for this or any previous visit.  No results found for this or any previous visit.  No results found for this or any previous visit.  No results found for this or any previous visit.  No results found for this or any previous visit.  No results found for this or any previous visit.  No results found for this or any previous  visit.  No results found for this or any previous visit.   Assessment & Plan:    1. Hypogonadism in male (Primary) Followup 6 months with testosterone  labs -continue IM testosteroen 200mg  every 2 weeks - Urinalysis, Routine w reflex microscopic  2. Erectile dysfunction due to arterial insufficiency -continue tadalafil  20mg  prn   No follow-ups on file.  Belvie Clara, MD  [facility] [place]

## 2024-02-13 NOTE — Telephone Encounter (Signed)
 Pt was made aware lab orders was upfront. Pt state's that he would like lab order released to [facility]. Pt is aware I will follow up on lab orders.

## 2024-02-13 NOTE — Patient Instructions (Signed)
 Hypogonadism, Male  Male hypogonadism is a condition of having a level of testosterone  that is lower than normal. Testosterone  is a chemical, or hormone, that is made mainly in the testicles. In boys, testosterone  is responsible for the development of male characteristics during puberty. These include: Making the penis bigger. Growing and building the muscles. Growing facial hair. Deepening the voice. In adult men, testosterone  is responsible for maintaining: An interest in sex and the ability to have sex. Muscle mass. Sperm production. Red blood cell production. Bone strength. Testosterone  also gives men energy and a sense of well-being. Testosterone  normally decreases as men age and the testicles make less testosterone . Testosterone  levels can vary from man to man. Not all men will have signs and symptoms of low testosterone . Weight, alcohol use, medicines, and certain medical conditions can affect a man's testosterone  level. What are the causes? This condition is caused by: A natural decrease in testosterone  that occurs as a man grows older. This is the main cause of this condition. Use of medicines, such as antidepressants, steroids, and opioids. Diseases and conditions that affect the testicles or the making of testosterone . These include: Injury or damage to the testicles from trauma, cancer, cancer treatment, or infection. Diabetes. Sleep apnea. Genetic conditions that men are born with. Disease of the pituitary gland. This gland is in the brain. It produces hormones. Obesity. Metabolic syndrome. This is a group of diseases that affect blood pressure, blood sugar, cholesterol, and belly fat. HIV or AIDS. Alcohol abuse. Kidney failure. Other long-term or chronic diseases. What are the signs or symptoms? Common symptoms of this condition include: Loss of interest in sex (low sex drive). Inability to have or maintain an erection (erectile dysfunction). Feeling tired  (fatigue). Mood changes, like irritability or depression. Loss of muscle and body hair. Infertility. Large breasts. Weight gain (obesity). How is this diagnosed? Your health care provider can diagnose hypogonadism based on: Your signs and symptoms. A physical exam to check your testosterone  levels. This includes blood tests. Testosterone  levels can change throughout the day. Levels are highest in the morning. You may need to have repeat blood tests before getting a diagnosis of hypogonadism. Depending on your medical history and test results, your health care provider may also do other tests to find the cause of low testosterone . How is this treated? This condition is treated with testosterone  replacement therapy. Testosterone  can be given by: Injection or through pellets inserted under the skin. Gels or patches placed on the skin or in the mouth. Testosterone  therapy is not for everyone. It has risks and side effects. Your health care provider will consider your medical history, your risk for prostate cancer, your age, and your symptoms before putting you on testosterone  replacement therapy. Follow these instructions at home: Take over-the-counter and prescription medicines only as told by your health care provider. Eat foods that are high in fiber, such as beans, whole grains, and fresh fruits and vegetables. Limit foods that are high in fat and processed sugars, such as fried or sweet foods. If you drink alcohol: Limit how much you have to 0-2 drinks a day. Know how much alcohol is in your drink. In the [place] one drink equals one 12 oz bottle of beer (355 mL), one 5 oz glass of wine (148 mL), or one 1 oz glass of hard liquor (44 mL). Return to your normal activities as told by your health care provider. Ask your health care provider what activities are safe for you. Keep all  follow-up visits. This is important. Contact a health care provider if: You have any of the signs or symptoms of  low testosterone . You have any side effects from testosterone  therapy. Summary Male hypogonadism is a condition of having a level of testosterone  that is lower than normal. The natural drop in testosterone  production that occurs with age is the most common cause of this condition. Low testosterone  can also be caused by many diseases and conditions that affect the testicles and the making of testosterone . This condition is treated with testosterone  replacement therapy. There are risks and side effects of testosterone  therapy. Your health care provider will consider your age, medical history, symptoms, and risks for prostate cancer before putting you on testosterone  therapy. This information is not intended to replace advice given to you by your health care provider. Make sure you discuss any questions you have with your health care provider. Document Revised: 06/23/2023 Document Reviewed: 06/23/2023 Elsevier Patient Education  2025 [employer].

## 2024-02-23 ENCOUNTER — Other Ambulatory Visit: Payer: Self-pay | Admitting: Family Medicine

## 2024-02-23 ENCOUNTER — Other Ambulatory Visit (HOSPITAL_BASED_OUTPATIENT_CLINIC_OR_DEPARTMENT_OTHER): Payer: Self-pay

## 2024-02-23 ENCOUNTER — Other Ambulatory Visit (HOSPITAL_COMMUNITY): Payer: Self-pay

## 2024-02-24 ENCOUNTER — Other Ambulatory Visit: Payer: Self-pay | Admitting: Family Medicine

## 2024-02-24 ENCOUNTER — Other Ambulatory Visit (HOSPITAL_BASED_OUTPATIENT_CLINIC_OR_DEPARTMENT_OTHER): Payer: Self-pay

## 2024-02-24 ENCOUNTER — Other Ambulatory Visit (HOSPITAL_COMMUNITY): Payer: Self-pay

## 2024-02-24 ENCOUNTER — Telehealth: Payer: Self-pay

## 2024-02-24 MED ORDER — PROPRANOLOL HCL 40 MG PO TABS
40.0000 mg | ORAL_TABLET | Freq: Every day | ORAL | 1 refills | Status: DC
  Filled 2024-02-24 (×2): qty 90, 90d supply, fill #0

## 2024-02-24 MED ORDER — PROPRANOLOL HCL 40 MG PO TABS
40.0000 mg | ORAL_TABLET | Freq: Every day | ORAL | 0 refills | Status: AC
  Filled 2024-08-19 – 2024-08-20 (×2): qty 90, 90d supply, fill #0

## 2024-02-24 NOTE — Telephone Encounter (Signed)
 Medication was send to [facility] already.

## 2024-02-24 NOTE — Telephone Encounter (Signed)
 Copied from CRM [id number]. Topic: Clinical - Medication Refill >> Feb 24, 2024 10:17 AM Pinkey ORN wrote: Medication: propranolol  (INDERAL ) 40 MG tablet  Has the patient contacted their pharmacy? Yes (Agent: If no, request that the patient contact the pharmacy for the refill. If patient does not wish to contact the pharmacy document the reason why and proceed with request.) (Agent: If yes, when and what did the pharmacy advise?)  This is the patient's preferred pharmacy:    [facility] [address] [place] [address] Phone: [phone] Fax: [phone]  Is this the correct pharmacy for this prescription? Yes If no, delete pharmacy and type the correct one.   Has the prescription been filled recently? No  Is the patient out of the medication? Yes  Has the patient been seen for an appointment in the last year OR does the patient have an upcoming appointment? Yes  Can we respond through MyChart? Yes  Agent: Please be advised that Rx refills may take up to 3 business days. We ask that you follow-up with your pharmacy.

## 2024-02-24 NOTE — Telephone Encounter (Signed)
 Copied from CRM [id number]. Topic: Clinical - Prescription Issue >> Feb 24, 2024 12:10 PM Mesmerise C wrote: Reason for CRM: Patient stated his medication for Propanolol needs to go to [facility] first until he's back home

## 2024-03-05 ENCOUNTER — Other Ambulatory Visit: Payer: Self-pay | Admitting: Family

## 2024-03-05 DIAGNOSIS — G4700 Insomnia, unspecified: Secondary | ICD-10-CM

## 2024-03-09 ENCOUNTER — Other Ambulatory Visit (HOSPITAL_BASED_OUTPATIENT_CLINIC_OR_DEPARTMENT_OTHER): Payer: Self-pay

## 2024-03-11 ENCOUNTER — Other Ambulatory Visit: Payer: Self-pay | Admitting: Family Medicine

## 2024-03-11 ENCOUNTER — Other Ambulatory Visit: Payer: Self-pay

## 2024-03-11 ENCOUNTER — Other Ambulatory Visit (HOSPITAL_BASED_OUTPATIENT_CLINIC_OR_DEPARTMENT_OTHER): Payer: Self-pay

## 2024-03-11 DIAGNOSIS — G4700 Insomnia, unspecified: Secondary | ICD-10-CM

## 2024-03-11 MED ORDER — ZOLPIDEM TARTRATE 10 MG PO TABS
5.0000 mg | ORAL_TABLET | Freq: Every evening | ORAL | 5 refills | Status: AC | PRN
  Filled 2024-03-11: qty 30, 30d supply, fill #0
  Filled 2024-05-03: qty 30, 30d supply, fill #1
  Filled 2024-07-05: qty 30, 30d supply, fill #2
  Filled 2024-09-01: qty 30, 30d supply, fill #3

## 2024-03-11 NOTE — Telephone Encounter (Signed)
 Requesting: Ambien   Contract:03/25/22 UDS: 03/25/22 Last Visit: 10/02/23 Next Visit: 09/09/24 Last Refill: 07/11/23 #30 and 2RF   Please Advise

## 2024-03-11 NOTE — Telephone Encounter (Signed)
 Copied from CRM [id number]. Topic: Clinical - Medication Question >> Mar 11, 2024 10:36 AM Armenia J wrote: Reason for CRM: Sapnan calling from [facility] in regards to the patient's zolpidem  (AMBIEN ) 10 MG tablet. She stated that the patient will be going out of town tomorrow and would like to see if we could send this in for him b today.   She said that they originally made a mistake by sending the medication to the wrong provider at first and now the patient is completley out.

## 2024-03-11 NOTE — Telephone Encounter (Signed)
 Rx for Ambien  already sent in earlier today. See med list.

## 2024-03-24 ENCOUNTER — Other Ambulatory Visit (HOSPITAL_BASED_OUTPATIENT_CLINIC_OR_DEPARTMENT_OTHER): Payer: Self-pay

## 2024-03-24 ENCOUNTER — Other Ambulatory Visit: Payer: Self-pay

## 2024-03-25 ENCOUNTER — Other Ambulatory Visit (HOSPITAL_BASED_OUTPATIENT_CLINIC_OR_DEPARTMENT_OTHER): Payer: Self-pay

## 2024-03-25 NOTE — Progress Notes (Signed)
 Subjective:     Patient ID: Taylor Mcdonald, male    DOB: 1956-04-08, 68 y.o.   MRN: [id number]  Chief Complaint  Patient presents with   Annual Exam    HPI    Taylor Mcdonald a 68 y.o.  male patient presents for a complete physical exam. Reports maintaining a generally healthy diet,  and exercising at the [facility] 3-4 times per week, primarily using the elliptical. States he is sleeping fairly well.  He is active with his grandchildren and helps taking them to school during the week, most days.  Overall doing well.     Presents with c/o acute back pain flare. Past medical history of laminectomy/decompression. Patient complains of acute back pain, reporting flare up today. Denies recent trauma, falls, numbness, tingling, lower extremity weakness, loss of bowel or bladder control, or fever.  HTN Lisinopril  20 mg daily BP at home- checks occasionally SBP- 120s/ 80s DBP  HLD-Lipitor 10 mg daily, fish oil  History of chronic gout-on allopurinol  200 mg daily   GERD- Omeprazole  20mg  daily  Hypogonadism/ED-follows with urology-[facility] [place] Dr. Sherrilee Sildenafil  20 mg tab prn, Anastrazole,  Takes medication as precribed, reports no SEs.  Patient denies fever, chills, SOB, CP, palpitations, dyspnea, edema, HA, vision changes, N/V/D, abdominal pain, urinary symptoms, rash, weight changes, and recent illness or hospitalizations.   History of Present Illness              Health Maintenance Due  Topic Date Due   Medicare Annual Wellness (AWV)  08/03/2022    Past Medical History:  Diagnosis Date   Allergy    Anxiety 03/04/2012   Fatigue    GERD (gastroesophageal reflux disease)    Gout    Hyperglycemia    Hyperlipidemia    Hypertension    Insomnia    Internal nasal lesion 07/04/2016   Low testosterone     Lower back pain 09/09/2012   Migraine    Pain of left heel 10/01/2011   Preventative health care 07/24/2012   RLS (restless legs syndrome)     Sinusitis 03/04/2012   Varicose veins of both lower extremities 12/12/2016   Vasovagal near syncope 04/30/2013   Vitamin D  deficiency 12/23/2011    Past Surgical History:  Procedure Laterality Date   LUMBAR LAMINECTOMY/DECOMPRESSION MICRODISCECTOMY N/A 05/13/2013   Procedure: LUMBAR LAMINECTOMY/DECOMPRESSION MICRODISCECTOMY 1 LEVEL L3-4;  Surgeon: Reyes JAYSON Billing, MD;  Location: [facility];  Service: Orthopedics;  Laterality: N/A;   PANENDOSCOPY     TONSILLECTOMY      Family History  Problem Relation Age of Onset   Obesity Mother    Hypertension Son    Hypertension Maternal Grandmother    Obesity Maternal Grandmother    Cancer Maternal Grandmother 66       intestinal   Dementia Maternal Grandfather    Colon cancer Neg Hx    Esophageal cancer Neg Hx    Rectal cancer Neg Hx    Stomach cancer Neg Hx     Social History   Socioeconomic History   Marital status: Married    Spouse name: Not on file   Number of children: Not on file   Years of education: Not on file   Highest education level: Not on file  Occupational History   Not on file  Tobacco Use   Smoking status: Never   Smokeless tobacco: Never  Vaping Use   Vaping status: Never Used  Substance and Sexual Activity   Alcohol use: No  Drug use: Not Currently    Types: Hydrocodone     Comment: Pt states he does not use anymore.   Sexual activity: Yes  Other Topics Concern   Not on file  Social History Narrative   Not on file   Social Drivers of Health   Financial Resource Strain: Low Risk  (08/03/2021)   Overall Financial Resource Strain (CARDIA)    Difficulty of Paying Living Expenses: Not hard at all  Food Insecurity: No Food Insecurity (08/03/2021)   Hunger Vital Sign    Worried About Running Out of Food in the Last Year: Never true    Ran Out of Food in the Last Year: Never true  Transportation Needs: No Transportation Needs (08/03/2021)   PRAPARE - Administrator, Civil Service (Medical): No    Lack of  Transportation (Non-Medical): No  Physical Activity: Insufficiently Active (08/03/2021)   Exercise Vital Sign    Days of Exercise per Week: 4 days    Minutes of Exercise per Session: 30 min  Stress: No Stress Concern Present (08/03/2021)   [employer] of Occupational Health - Occupational Stress Questionnaire    Feeling of Stress : Not at all  Social Connections: Moderately Integrated (08/03/2021)   Social Connection and Isolation Panel    Frequency of Communication with Friends and Family: More than three times a week    Frequency of Social Gatherings with Friends and Family: More than three times a week    Attends Religious Services: More than 4 times per year    Active Member of [employer] or Organizations: No    Attends Banker Meetings: Never    Marital Status: Married  Catering manager Violence: Not At Risk (08/03/2021)   Humiliation, Afraid, Rape, and Kick questionnaire    Fear of Current or Ex-Partner: No    Emotionally Abused: No    Physically Abused: No    Sexually Abused: No    Outpatient Medications Prior to Visit  Medication Sig Dispense Refill   allopurinol  (ZYLOPRIM ) 100 MG tablet Take 2 tablets (200 mg total) by mouth daily. 180 tablet 3   anastrozole  (ARIMIDEX ) 1 MG tablet Take 1/2 tablet (0.5 mg total) by mouth daily. 45 tablet 3   aspirin  81 MG tablet Take 1 tablet (81 mg total) by mouth daily. Resume 4 days post-op 30 tablet    atorvastatin  (LIPITOR) 10 MG tablet Take 1 tablet (10 mg total) by mouth daily. 90 tablet 1   Fish Oil-Cholecalciferol (FISH OIL + D3) 1200-1000 MG-UNIT CAPS Take 1 capsule by mouth daily.     fluticasone  (FLONASE ) 50 MCG/ACT nasal spray Place 2 sprays into both nostrils daily. 48 g 1   lisinopril  (ZESTRIL ) 20 MG tablet Take 1 tablet (20 mg total) by mouth daily. 90 tablet 1   omeprazole  (PRILOSEC) 20 MG capsule Take 1 capsule (20 mg total) by mouth daily. 90 capsule 0   OVER THE COUNTER MEDICATION 12 hr decongestant 120mg       propranolol  (INDERAL ) 40 MG tablet Take 1 tablet (40 mg total) by mouth daily. 90 tablet 0   sildenafil  (REVATIO ) 20 MG tablet Take 1 tablet (20 mg total) by mouth as needed. 90 tablet 3   tadalafil  (CIALIS ) 20 MG tablet Take 1 tablet (20 mg total) by mouth as needed. 30 tablet 5   testosterone  cypionate (DEPOTESTOSTERONE CYPIONATE) 200 MG/ML injection Inject 1 mL (200 mg total) into the skin every 14 (fourteen) days. 10 mL 3   zolpidem  (AMBIEN ) 10  MG tablet Take 0.5-1 tablets (5-10 mg total) by mouth at bedtime as needed. 30 tablet 5   No facility-administered medications prior to visit.    No Known Allergies  ROS    See HPI Objective:    Physical Exam Constitutional:      General: He is not in acute distress.    Appearance: He is not ill-appearing, toxic-appearing or diaphoretic.  HENT:     Head: Normocephalic and atraumatic.     Right Ear: Ear canal and external ear normal. There is impacted cerumen.     Left Ear: Tympanic membrane, ear canal and external ear normal.     Nose: Nose normal. No congestion.     Mouth/Throat:     Mouth: Mucous membranes are moist.     Pharynx: Oropharynx is clear.  Eyes:     Extraocular Movements: Extraocular movements intact.     Right eye: Normal extraocular motion.     Left eye: Normal extraocular motion.     Conjunctiva/sclera: Conjunctivae normal.     Pupils: Pupils are equal, round, and reactive to light.  Neck:     Thyroid : No thyroid  mass or thyromegaly.     Vascular: No carotid bruit or JVD.  Cardiovascular:     Rate and Rhythm: Normal rate and regular rhythm.     Pulses: Normal pulses.          Radial pulses are 2+ on the right side and 2+ on the left side.       Dorsalis pedis pulses are 2+ on the right side and 2+ on the left side.     Heart sounds: Normal heart sounds, S1 normal and S2 normal. No murmur heard.    No friction rub. No gallop.  Pulmonary:     Effort: Pulmonary effort is normal. No respiratory distress.      Breath sounds: Normal breath sounds.  Abdominal:     General: Bowel sounds are normal. There is no distension.     Palpations: Abdomen is soft.     Tenderness: There is no abdominal tenderness. There is no guarding.  Musculoskeletal:        General: Normal range of motion.     Cervical back: Full passive range of motion without pain and normal range of motion. No edema or erythema.     Right lower leg: No edema.     Left lower leg: No edema.  Lymphadenopathy:     Cervical: No cervical adenopathy.  Skin:    General: Skin is warm and dry.     Capillary Refill: Capillary refill takes less than 2 seconds.  Neurological:     General: No focal deficit present.     Mental Status: He is alert and oriented to person, place, and time.     Cranial Nerves: No cranial nerve deficit.     Motor: No weakness.     Coordination: Coordination normal.     Gait: Gait normal.     Deep Tendon Reflexes: Reflexes normal.  Psychiatric:        Mood and Affect: Mood normal.        Behavior: Behavior normal.        Thought Content: Thought content normal.     BP 128/82 (BP Location: Left Arm, Patient Position: Sitting, Cuff Size: Normal)   Pulse 71   Temp 97.7 F (36.5 C) (Oral)   Resp 12   Ht 6' 2 (1.88 m)   Wt 198 lb 9.6 oz (90.1 kg)  SpO2 99%   BMI 25.50 kg/m  Wt Readings from Last 3 Encounters:  03/31/24 198 lb 9.6 oz (90.1 kg)  10/02/23 199 lb (90.3 kg)  04/03/23 195 lb 9.6 oz (88.7 kg)       Assessment & Plan:   Problem List Items Addressed This Visit     Erectile dysfunction   Follows with Urology- Dr. Sherrilee.       GERD (gastroesophageal reflux disease)   Stable on current medications.  Counseled on lifestyle modifications including avoiding trigger foods (spicy, fatty, acidic), eating smaller meals, not lying down within 2-3 hours of eating.      Gout   Hydrate and monitor.  Reports no recent flares.  Check uric acid level today.      Relevant Orders   Uric acid    Hyperlipidemia   Tolerating statin, encouraged heart healthy diet, avoid trans fats, minimize simple carbs and saturated fats. Increase exercise as tolerated         Relevant Orders   Comprehensive metabolic panel with GFR   Lipid panel   Hypertension   Well controlled on lisinopril  20 mg daily, no changes to meds. Encouraged heart healthy diet such as the DASH diet and exercise as tolerated.        Relevant Orders   CBC with Differential/Platelet   TSH   Impacted cerumen of right ear   TM WNL post irrigation. Advise OTC Debrox as needed to prevent cerumen impactions.       Lower back pain   Acute flare, Rx- prednisone  taper. Encouraged moist heat and gentle stretching as tolerated. May try NSAIDs and prescription meds as directed and report if symptoms worsen or seek immediate care.      Relevant Medications   predniSONE  (DELTASONE ) 10 MG tablet   Preventative health care   Patient encouraged to maintain heart healthy diet, regular exercise, adequate sleep. Consider daily probiotics. Take medications as prescribed. Labs ordered and reviewed.         Vitamin D  deficiency   Supplement and monitor.      Relevant Orders   Vitamin D  (25 hydroxy)   Other Visit Diagnoses       General medical exam    -  Primary      FU 6 months  Portions of this note were dictated using DRAGON voice recognition software. Please disregard any errors in transcription.       I am having Jacques MICAEL Blush maintain his Fish Oil + D3, aspirin , OVER THE COUNTER MEDICATION, sildenafil , anastrozole , allopurinol , atorvastatin , lisinopril , fluticasone , omeprazole , tadalafil , testosterone  cypionate, propranolol , zolpidem , and predniSONE .  Meds ordered this encounter  Medications   DISCONTD: predniSONE  (DELTASONE ) 10 MG tablet    Sig: Take 4 tablets (40 mg total) by mouth daily with breakfast for 2 days, THEN 2 tablets (20 mg total) daily with breakfast for 2 days, THEN 1 tablet (10 mg total)  daily with breakfast for 2 days.    Dispense:  14 tablet    Refill:  0    Supervising Provider:   DOMENICA BLACKBIRD A [[id number]]   predniSONE  (DELTASONE ) 10 MG tablet    Sig: Take 4 tablets (40 mg total) by mouth daily with breakfast for 2 days, THEN 2 tablets (20 mg total) daily with breakfast for 2 days, THEN 1 tablet (10 mg total) daily with breakfast for 2 days.    Dispense:  14 tablet    Refill:  0    Supervising Provider:   DOMENICA,  STACEY A [[id number]]

## 2024-03-25 NOTE — Assessment & Plan Note (Signed)
 Patient encouraged to maintain heart healthy diet, regular exercise, adequate sleep. Consider daily probiotics. Take medications as prescribed. Labs ordered and reviewed

## 2024-03-25 NOTE — Assessment & Plan Note (Signed)
 Hydrate and monitor.  Reports no recent flares.  Check uric acid level today.

## 2024-03-25 NOTE — Assessment & Plan Note (Addendum)
 Stable on current medications.  Counseled on lifestyle modifications including avoiding trigger foods (spicy, fatty, acidic), eating smaller meals, not lying down within 2-3 hours of eating.

## 2024-03-25 NOTE — Assessment & Plan Note (Signed)
 Tolerating statin, encouraged heart healthy diet, avoid trans fats, minimize simple carbs and saturated fats. Increase exercise as tolerated

## 2024-03-25 NOTE — Assessment & Plan Note (Signed)
 Supplement and monitor

## 2024-03-25 NOTE — Assessment & Plan Note (Addendum)
 Well controlled on lisinopril  20 mg daily, no changes to meds. Encouraged heart healthy diet such as the DASH diet and exercise as tolerated.

## 2024-03-30 ENCOUNTER — Encounter: Admitting: Physician Assistant

## 2024-03-31 ENCOUNTER — Encounter: Payer: Self-pay | Admitting: Student

## 2024-03-31 ENCOUNTER — Ambulatory Visit: Payer: Self-pay | Admitting: Student

## 2024-03-31 ENCOUNTER — Other Ambulatory Visit (HOSPITAL_BASED_OUTPATIENT_CLINIC_OR_DEPARTMENT_OTHER): Payer: Self-pay

## 2024-03-31 ENCOUNTER — Ambulatory Visit (INDEPENDENT_AMBULATORY_CARE_PROVIDER_SITE_OTHER): Admitting: Student

## 2024-03-31 VITALS — BP 128/82 | HR 71 | Temp 97.7°F | Resp 12 | Ht 74.0 in | Wt 198.6 lb

## 2024-03-31 DIAGNOSIS — H6121 Impacted cerumen, right ear: Secondary | ICD-10-CM | POA: Diagnosis not present

## 2024-03-31 DIAGNOSIS — G4700 Insomnia, unspecified: Secondary | ICD-10-CM

## 2024-03-31 DIAGNOSIS — M1A9XX0 Chronic gout, unspecified, without tophus (tophi): Secondary | ICD-10-CM | POA: Diagnosis not present

## 2024-03-31 DIAGNOSIS — N529 Male erectile dysfunction, unspecified: Secondary | ICD-10-CM

## 2024-03-31 DIAGNOSIS — E875 Hyperkalemia: Secondary | ICD-10-CM

## 2024-03-31 DIAGNOSIS — G8929 Other chronic pain: Secondary | ICD-10-CM

## 2024-03-31 DIAGNOSIS — Z0000 Encounter for general adult medical examination without abnormal findings: Secondary | ICD-10-CM

## 2024-03-31 DIAGNOSIS — Z79899 Other long term (current) drug therapy: Secondary | ICD-10-CM | POA: Diagnosis not present

## 2024-03-31 DIAGNOSIS — E559 Vitamin D deficiency, unspecified: Secondary | ICD-10-CM | POA: Diagnosis not present

## 2024-03-31 DIAGNOSIS — M545 Low back pain, unspecified: Secondary | ICD-10-CM

## 2024-03-31 DIAGNOSIS — K219 Gastro-esophageal reflux disease without esophagitis: Secondary | ICD-10-CM

## 2024-03-31 DIAGNOSIS — I10 Essential (primary) hypertension: Secondary | ICD-10-CM | POA: Diagnosis not present

## 2024-03-31 DIAGNOSIS — E782 Mixed hyperlipidemia: Secondary | ICD-10-CM

## 2024-03-31 LAB — CBC WITH DIFFERENTIAL/PLATELET
Basophils Absolute: 0 K/uL (ref 0.0–0.1)
Basophils Relative: 0.6 % (ref 0.0–3.0)
Eosinophils Absolute: 0.3 K/uL (ref 0.0–0.7)
Eosinophils Relative: 3.4 % (ref 0.0–5.0)
HCT: 52.8 % — ABNORMAL HIGH (ref 39.0–52.0)
Hemoglobin: 17.4 g/dL — ABNORMAL HIGH (ref 13.0–17.0)
Lymphocytes Relative: 30.1 % (ref 12.0–46.0)
Lymphs Abs: 2.4 K/uL (ref 0.7–4.0)
MCHC: 32.9 g/dL (ref 30.0–36.0)
MCV: 85.1 fl (ref 78.0–100.0)
Monocytes Absolute: 0.9 K/uL (ref 0.1–1.0)
Monocytes Relative: 11.3 % (ref 3.0–12.0)
Neutro Abs: 4.3 K/uL (ref 1.4–7.7)
Neutrophils Relative %: 54.6 % (ref 43.0–77.0)
Platelets: 221 K/uL (ref 150.0–400.0)
RBC: 6.21 Mil/uL — ABNORMAL HIGH (ref 4.22–5.81)
RDW: 16 % — ABNORMAL HIGH (ref 11.5–15.5)
WBC: 7.9 K/uL (ref 4.0–10.5)

## 2024-03-31 LAB — VITAMIN D 25 HYDROXY (VIT D DEFICIENCY, FRACTURES): VITD: 88.16 ng/mL (ref 30.00–100.00)

## 2024-03-31 LAB — COMPREHENSIVE METABOLIC PANEL WITH GFR
ALT: 29 U/L (ref 0–53)
AST: 24 U/L (ref 0–37)
Albumin: 4.4 g/dL (ref 3.5–5.2)
Alkaline Phosphatase: 43 U/L (ref 39–117)
BUN: 20 mg/dL (ref 6–23)
CO2: 31 meq/L (ref 19–32)
Calcium: 9.6 mg/dL (ref 8.4–10.5)
Chloride: 98 meq/L (ref 96–112)
Creatinine, Ser: 1.3 mg/dL (ref 0.40–1.50)
GFR: 56 mL/min — ABNORMAL LOW (ref >60.00)
Glucose, Bld: 107 mg/dL — ABNORMAL HIGH (ref 70–99)
Potassium: 5.6 meq/L — ABNORMAL HIGH (ref 3.5–5.1)
Sodium: 134 meq/L — ABNORMAL LOW (ref 135–145)
Total Bilirubin: 1 mg/dL (ref 0.2–1.2)
Total Protein: 6.8 g/dL (ref 6.0–8.3)

## 2024-03-31 LAB — LIPID PANEL
Cholesterol: 159 mg/dL (ref 0–200)
HDL: 52.1 mg/dL (ref >39.00)
LDL Cholesterol: 85 mg/dL (ref 0–99)
NonHDL: 106.83
Total CHOL/HDL Ratio: 3
Triglycerides: 109 mg/dL (ref 0.0–149.0)
VLDL: 21.8 mg/dL (ref 0.0–40.0)

## 2024-03-31 LAB — TSH: TSH: 0.54 u[IU]/mL (ref 0.35–5.50)

## 2024-03-31 LAB — URIC ACID: Uric Acid, Serum: 5.7 mg/dL (ref 4.0–7.8)

## 2024-03-31 MED ORDER — PREDNISONE 10 MG PO TABS
ORAL_TABLET | ORAL | 0 refills | Status: DC
  Filled 2024-03-31: qty 14, 6d supply, fill #0

## 2024-03-31 MED ORDER — PREDNISONE 10 MG PO TABS
ORAL_TABLET | ORAL | 0 refills | Status: AC
  Filled 2024-03-31: qty 14, 6d supply, fill #0

## 2024-03-31 NOTE — Assessment & Plan Note (Signed)
 Acute flare, Rx- prednisone  taper. Encouraged moist heat and gentle stretching as tolerated. May try NSAIDs and prescription meds as directed and report if symptoms worsen or seek immediate care.

## 2024-03-31 NOTE — Assessment & Plan Note (Signed)
 Follows with Urology- Dr. Sherrilee.

## 2024-03-31 NOTE — Addendum Note (Signed)
 Addended by: ESTELLE GILLIS D on: 03/31/2024 09:10 AM   Modules accepted: Orders

## 2024-03-31 NOTE — Assessment & Plan Note (Signed)
 TM WNL post irrigation. Advise OTC Debrox as needed to prevent cerumen impactions.

## 2024-04-02 ENCOUNTER — Other Ambulatory Visit (HOSPITAL_BASED_OUTPATIENT_CLINIC_OR_DEPARTMENT_OTHER): Payer: Self-pay

## 2024-04-02 LAB — DRUG MONITORING PANEL 376104, URINE
Amphetamines: NEGATIVE ng/mL (ref <500)
Barbiturates: NEGATIVE ng/mL (ref <300)
Benzodiazepines: NEGATIVE ng/mL (ref <100)
Cocaine Metabolite: NEGATIVE ng/mL (ref <150)
Desmethyltramadol: NEGATIVE ng/mL (ref <100)
Opiates: NEGATIVE ng/mL (ref <100)
Oxycodone: NEGATIVE ng/mL (ref <100)
Tramadol: NEGATIVE ng/mL (ref <100)

## 2024-04-02 LAB — DM TEMPLATE

## 2024-04-12 ENCOUNTER — Other Ambulatory Visit (INDEPENDENT_AMBULATORY_CARE_PROVIDER_SITE_OTHER)

## 2024-04-12 DIAGNOSIS — E875 Hyperkalemia: Secondary | ICD-10-CM

## 2024-04-12 LAB — COMPREHENSIVE METABOLIC PANEL WITH GFR
ALT: 29 U/L (ref 0–53)
AST: 24 U/L (ref 0–37)
Albumin: 4.3 g/dL (ref 3.5–5.2)
Alkaline Phosphatase: 40 U/L (ref 39–117)
BUN: 18 mg/dL (ref 6–23)
CO2: 28 meq/L (ref 19–32)
Calcium: 9.6 mg/dL (ref 8.4–10.5)
Chloride: 98 meq/L (ref 96–112)
Creatinine, Ser: 1.17 mg/dL (ref 0.40–1.50)
GFR: 63.53 mL/min (ref >60.00)
Glucose, Bld: 93 mg/dL (ref 70–99)
Potassium: 4.7 meq/L (ref 3.5–5.1)
Sodium: 133 meq/L — ABNORMAL LOW (ref 135–145)
Total Bilirubin: 1.2 mg/dL (ref 0.2–1.2)
Total Protein: 6.4 g/dL (ref 6.0–8.3)

## 2024-04-13 ENCOUNTER — Ambulatory Visit: Payer: Self-pay | Admitting: Student

## 2024-04-16 DIAGNOSIS — D3131 Benign neoplasm of right choroid: Secondary | ICD-10-CM | POA: Diagnosis not present

## 2024-04-16 DIAGNOSIS — H5203 Hypermetropia, bilateral: Secondary | ICD-10-CM | POA: Diagnosis not present

## 2024-05-03 ENCOUNTER — Other Ambulatory Visit: Payer: Self-pay

## 2024-05-03 ENCOUNTER — Other Ambulatory Visit (HOSPITAL_BASED_OUTPATIENT_CLINIC_OR_DEPARTMENT_OTHER): Payer: Self-pay

## 2024-05-03 ENCOUNTER — Other Ambulatory Visit: Payer: Self-pay | Admitting: Family Medicine

## 2024-05-03 MED ORDER — OMEPRAZOLE 20 MG PO CPDR
20.0000 mg | DELAYED_RELEASE_CAPSULE | Freq: Every day | ORAL | 0 refills | Status: DC
  Filled 2024-05-03: qty 90, 90d supply, fill #0

## 2024-05-12 ENCOUNTER — Other Ambulatory Visit (HOSPITAL_BASED_OUTPATIENT_CLINIC_OR_DEPARTMENT_OTHER): Payer: Self-pay

## 2024-06-16 ENCOUNTER — Other Ambulatory Visit (HOSPITAL_BASED_OUTPATIENT_CLINIC_OR_DEPARTMENT_OTHER): Payer: Self-pay

## 2024-06-16 ENCOUNTER — Other Ambulatory Visit: Payer: Self-pay | Admitting: Family Medicine

## 2024-06-16 DIAGNOSIS — E782 Mixed hyperlipidemia: Secondary | ICD-10-CM

## 2024-06-16 MED ORDER — ATORVASTATIN CALCIUM 10 MG PO TABS
10.0000 mg | ORAL_TABLET | Freq: Every day | ORAL | 1 refills | Status: AC
  Filled 2024-06-16: qty 90, 90d supply, fill #0

## 2024-06-27 ENCOUNTER — Other Ambulatory Visit: Payer: Self-pay | Admitting: Family Medicine

## 2024-06-28 ENCOUNTER — Other Ambulatory Visit: Payer: Self-pay

## 2024-06-28 ENCOUNTER — Other Ambulatory Visit (HOSPITAL_BASED_OUTPATIENT_CLINIC_OR_DEPARTMENT_OTHER): Payer: Self-pay

## 2024-06-28 MED ORDER — FLUTICASONE PROPIONATE 50 MCG/ACT NA SUSP
2.0000 | Freq: Every day | NASAL | 1 refills | Status: AC
  Filled 2024-06-28: qty 48, 90d supply, fill #0
  Filled 2024-09-01: qty 48, 90d supply, fill #1

## 2024-06-29 ENCOUNTER — Other Ambulatory Visit: Payer: Self-pay | Admitting: Family Medicine

## 2024-06-29 ENCOUNTER — Other Ambulatory Visit (HOSPITAL_BASED_OUTPATIENT_CLINIC_OR_DEPARTMENT_OTHER): Payer: Self-pay

## 2024-06-29 ENCOUNTER — Other Ambulatory Visit: Payer: Self-pay

## 2024-06-29 MED ORDER — LISINOPRIL 20 MG PO TABS
20.0000 mg | ORAL_TABLET | Freq: Every day | ORAL | 1 refills | Status: AC
  Filled 2024-06-29: qty 90, 90d supply, fill #0

## 2024-07-05 ENCOUNTER — Other Ambulatory Visit: Payer: Self-pay

## 2024-07-05 ENCOUNTER — Other Ambulatory Visit (HOSPITAL_BASED_OUTPATIENT_CLINIC_OR_DEPARTMENT_OTHER): Payer: Self-pay

## 2024-07-05 DIAGNOSIS — Z85828 Personal history of other malignant neoplasm of skin: Secondary | ICD-10-CM | POA: Diagnosis not present

## 2024-07-05 DIAGNOSIS — L570 Actinic keratosis: Secondary | ICD-10-CM | POA: Diagnosis not present

## 2024-07-05 DIAGNOSIS — D225 Melanocytic nevi of trunk: Secondary | ICD-10-CM | POA: Diagnosis not present

## 2024-07-05 DIAGNOSIS — L814 Other melanin hyperpigmentation: Secondary | ICD-10-CM | POA: Diagnosis not present

## 2024-07-05 DIAGNOSIS — L821 Other seborrheic keratosis: Secondary | ICD-10-CM | POA: Diagnosis not present

## 2024-07-05 MED ORDER — FLUOROURACIL 5 % EX CREA
1.0000 | TOPICAL_CREAM | Freq: Every evening | CUTANEOUS | 0 refills | Status: AC
  Filled 2024-07-05: qty 40, 15d supply, fill #0

## 2024-07-27 ENCOUNTER — Other Ambulatory Visit (HOSPITAL_BASED_OUTPATIENT_CLINIC_OR_DEPARTMENT_OTHER): Payer: Self-pay

## 2024-08-06 ENCOUNTER — Other Ambulatory Visit (HOSPITAL_BASED_OUTPATIENT_CLINIC_OR_DEPARTMENT_OTHER): Payer: Self-pay

## 2024-08-08 ENCOUNTER — Other Ambulatory Visit: Payer: Self-pay

## 2024-08-09 ENCOUNTER — Other Ambulatory Visit (HOSPITAL_BASED_OUTPATIENT_CLINIC_OR_DEPARTMENT_OTHER): Payer: Self-pay

## 2024-08-10 ENCOUNTER — Other Ambulatory Visit

## 2024-08-11 LAB — COMPREHENSIVE METABOLIC PANEL WITH GFR
ALT: 28 IU/L (ref 0–44)
AST: 26 IU/L (ref 0–40)
Albumin: 4.4 g/dL (ref 3.9–4.9)
Alkaline Phosphatase: 61 IU/L (ref 47–123)
BUN/Creatinine Ratio: 12 (ref 10–24)
BUN: 17 mg/dL (ref 8–27)
Bilirubin Total: 0.8 mg/dL (ref 0.0–1.2)
CO2: 22 mmol/L (ref 20–29)
Calcium: 9.8 mg/dL (ref 8.6–10.2)
Chloride: 97 mmol/L (ref 96–106)
Creatinine, Ser: 1.37 mg/dL — ABNORMAL HIGH (ref 0.76–1.27)
Globulin, Total: 2.2 g/dL (ref 1.5–4.5)
Glucose: 114 mg/dL — ABNORMAL HIGH (ref 70–99)
Potassium: 6 mmol/L — ABNORMAL HIGH (ref 3.5–5.2)
Sodium: 136 mmol/L (ref 134–144)
Total Protein: 6.6 g/dL (ref 6.0–8.5)
eGFR: 55 mL/min/1.73 — ABNORMAL LOW

## 2024-08-11 LAB — PSA, TOTAL AND FREE
PSA, Free Pct: 27.9 %
PSA, Free: 0.67 ng/mL
Prostate Specific Ag, Serum: 2.4 ng/mL (ref 0.0–4.0)

## 2024-08-11 LAB — CBC
Hematocrit: 55.3 % — ABNORMAL HIGH (ref 37.5–51.0)
Hemoglobin: 18.3 g/dL — ABNORMAL HIGH (ref 13.0–17.7)
MCH: 29.6 pg (ref 26.6–33.0)
MCHC: 33.1 g/dL (ref 31.5–35.7)
MCV: 90 fL (ref 79–97)
Platelets: 224 x10E3/uL (ref 150–450)
RBC: 6.18 x10E6/uL — ABNORMAL HIGH (ref 4.14–5.80)
RDW: 13.4 % (ref 11.6–15.4)
WBC: 8 x10E3/uL (ref 3.4–10.8)

## 2024-08-12 ENCOUNTER — Other Ambulatory Visit: Payer: Self-pay | Admitting: Family Medicine

## 2024-08-12 ENCOUNTER — Other Ambulatory Visit (HOSPITAL_BASED_OUTPATIENT_CLINIC_OR_DEPARTMENT_OTHER): Payer: Self-pay

## 2024-08-12 LAB — TESTOSTERONE,FREE AND TOTAL
Testosterone, Free: 8.2 pg/mL (ref 6.6–18.1)
Testosterone: 386 ng/dL (ref 264–916)

## 2024-08-12 MED ORDER — OMEPRAZOLE 20 MG PO CPDR
20.0000 mg | DELAYED_RELEASE_CAPSULE | Freq: Every day | ORAL | 0 refills | Status: AC
  Filled 2024-08-12: qty 90, 90d supply, fill #0

## 2024-08-18 ENCOUNTER — Other Ambulatory Visit: Payer: Self-pay

## 2024-08-18 ENCOUNTER — Ambulatory Visit: Admitting: Urology

## 2024-08-18 ENCOUNTER — Other Ambulatory Visit (HOSPITAL_BASED_OUTPATIENT_CLINIC_OR_DEPARTMENT_OTHER): Payer: Self-pay

## 2024-08-18 ENCOUNTER — Encounter: Payer: Self-pay | Admitting: Urology

## 2024-08-18 VITALS — BP 136/83 | HR 66

## 2024-08-18 DIAGNOSIS — E291 Testicular hypofunction: Secondary | ICD-10-CM

## 2024-08-18 DIAGNOSIS — N5201 Erectile dysfunction due to arterial insufficiency: Secondary | ICD-10-CM | POA: Diagnosis not present

## 2024-08-18 MED ORDER — TESTOSTERONE CYPIONATE 200 MG/ML IM SOLN
200.0000 mg | INTRAMUSCULAR | 3 refills | Status: AC
  Filled 2024-08-18: qty 10, 140d supply, fill #0

## 2024-08-18 MED ORDER — ANASTROZOLE 1 MG PO TABS
0.5000 mg | ORAL_TABLET | Freq: Every day | ORAL | 3 refills | Status: AC
  Filled 2024-08-18: qty 45, 90d supply, fill #0

## 2024-08-18 MED ORDER — SILDENAFIL CITRATE 100 MG PO TABS: 100.0000 mg | ORAL_TABLET | Freq: Every day | ORAL | 0 refills | Status: AC | PRN

## 2024-08-18 NOTE — Patient Instructions (Signed)
 Hypogonadism, Male  Male hypogonadism is a condition of having a level of testosterone  that is lower than normal. Testosterone  is a chemical, or hormone, that is made mainly in the testicles. In boys, testosterone  is responsible for the development of male characteristics during puberty. These include: Making the penis bigger. Growing and building the muscles. Growing facial hair. Deepening the voice. In adult men, testosterone  is responsible for maintaining: An interest in sex and the ability to have sex. Muscle mass. Sperm production. Red blood cell production. Bone strength. Testosterone  also gives men energy and a sense of well-being. Testosterone  normally decreases as men age and the testicles make less testosterone . Testosterone  levels can vary from man to man. Not all men will have signs and symptoms of low testosterone . Weight, alcohol use, medicines, and certain medical conditions can affect a man's testosterone  level. What are the causes? This condition is caused by: A natural decrease in testosterone  that occurs as a man grows older. This is the main cause of this condition. Use of medicines, such as antidepressants, steroids, and opioids. Diseases and conditions that affect the testicles or the making of testosterone . These include: Injury or damage to the testicles from trauma, cancer, cancer treatment, or infection. Diabetes. Sleep apnea. Genetic conditions that men are born with. Disease of the pituitary gland. This gland is in the brain. It produces hormones. Obesity. Metabolic syndrome. This is a group of diseases that affect blood pressure, blood sugar, cholesterol, and belly fat. HIV or AIDS. Alcohol abuse. Kidney failure. Other long-term or chronic diseases. What are the signs or symptoms? Common symptoms of this condition include: Loss of interest in sex (low sex drive). Inability to have or maintain an erection (erectile dysfunction). Feeling tired  (fatigue). Mood changes, like irritability or depression. Loss of muscle and body hair. Infertility. Large breasts. Weight gain (obesity). How is this diagnosed? Your health care provider can diagnose hypogonadism based on: Your signs and symptoms. A physical exam to check your testosterone  levels. This includes blood tests. Testosterone  levels can change throughout the day. Levels are highest in the morning. You may need to have repeat blood tests before getting a diagnosis of hypogonadism. Depending on your medical history and test results, your health care provider may also do other tests to find the cause of low testosterone . How is this treated? This condition is treated with testosterone  replacement therapy. Testosterone  can be given by: Injection or through pellets inserted under the skin. Gels or patches placed on the skin or in the mouth. Testosterone  therapy is not for everyone. It has risks and side effects. Your health care provider will consider your medical history, your risk for prostate cancer, your age, and your symptoms before putting you on testosterone  replacement therapy. Follow these instructions at home: Take over-the-counter and prescription medicines only as told by your health care provider. Eat foods that are high in fiber, such as beans, whole grains, and fresh fruits and vegetables. Limit foods that are high in fat and processed sugars, such as fried or sweet foods. If you drink alcohol: Limit how much you have to 0-2 drinks a day. Know how much alcohol is in your drink. In the [place] one drink equals one 12 oz bottle of beer (355 mL), one 5 oz glass of wine (148 mL), or one 1 oz glass of hard liquor (44 mL). Return to your normal activities as told by your health care provider. Ask your health care provider what activities are safe for you. Keep all  follow-up visits. This is important. Contact a health care provider if: You have any of the signs or symptoms of  low testosterone . You have any side effects from testosterone  therapy. Summary Male hypogonadism is a condition of having a level of testosterone  that is lower than normal. The natural drop in testosterone  production that occurs with age is the most common cause of this condition. Low testosterone  can also be caused by many diseases and conditions that affect the testicles and the making of testosterone . This condition is treated with testosterone  replacement therapy. There are risks and side effects of testosterone  therapy. Your health care provider will consider your age, medical history, symptoms, and risks for prostate cancer before putting you on testosterone  therapy. This information is not intended to replace advice given to you by your health care provider. Make sure you discuss any questions you have with your health care provider. Document Revised: 06/23/2023 Document Reviewed: 06/23/2023 Elsevier Patient Education  2025 [employer].

## 2024-08-18 NOTE — Progress Notes (Unsigned)
 "  08/18/2024 12:15 PM   Taylor Mcdonald 04-12-1956 [id number]  Referring provider: Domenica Harlene LABOR, MD [address], [address] [place],  [place] [address]  Followup hypogonadism   HPI: [name] is a 69yo here for followup for hypogonadism and erectile dysfunction. Testosterone  386, hemoglobin 18.3. creatinine 1.37. He injects 200mg  IM testosterone  every 2 weeks. He is on anastrazole 0.5mg  daily. PSA 2.4. IPSS 6 QOL 1. Urine stream strong. No straining to urinate. Nocturia 1x. He uses sildenafil  60mg  prn with good results  PMH: Past Medical History:  Diagnosis Date   Allergy    Anxiety 03/04/2012   Fatigue    GERD (gastroesophageal reflux disease)    Gout    Hyperglycemia    Hyperlipidemia    Hypertension    Insomnia    Internal nasal lesion 07/04/2016   Low testosterone     Lower back pain 09/09/2012   Migraine    Pain of left heel 10/01/2011   Preventative health care 07/24/2012   RLS (restless legs syndrome)    Sinusitis 03/04/2012   Varicose veins of both lower extremities 12/12/2016   Vasovagal near syncope 04/30/2013   Vitamin D  deficiency 12/23/2011    Surgical History: Past Surgical History:  Procedure Laterality Date   LUMBAR LAMINECTOMY/DECOMPRESSION MICRODISCECTOMY N/A 05/13/2013   Procedure: LUMBAR LAMINECTOMY/DECOMPRESSION MICRODISCECTOMY 1 LEVEL L3-4;  Surgeon: Reyes JAYSON Billing, MD;  Location: [facility];  Service: Orthopedics;  Laterality: N/A;   PANENDOSCOPY     TONSILLECTOMY      Home Medications:  Allergies as of 08/18/2024   No Known Allergies      Medication List        Accurate as of August 18, 2024 12:15 PM. If you have any questions, ask your nurse or doctor.          allopurinol  100 MG tablet Commonly known as: ZYLOPRIM  Take 2 tablets (200 mg total) by mouth daily.   anastrozole  1 MG tablet Commonly known as: ARIMIDEX  Take 1/2 tablet (0.5 mg total) by mouth daily.   aspirin  81 MG tablet Take 1 tablet (81 mg total) by mouth daily.  Resume 4 days post-op   atorvastatin  10 MG tablet Commonly known as: LIPITOR Take 1 tablet (10 mg total) by mouth daily.   Fish Oil + D3 1200-1000 MG-UNIT Caps Take 1 capsule by mouth daily.   fluorouracil  5 % cream Commonly known as: EFUDEX  Apply 1 Application topically at bedtime for 2 weeks, then stop.   fluticasone  50 MCG/ACT nasal spray Commonly known as: FLONASE  Place 2 sprays into both nostrils daily.   lisinopril  20 MG tablet Commonly known as: ZESTRIL  Take 1 tablet (20 mg total) by mouth daily.   omeprazole  20 MG capsule Commonly known as: PRILOSEC Take 1 capsule (20 mg total) by mouth daily.   OVER THE COUNTER MEDICATION 12 hr decongestant 120mg    propranolol  40 MG tablet Commonly known as: INDERAL  Take 1 tablet (40 mg total) by mouth daily.   sildenafil  20 MG tablet Commonly known as: REVATIO  Take 1 tablet (20 mg total) by mouth as needed.   tadalafil  20 MG tablet Commonly known as: CIALIS  Take 1 tablet (20 mg total) by mouth as needed.   testosterone  cypionate 200 MG/ML injection Commonly known as: DEPOTESTOSTERONE CYPIONATE Inject 1 mL (200 mg total) into the skin every 14 (fourteen) days.   zolpidem  10 MG tablet Commonly known as: AMBIEN  Take 0.5-1 tablets (5-10 mg total) by mouth at bedtime as needed.  Allergies: Allergies[1]  Family History: Family History  Problem Relation Age of Onset   Obesity Mother    Hypertension Son    Hypertension Maternal Grandmother    Obesity Maternal Grandmother    Cancer Maternal Grandmother 68       intestinal   Dementia Maternal Grandfather    Colon cancer Neg Hx    Esophageal cancer Neg Hx    Rectal cancer Neg Hx    Stomach cancer Neg Hx     Social History:  reports that he has never smoked. He has never used smokeless tobacco. He reports that he does not currently use drugs after having used the following drugs: Hydrocodone . He reports that he does not drink alcohol.  ROS: All other review  of systems were reviewed and are negative except what is noted above in HPI  Physical Exam: BP 136/83   Pulse 66   Constitutional:  Alert and oriented, No acute distress. HEENT: [place] AT, moist mucus membranes.  Trachea midline, no masses. Cardiovascular: No clubbing, cyanosis, or edema. Respiratory: Normal respiratory effort, no increased work of breathing. GI: Abdomen is soft, nontender, nondistended, no abdominal masses GU: No CVA tenderness.  Lymph: No cervical or inguinal lymphadenopathy. Skin: No rashes, bruises or suspicious lesions. Neurologic: Grossly intact, no focal deficits, moving all 4 extremities. Psychiatric: Normal mood and affect.  Laboratory Data: Lab Results  Component Value Date   WBC 8.0 08/10/2024   HGB 18.3 (H) 08/10/2024   HCT 55.3 (H) 08/10/2024   MCV 90 08/10/2024   PLT 224 08/10/2024    Lab Results  Component Value Date   CREATININE 1.37 (H) 08/10/2024    Lab Results  Component Value Date   PSA 0.64 12/25/2017   PSA 0.63 11/23/2015   PSA 0.50 08/25/2014    Lab Results  Component Value Date   TESTOSTERONE  386 08/10/2024    Lab Results  Component Value Date   HGBA1C 5.7 10/02/2023    Urinalysis    Component Value Date/Time   COLORURINE YELLOW 12/25/2017 1037   APPEARANCEUR Clear 02/13/2024 1137   LABSPEC 1.010 12/25/2017 1037   PHURINE 6.0 12/25/2017 1037   GLUCOSEU Negative 02/13/2024 1137   GLUCOSEU NEGATIVE 12/25/2017 1037   HGBUR NEGATIVE 12/25/2017 1037   HGBUR trace-intact 07/25/2009 0000   BILIRUBINUR Negative 02/13/2024 1137   KETONESUR NEGATIVE 12/25/2017 1037   PROTEINUR Negative 02/13/2024 1137   UROBILINOGEN 0.2 12/25/2017 1037   NITRITE Negative 02/13/2024 1137   NITRITE NEGATIVE 12/25/2017 1037   LEUKOCYTESUR Negative 02/13/2024 1137    Lab Results  Component Value Date   LABMICR Comment 02/13/2024    Pertinent Imaging:  No results found for this or any previous visit.  No results found for this or any  previous visit.  No results found for this or any previous visit.  No results found for this or any previous visit.  No results found for this or any previous visit.  No results found for this or any previous visit.  No results found for this or any previous visit.  No results found for this or any previous visit.   Assessment & Plan:    1. Hypogonadism in male (Primary) Followup 6 months with testosterone  labs Continue IM testosteroen 200mg  every 2 weeks. Continue anastrozole  0.5mg  daily -patient instyructed to donate blood to lower hemoglobin  2. Erectile dysfunction due to arterial insufficiency Sildenafil  50-100mg  prn   No follow-ups on file.  Belvie Clara, MD  [facility] [place]       [  1] No Known Allergies  "

## 2024-08-19 ENCOUNTER — Other Ambulatory Visit (HOSPITAL_COMMUNITY): Payer: Self-pay

## 2024-08-20 ENCOUNTER — Other Ambulatory Visit (HOSPITAL_BASED_OUTPATIENT_CLINIC_OR_DEPARTMENT_OTHER): Payer: Self-pay

## 2024-08-23 ENCOUNTER — Other Ambulatory Visit (HOSPITAL_COMMUNITY): Payer: Self-pay

## 2024-09-01 ENCOUNTER — Other Ambulatory Visit: Payer: Self-pay

## 2024-09-01 ENCOUNTER — Other Ambulatory Visit (HOSPITAL_BASED_OUTPATIENT_CLINIC_OR_DEPARTMENT_OTHER): Payer: Self-pay

## 2024-09-09 ENCOUNTER — Ambulatory Visit: Admitting: Student

## 2024-09-09 ENCOUNTER — Ambulatory Visit: Admitting: Family Medicine

## 2024-09-10 ENCOUNTER — Ambulatory Visit

## 2024-12-15 ENCOUNTER — Encounter: Admitting: Medical

## 2025-02-15 ENCOUNTER — Other Ambulatory Visit

## 2025-03-02 ENCOUNTER — Ambulatory Visit: Admitting: Urology
